# Patient Record
Sex: Male | Born: 1970 | Race: Black or African American | Hispanic: No | Marital: Single | Smoking: Never smoker
Health system: Southern US, Community
[De-identification: ages and names within clinical notes are randomized; demographics above are authoritative.]

## PROBLEM LIST (undated history)

## (undated) DIAGNOSIS — F32A Depression, unspecified: Secondary | ICD-10-CM

## (undated) DIAGNOSIS — I739 Peripheral vascular disease, unspecified: Secondary | ICD-10-CM

## (undated) DIAGNOSIS — G3184 Mild cognitive impairment, so stated: Secondary | ICD-10-CM

## (undated) DIAGNOSIS — N289 Disorder of kidney and ureter, unspecified: Secondary | ICD-10-CM

## (undated) DIAGNOSIS — I10 Essential (primary) hypertension: Secondary | ICD-10-CM

## (undated) DIAGNOSIS — F329 Major depressive disorder, single episode, unspecified: Secondary | ICD-10-CM

## (undated) DIAGNOSIS — I639 Cerebral infarction, unspecified: Secondary | ICD-10-CM

## (undated) HISTORY — PX: ABSCESS DRAINAGE: SHX1119

---
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
## 2001-10-04 ENCOUNTER — Inpatient Hospital Stay (HOSPITAL_COMMUNITY): Admission: EM | Admit: 2001-10-04 | Discharge: 2001-10-06 | Payer: Self-pay | Admitting: Emergency Medicine

## 2001-10-17 ENCOUNTER — Encounter: Admission: RE | Admit: 2001-10-17 | Discharge: 2001-10-17 | Payer: Self-pay | Admitting: Family Medicine

## 2001-11-05 ENCOUNTER — Emergency Department (HOSPITAL_COMMUNITY): Admission: EM | Admit: 2001-11-05 | Discharge: 2001-11-05 | Payer: Self-pay | Admitting: Emergency Medicine

## 2005-02-02 ENCOUNTER — Emergency Department (HOSPITAL_COMMUNITY): Admission: EM | Admit: 2005-02-02 | Discharge: 2005-02-02 | Payer: Self-pay | Admitting: Emergency Medicine

## 2005-05-15 ENCOUNTER — Emergency Department (HOSPITAL_COMMUNITY): Admission: EM | Admit: 2005-05-15 | Discharge: 2005-05-15 | Payer: Self-pay | Admitting: Family Medicine

## 2010-04-21 ENCOUNTER — Inpatient Hospital Stay (HOSPITAL_COMMUNITY)
Admission: EM | Admit: 2010-04-21 | Discharge: 2010-04-25 | DRG: 603 | Disposition: A | Discharge status: Home Health | Payer: Self-pay | Attending: Internal Medicine | Admitting: Internal Medicine

## 2010-04-21 DIAGNOSIS — I10 Essential (primary) hypertension: Secondary | ICD-10-CM | POA: Diagnosis present

## 2010-04-21 DIAGNOSIS — L02419 Cutaneous abscess of limb, unspecified: Principal | ICD-10-CM | POA: Diagnosis present

## 2010-04-21 DIAGNOSIS — E871 Hypo-osmolality and hyponatremia: Secondary | ICD-10-CM | POA: Diagnosis present

## 2010-04-21 DIAGNOSIS — IMO0001 Reserved for inherently not codable concepts without codable children: Secondary | ICD-10-CM | POA: Diagnosis present

## 2010-04-21 DIAGNOSIS — Z6841 Body Mass Index (BMI) 40.0 and over, adult: Secondary | ICD-10-CM

## 2010-04-21 LAB — POCT I-STAT, CHEM 8
BUN: 12 mg/dL (ref 6–23)
Calcium, Ion: 0.99 mmol/L — ABNORMAL LOW (ref 1.12–1.32)
Chloride: 99 mEq/L (ref 96–112)
Glucose, Bld: 257 mg/dL — ABNORMAL HIGH (ref 70–99)
HCT: 46 % (ref 39.0–52.0)
Potassium: 4.6 mEq/L (ref 3.5–5.1)

## 2010-04-21 LAB — GLUCOSE, CAPILLARY

## 2010-04-21 LAB — CBC
HCT: 42.1 % (ref 39.0–52.0)
Hemoglobin: 13.9 g/dL (ref 13.0–17.0)
MCV: 86.4 fL (ref 78.0–100.0)
RBC: 4.87 MIL/uL (ref 4.22–5.81)
RDW: 13.7 % (ref 11.5–15.5)
WBC: 19.1 10*3/uL — ABNORMAL HIGH (ref 4.0–10.5)

## 2010-04-21 LAB — DIFFERENTIAL
Basophils Absolute: 0.1 10*3/uL (ref 0.0–0.1)
Eosinophils Relative: 0 % (ref 0–5)
Lymphocytes Relative: 8 % — ABNORMAL LOW (ref 12–46)
Lymphs Abs: 1.5 10*3/uL (ref 0.7–4.0)
Neutro Abs: 16 10*3/uL — ABNORMAL HIGH (ref 1.7–7.7)
Neutrophils Relative %: 84 % — ABNORMAL HIGH (ref 43–77)

## 2010-04-22 LAB — CBC
HCT: 38.7 % — ABNORMAL LOW (ref 39.0–52.0)
Hemoglobin: 13 g/dL (ref 13.0–17.0)
MCH: 28.7 pg (ref 26.0–34.0)
MCHC: 33.6 g/dL (ref 30.0–36.0)
MCV: 85.4 fL (ref 78.0–100.0)
Platelets: 268 10*3/uL (ref 150–400)
RBC: 4.53 MIL/uL (ref 4.22–5.81)
RDW: 13.9 % (ref 11.5–15.5)
WBC: 24 10*3/uL — ABNORMAL HIGH (ref 4.0–10.5)

## 2010-04-22 LAB — COMPREHENSIVE METABOLIC PANEL
ALT: 18 U/L (ref 0–53)
AST: 29 U/L (ref 0–37)
Albumin: 2.9 g/dL — ABNORMAL LOW (ref 3.5–5.2)
Calcium: 8.3 mg/dL — ABNORMAL LOW (ref 8.4–10.5)
Chloride: 96 mEq/L (ref 96–112)
Creatinine, Ser: 1.23 mg/dL (ref 0.4–1.5)
GFR calc Af Amer: >60 mL/min (ref >60)
Sodium: 131 mEq/L — ABNORMAL LOW (ref 135–145)
Total Bilirubin: 2 mg/dL — ABNORMAL HIGH (ref 0.3–1.2)

## 2010-04-22 LAB — GLUCOSE, CAPILLARY
Glucose-Capillary: 237 mg/dL — ABNORMAL HIGH (ref 70–99)
Glucose-Capillary: 198 mg/dL — ABNORMAL HIGH (ref 70–99)
Glucose-Capillary: 242 mg/dL — ABNORMAL HIGH (ref 70–99)

## 2010-04-23 LAB — BASIC METABOLIC PANEL
CO2: 27 mEq/L (ref 19–32)
Calcium: 8 mg/dL — ABNORMAL LOW (ref 8.4–10.5)
Creatinine, Ser: 1.12 mg/dL (ref 0.4–1.5)
GFR calc Af Amer: >60 mL/min (ref >60)

## 2010-04-23 LAB — CARDIAC PANEL(CRET KIN+CKTOT+MB+TROPI)
CK, MB: 1.5 ng/mL (ref 0.3–4.0)
Relative Index: 1.7 (ref 0.0–2.5)
Total CK: 158 U/L (ref 7–232)
Total CK: 106 U/L (ref 7–232)
Total CK: 133 U/L (ref 7–232)
Troponin I: 0.02 ng/mL (ref 0.00–0.06)
Troponin I: 0.04 ng/mL (ref 0.00–0.06)

## 2010-04-23 LAB — GLUCOSE, CAPILLARY
Glucose-Capillary: 233 mg/dL — ABNORMAL HIGH (ref 70–99)
Glucose-Capillary: 249 mg/dL — ABNORMAL HIGH (ref 70–99)

## 2010-04-23 LAB — DIFFERENTIAL
Basophils Relative: 0 % (ref 0–1)
Eosinophils Absolute: 0.1 10*3/uL (ref 0.0–0.7)
Eosinophils Relative: 0 % (ref 0–5)
Monocytes Absolute: 1.6 10*3/uL — ABNORMAL HIGH (ref 0.1–1.0)
Monocytes Relative: 9 % (ref 3–12)

## 2010-04-23 LAB — CBC
Hemoglobin: 12.1 g/dL — ABNORMAL LOW (ref 13.0–17.0)
MCH: 27.6 pg (ref 26.0–34.0)
MCHC: 32.5 g/dL (ref 30.0–36.0)

## 2010-04-24 LAB — BASIC METABOLIC PANEL
Calcium: 7.9 mg/dL — ABNORMAL LOW (ref 8.4–10.5)
Creatinine, Ser: 1.11 mg/dL (ref 0.4–1.5)
GFR calc Af Amer: >60 mL/min (ref >60)
GFR calc non Af Amer: >60 mL/min (ref >60)
Glucose, Bld: 182 mg/dL — ABNORMAL HIGH (ref 70–99)
Sodium: 134 mEq/L — ABNORMAL LOW (ref 135–145)

## 2010-04-24 LAB — CBC
HCT: 35.5 % — ABNORMAL LOW (ref 39.0–52.0)
MCHC: 32.1 g/dL (ref 30.0–36.0)
RDW: 13.7 % (ref 11.5–15.5)

## 2010-04-24 LAB — GLUCOSE, CAPILLARY: Glucose-Capillary: 218 mg/dL — ABNORMAL HIGH (ref 70–99)

## 2010-04-24 NOTE — Op Note (Signed)
  Adam Vincent, Adam Vincent               ACCOUNT NO.:  [id number]  MEDICAL RECORD NO.:  [id number]           PATIENT TYPE:  I  LOCATION:  5530                         FACILITY:  [facility]  PHYSICIAN:  Isabel Caprice. Hassell Done, MD  DATE OF BIRTH:  09/28/1970  DATE OF PROCEDURE: DATE OF DISCHARGE:                              OPERATIVE REPORT   PREOPERATIVE DIAGNOSIS:  Partially drained abscess of left thigh.  PROCEDURE:  Drainage of deep abscess right thigh.  SURGEON:  Isabel Caprice. Hassell Done, MD  ANESTHESIA:  General by LMA.  DESCRIPTION OF PROCEDURE:  This 40 year old African American man was taken to the OR 17 on the evening of April 22, 2010.  This tender abscess remained with an elevated white count since he was admitted to the ER for diabetes control.  He had an I and D in the ED.  I went ahead and prepped this area and exposed it.  There was only a little area about 1 cm that was effectively opened.  There was an incision but it was not very deep.  It went down into the fascia.  This was extended from medial to lateral, with my finger went in and encountered some foul- smelling Gram-negative type organisms.  The patient is on vancomycin. We will extend it to include some other pathogens broadening the Zosyn. Once I probed this, I did get it well drained, all the areas of induration seemed to be adequately drained.  I then irrigated this with probably 100 mL of peroxide.  I then packed it with half-inch Iodoform gauze.  The patient was awakened, taken to recovery room where he returned to his room.     Isabel Caprice Hassell Done, MD     MBM/MEDQ  D:  04/22/2010  T:  04/23/2010  Job:  [id number]  Electronically Signed by Johnathan Hausen MD on 04/24/2010 [id number] AM

## 2010-04-25 LAB — GLUCOSE, CAPILLARY
Glucose-Capillary: 169 mg/dL — ABNORMAL HIGH (ref 70–99)
Glucose-Capillary: 186 mg/dL — ABNORMAL HIGH (ref 70–99)

## 2010-04-28 LAB — CULTURE, BLOOD (ROUTINE X 2)
Culture  Setup Time: 201204170150
Culture: NO GROWTH

## 2010-05-03 NOTE — Discharge Summary (Signed)
[name], [name]               ACCOUNT NO.:  [id number]  MEDICAL RECORD NO.:  [id number]           PATIENT TYPE:  I  LOCATION:  [id number]                         FACILITY:  [facility]  PHYSICIAN:  Geraldine Solar, MD     DATE OF BIRTH:  Sep 16, 1970  DATE OF ADMISSION:  04/21/2010 DATE OF DISCHARGE:                              DISCHARGE SUMMARY   FINAL DISCHARGE DIAGNOSES: 1. Right thigh abscess status post incision and drainage. 2. Newly diagnosed diabetes mellitus type 2. 3. Newly diagnosed hypertension. 4. Morbid obesity.  CONSULTATIONS:  [facility].  PROCEDURES:  Incision and drainage of deep right thigh abscess done on April 23, 2010.  RADIOLOGY/IMAGING:  None.  BRIEF ADMITTING HISTORY:  Please refer to H and P for more details.  On summary, [name] is a 40 year old African American morbidly obese man with no significant past medical history presented to the ER with chief complaint of left upper thigh abscess.  HOSPITAL COURSE: 1. The patient had incision and drainage done in the ED.  He was found     to have significant leukocytosis of 19,000.  The patient was     admitted to the medical floor with a diagnosis of right thigh     abscess, he was started on vancomycin and Zosyn.  Blood cultures     were sent x2 and remained sterile.  The patient remained afebrile.     [facility] was consulted.  The patient     underwent incision and drainage of the right thigh abscess.  The     patient was also followed by Wound Care team during this     hospitalization.  We will transition his antibiotic to Bactrim DS     as per CCS recommendations for a total of 2 weeks and we will     arrange for outpatient home health and wound care. 2. Newly diagnosed diabetes mellitus type 2.  The patient was noted to     be hyperglycemic on presentation.  His hemoglobin A1c was checked,     that confirmed the presence of diabetes, hemoglobin A1c was 10.5.   The patient was initially started on Lantus insulin with insulin     sliding scale coverage and however his CBG continued to remain     elevated.  I added metformin 500 mg b.i.d. and he is also on Lantus     15 units subcu insulin sliding scale.  He will need further     monitoring of CBG with further adjustment of his regimen.  The     patient was seen by diabetic team educator during this     hospitalization and we will request final recommendation prior to     discharge regarding CBG monitoring and glucometer for the patient     and teaching. 3. Newly-diagnosed hypertension.  The patient was initially started on     hydrochlorothiazide and amlodipine was also added for blood     pressure control.  However, he developed hyponatremia with     hydrochlorothiazide that was subsequently discontinued and switched     to  lisinopril.  He will need also monitoring for his blood pressure     for any further adjustment of his regimen.  His blood pressure is     currently fairly controlled.  DISCHARGE MEDICATIONS: 1. Amlodipine 10 mg p.o. daily. 2. NovoLog insulin 1-15 units subcutaneously 3 times a day with meals     as per scale.  We will provide the patient with hospital scale for     use at home. 3. Lisinopril 5 mg p.o. daily. 4. Aspirin 81 mg p.o. daily. 5. Metformin 500 mg p.o. b.i.d. 6. Lantus insulin 15 units subcutaneously at bedtime. 7. Bactrim DS 160/800 mg tablets 1 tablet p.o. b.i.d. for 14 days as     per CCS recommendation. 8. Insulin syringes t.i.d. p.r.n. and I will also order glucometer as     well.  DISCHARGE INSTRUCTIONS: 1. The patient instructed on taking above medications as prescribed. 2. CBG monitoring and adjustment of his regimen for diabetes as     needed. 3. Blood pressure checks at home and adjustment of his medicine as per     his PCP. 4. The patient have advanced home care arranged and I discussed with     case manager to make sure he will have close  monitoring for his     diabetes medications and blood pressure as well as wound care. 5. The patient has no PCP on presentation, however, case manager     arranged for him to have a PCP with [facility] on discharge and     she already made an appointment for him for May 20, 2010, at 3 p.m. 6. The patient will also follow with [facility] as     scheduled.  CONDITION ON DISCHARGE:  Stable.          ______________________________ Geraldine Solar, MD     SA/MEDQ  D:  04/25/2010  T:  04/25/2010  Job:  [id number]  cc:   [facility]  Electronically Signed by Shellee Milo MD on 05/03/2010 08:00:24 PM

## 2010-05-03 NOTE — H&P (Signed)
Adam Vincent, Adam Vincent               ACCOUNT NO.:  [id number]  MEDICAL RECORD NO.:  [id number]           PATIENT TYPE:  E  LOCATION:  [facility]                         FACILITY:  [facility]  PHYSICIAN:  Geraldine Solar, MD     DATE OF BIRTH:  1970-12-17  DATE OF ADMISSION:  04/21/2010 DATE OF DISCHARGE:                             HISTORY & PHYSICAL   PRIMARY CARE PHYSICIAN:  Unassigned.  CODE STATUS:  Full code.  CHIEF COMPLAINT:  Left upper thigh abscesses.  HISTORY OF PRESENT ILLNESS:  [name] is a 40 year old African American man morbidly obese with no known past medical history presented to the ER with chief complaint of left upper inner thigh abscess this morning.  As per him, he noticed a knot in his upper inner thigh 2 days ago and he squeezed it at that time and then today he started having a lot of pain and swelling in that area and his girlfriend poked the area  resulting in discharge of large amount of pus and blood and he decided to come to the ER for further management.  In the ED, the patient was found to have low-grade temperature of 99.3 and a leukocytosis of 19k WBCs.  The patient had I&D done in the ER and we are asked to admit him for further management for his cellulitis.  He was also noted to be hyperglycemic and hypertensive.  PAST MEDICAL HISTORY:  As per patient none.  No medical history.  He was told in the past that he had diabetes once, however, he was seen by another doctor who told him he does not have diabetes.  Also as per him, his blood pressure was running high on and off in the past but he was not prescribed any medicine for diabetes or hypertension.  ALLERGIES:  No known drug allergies.  HOME MEDICATIONS:  None.  SOCIAL HISTORY:  Works in [employer]  Lives with a girlfriend, has 4 kids.  Denies smoking, drinks socially, and smokes marijuana seldom.  FAMILY HISTORY:  He denies any family history for diabetes or hypertension.  REVIEW  OF SYSTEMS:  As above in HPI.  All other systems reported negative.  He denies any fever or chills.  Denies any cough, shortness of breath, chest pain, abdominal pain, nausea or vomiting or change in his bowel habits.  NEURO:  Denies any motor weakness, numbness, headaches.  GU:  Denies any dysuria, flank pain, or increasing urinary frequency.  PHYSICAL EXAMINATION:  VITAL SIGNS:  Blood pressure 167/119, pulse 108, temperature 99.3, O2 sat 99% on room air. GENERAL:  He is alert, oriented x3, morbidly obese African American man. NECK:  Supple.  No JVD. LUNGS:  Showed decreased breathing sounds.  I did not appreciate any rales or wheezing. CARDIOVASCULAR:  S1, S2.  Regular rhythm and rate. ABDOMEN:  Obese, soft, nontender. EXTREMITIES:  Showed no pedal edema.  He did have left inner upper thigh cellulitis.  He is status post I&D.  Size of the abscess was about 5 cm long.  He had packing in place.  He had surrounding erythema and tenderness and induration  of the skin.  LABORATORY DATA:  White blood count 19.1, hemoglobin 15.9, hematocrit 42.1, platelet count 242,000.  His potassium is 4.6.  Sodium 133, BUN 12, creatinine 1.2, and his glucose is 257.  ASSESSMENT/PLAN:  A 40 year old man with no significant past medical history with reported occasional hyperglycemia and hypertension in the past, not prescribed any medication, he is not followed by a PCP presented with left upper inner thigh abscess and cellulitis.  PLAN: 1. The patient will be admitted to the regular medical floor. 2. Blood cultures x2. 3. We will cover him with vancomycin and Zosyn. 4. I will order hemoglobin A1c and place him on insulin sliding scale     for his hyperglycemia and based on that he will be initiated on     medication if needed based on his hemoglobin A1c and blood glucose     level. 5. I will start him on hydrochlorothiazide for hypertension.  He will     probably need at least 2 medications for his  hypertension.  We will     start with hydrochlorothiazide and add another agent if needed. 6. Wound care for his I&D site. 7. The patient counseled on diet and lifestyle modification and weight     loss. 8. Full code. 9. The patient will need PCP to be assigned to him on discharge for     followup on his medical problems.          ______________________________ Geraldine Solar, MD     SA/MEDQ  D:  04/21/2010  T:  04/21/2010  Job:  [id number]  Electronically Signed by Shellee Milo MD on 05/03/2010 08:00:54 PM

## 2010-05-08 NOTE — Consult Note (Signed)
[name], Vincent               ACCOUNT NO.:  [id number]  MEDICAL RECORD NO.:  [id number]           PATIENT TYPE:  I  LOCATION:  5530                         FACILITY:  [facility]  PHYSICIAN:  Adam Caprice. Hassell Done, MD  DATE OF BIRTH:  09-Oct-1970  DATE OF CONSULTATION:  04/22/2010 DATE OF DISCHARGE:                                CONSULTATION   REQUESTING PHYSICIAN:  Niel Hummer, MD  CONSULTING SURGEON:  Adam Caprice. Hassell Done, MD  PRIMARY CARE PHYSICIAN:  Unassigned.  REASON FOR CONSULTATION:  Left thigh abscess.  HISTORY OF PRESENT ILLNESS:  [name] is a pleasant 40 year old African American gentleman with no previously known or treated medical history who states about 4-5 days ago noticed a little bump early beginnings of a boil on his left medial thigh.  He states he squeezed this at home, but did not achieve any drainage initially, but however then his girlfriend apparently poked the area with a needle resulting in some purulent drainage.  He felt a little woozy and dizzy and therefore was brought to the emergency department for evaluation.  He was seen in the emergency department yesterday and subsequently found to have a white blood cell count of 19,000.  The emergency room staff proceeded with a larger incision and drainage of the abscess cavity and packed this with sterile gauze.  However, he was also found to have new onset hypertension and uncontrolled diabetes.  Therefore, the decision was made that the patient need to be admitted for IV antibiotics and initial management of these other findings.  However, since the patient has been admitted, he is continued to remain febrile and his white blood cell count has gone from 19,000-24,000.  The amount of swelling in the incision and drainage or abscess area has increased in size as well, as well as his pain.  Therefore, the Hospitalist Service is asked for surgical consultation as there is concern for need for a  larger debridement.  The patient denies any previous history of boils or abscesses that needed hospitalization in the past.  Again to his knowledge, he has not had any chronic medical conditions until diagnosed upon this admission.  He denies any chest pain or shortness of breath.  He denies any abdominal pain or changes in his bowel function.  He denies any dysuria or hematuria.  PAST MEDICAL HISTORY: 1. New onset hypertension. 2. Diabetes mellitus. 3. Morbid obesity.  PAST SURGICAL HISTORY:  Negative.  FAMILY HISTORY:  Noncontributory to the present case.  SOCIAL HISTORY:  The patient denies tobacco use.  He has an occasional alcoholic beverage and has an occasional use of marijuana.  He lives at home with his girlfriend.  He does have several children, but again is not married.  MEDICATIONS:  None chronic prior to this admission.  ALLERGIES:  No known drug or latex allergies.  REVIEW OF SYSTEMS:  Please see history present illness for pertinent findings, otherwise complete 10 systems are review and found to be negative.  PHYSICAL EXAMINATION:  GENERAL APPEARANCE:  A morbidly obese African American male who is in no acute distress. VITAL SIGNS:  Current temperature  of 100.7, heart rate of 107, blood pressure of 169/94, respiratory rate of 22, and oxygen saturation 100%. ENT:  Unremarkable. NECK:  Supple without lymphadenopathy.  Trachea is midline.  No thyromegaly or masses. LUNGS:  Clear to auscultation.  No wheezes, rhonchi, or rales.  Normal respiratory effort without use of accessory muscles. HEART:  Regular rate and rhythm.  No murmurs, gallops, or rubs. Carotids are 2+ and brisk without bruits.  Peripheral pulses are intact and symmetrical. ABDOMEN:  Soft, flat, and nontender.  No mass effect, hernias, or organomegaly is appreciated.  No surgical scars are seen. RECTAL:  Deferred. GENITOURINARY:  Deferred. MUSCULOSKELETAL:  The patient has good active range of  motion of all extremities without crepitus or pain.  No muscle strength and tone without atrophy. SKIN:  Otherwise normal with the exception of the left medial thigh, focalized area of significant swelling and induration as well as overlying erythema and tenderness.  There is evidence of central fluctuance with a purulent material expressed from the existing wound from the previously mentioned incision and drainage.  He is otherwise neurovascularly intact. NEUROLOGIC:  The patient is alert and oriented x3.  DIAGNOSTICS:  CBC today shows a white blood cell count has risen to 24.0, hemoglobin of 13.0, hematocrit of 38.7, and platelet count of 268. Metabolic panel shows a sodium of 131, potassium of 4.5, chloride of 96, CO2 of 28, BUN of 10, creatinine of 1.2, and glucose of 262.  Hemoglobin A1c elevated at 10.5.  No imaging has been performed.  IMPRESSION: 1. Left thigh abscess. 2. Diabetes mellitus. 3. Hypertension. 4. Morbid obesity.  PLAN:  I discussed with the patient as well as the attending physician, Dr. Tyrell Antonio that this abscess cavity in the left medial thigh needs further incision and drainage in an operative setting.  There would be some concern for possible early necrotizing fasciitis, though there does not appear to be any evidence at present time.  However, tissues will be examined in the operative setting.  The need for the treatment including the procedure itself and the risks were explained to the patient in detail.  He understands and is agreeable to procedure. We will otherwise recommend continuation of antibiotics and strict glycemic control.     Adam Dike, PA-C   ______________________________ Adam Caprice Hassell Done, MD    KB/MEDQ  D:  04/22/2010  T:  04/23/2010  Job:  [id number]  Electronically Signed by Adam Vincent  on 04/28/2010 02:11:03 PM Electronically Signed by Johnathan Hausen MD on 05/08/2010 08:42:20 AM

## 2010-05-23 NOTE — Consult Note (Signed)
NAMEADEIN, TENOLD                         ACCOUNT NO.:  [id number]   MEDICAL RECORD NO.:  [id number]                   PATIENT TYPE:  INP   LOCATION:  5016                                 FACILITY:  [facility]   PHYSICIAN:  Merri Ray. Grandville Silos, [name]             DATE OF BIRTH:  02-11-1970   DATE OF CONSULTATION:  DATE OF DISCHARGE:  10/06/2001                                   CONSULTATION   REASON FOR CONSULTATION:  Abdominal wall cellulitis, rule out abscess.   HISTORY OF PRESENT ILLNESS:  The patient is a 40 year old African-American  male without a significant past medical history who presented to the  hospital on 9/29 with two day history of lower abdominal swelling and  tenderness in the right lower portion of his pannus. He did notice some  mucus draining from a central portion of the area the day prior to  admission. He felt the area was getting quite a bit smaller than at its  largest point and was getting a little bit better, but it was still quite  tender, and he had a fever at home, and he came in for admission. Currently,  the patient says the swelling in the area has gotten significantly better in  the smaller area and has had a small amount of drainage from a central area  which he describes like a scab.   PAST MEDICAL HISTORY:  Negative.   PAST SURGICAL HISTORY:  He denies.   MEDICATIONS:  None at home. He is taking nafcillin IV for this problem.   FAMILY HISTORY:  His mother is alive and well. His father died at age 38  from prostate cancer and did have a history of hypertension.   SOCIAL HISTORY:  The patient denies tobacco and alcohol and illicit drugs  but does admit to some unprotected sex. He is single but has three children.   REVIEW OF SYMPTOMS:  He had fevers recently with chills at home but has felt  better since. PULMONARY:  He had no shortness of breath. CARDIOVASCULAR:  He  has no chest pain. GASTROINTESTINAL:  He complained of some mild nausea on  admission, but he says that is better. He is eating today. He had no  diarrhea or vomiting. GENITOURINARY:  He has no urinary problems. His review  of systems is otherwise negative.   PHYSICAL EXAMINATION:  VITAL SIGNS:  Today, his temperature is 97.7, pulse  77, respirations 20, blood pressure 123/65.  GENERAL:  He is awake and alert in no acute distress. He is a pleasant  gentleman. He is quite obese.  LUNGS:  Clear to auscultation.  HEART:  Regular.  ABDOMEN:  Superiorly is soft, but inferiorly, he has about an 18-cm area of  induration, with some erythema although the erythema does not extend out to  the place where it appears to have previously been marked with a pen line.  There is  a central 1.5-cm excoriation area with a small amount of yellow  drainage from there. The indurated area is tender to palpation but not  exquisitely so. The rest of his abdomen is soft and nontender.  EXTREMITIES:  Warm.   LABORATORY DATA:  Today, his white is 13.7, hemoglobin 12.7, hematocrit  38.8, platelets 265,000. Sodium 134, potassium 3.8, chloride 100, CO2 28,  BUN 9, creatinine 0.9, sugar 228. His  bilirubin is 0.8, AST 23, ALT 31,  alkaline phosphatase 68.   IMPRESSION:  Abdominal wall cellulitis with possible residual abscess.   PLAN:  The plan will be to do a local bedside incision and drainage of the  area to see if there is any residual pus collection. This plan was discussed  with the patient, and he agrees to proceed. Otherwise, continue the IV  antibiotics, and I will follow the patient along with you.                                               Merri Ray Grandville Silos, [name]    BET/MEDQ  D:  10/04/2001  T:  10/07/2001  Job:  [id number]

## 2010-05-23 NOTE — Discharge Summary (Signed)
Adam Vincent, Adam Vincent                         ACCOUNT NO.:  [id number]   MEDICAL RECORD NO.:  [id number]                   PATIENT TYPE:  INP   LOCATION:  5016                                 FACILITY:  [place]   PHYSICIAN:  Sallyanne Havers, [name]                 DATE OF BIRTH:  1970/03/14   DATE OF ADMISSION:  10/03/2001  DATE OF DISCHARGE:                                 DISCHARGE SUMMARY   ADMISSION DIAGNOSIS:  Abdominal abscess with cellulitis.   DISCHARGE DIAGNOSES:  1. Abdominal cellulitis.  2. Diabetes mellitus type 2.   ATTENDING PHYSICIAN:  Marshall L. Erin Hearing, [name]   RESIDENT:  Sallyanne Havers, [name]   PROCEDURE:  Tried to I&D, but said that there was no appreciable abscess.   CONSULTATIONS:  Surgery.   HISTORY OF PRESENT ILLNESS:  This 40 year old African-American male with no  significant past medical history presented with a two-day history of lower  abdominal swelling and tenderness.  He began to drain mucoid fluid and said  that he had two days of a fever to 102 degrees.  He denied any other  complaints at that time.  His temperature on admission was 101.1 degrees,  blood pressure 135/94, pulse 116, respirations 20.   LABORATORY DATA:  White blood cell count 16.4, hemoglobin 12.8, hematocrit  38.6.  CMP was within normal limits except for a blood sugar of 234.   HOSPITAL COURSE:  #1 - ABDOMINAL CELLULITIS:  Upon admission the patient was  started on IV Nafcillin, and surgery was consulted.  On hospital day number  two surgery came to I&D the abscess, but did not find an appreciable  abscess, and thought it had drained on its own.  The patient remained  afebrile for greater than 48 hours on the day of discharge, and was changed  to p.o. Keflex 500 mg q.i.d.  Blood culture was positive for gram-positive  cocci in clusters and the ID had not come back for which bacteria.  The  patient will continue a total of 10 days of Keflex, to cover for the  infection, and was  given discharge instructions on how to care for the  wound.  #2 - DIABETES MELLITUS TYPE 2, NEW DIAGNOSIS:  The patient was started on a  sliding scale of insulin, and only required 12 units once, when he had a  blood sugar of 254 on hospital day number two.  He was started on Glucophage  500 mg q.d., and his blood sugars remained in the 100s overnight and on the  day of discharge.  He did not require any more insulin.  That was  discontinued.  He reports that he has no history of diabetes mellitus, and  he will require followup.  He is given a Glucometer upon discharge, with  instructions, and is asked to check his blood sugar twice a day.  He will  need followup with a  nutritionist for more education.  His hemoglobin A1c  was 9.1.   DISCHARGE MEDICATIONS:  1. Keflex 500 mg q.i.d. x8 days, for a total of 10 days.  2. Glucophage 500 mg q.d. with meal.   DISPOSITION:  The patient is discharged to home.   FOLLOW UP:  The patient is instructed to call the [facility] and  make an appointment with Dr. Priscella Mann in one to two weeks, to follow up for  his diabetes.                                                Sallyanne Havers, [name]    AS/MEDQ  D:  10/06/2001  T:  10/10/2001  Job:  [id number]

## 2011-02-09 ENCOUNTER — Encounter (HOSPITAL_COMMUNITY): Payer: Self-pay | Admitting: *Deleted

## 2011-02-09 ENCOUNTER — Emergency Department (HOSPITAL_COMMUNITY): Payer: Self-pay

## 2011-02-09 ENCOUNTER — Emergency Department (HOSPITAL_COMMUNITY)
Admission: EM | Admit: 2011-02-09 | Discharge: 2011-02-09 | Disposition: A | Discharge status: Home / Self Care | Payer: Self-pay | Attending: Emergency Medicine | Admitting: Emergency Medicine

## 2011-02-09 DIAGNOSIS — E119 Type 2 diabetes mellitus without complications: Secondary | ICD-10-CM | POA: Insufficient documentation

## 2011-02-09 DIAGNOSIS — N210 Calculus in bladder: Secondary | ICD-10-CM | POA: Insufficient documentation

## 2011-02-09 DIAGNOSIS — R109 Unspecified abdominal pain: Secondary | ICD-10-CM | POA: Insufficient documentation

## 2011-02-09 DIAGNOSIS — N23 Unspecified renal colic: Secondary | ICD-10-CM | POA: Insufficient documentation

## 2011-02-09 DIAGNOSIS — R10819 Abdominal tenderness, unspecified site: Secondary | ICD-10-CM | POA: Insufficient documentation

## 2011-02-09 DIAGNOSIS — N2889 Other specified disorders of kidney and ureter: Secondary | ICD-10-CM | POA: Insufficient documentation

## 2011-02-09 DIAGNOSIS — R110 Nausea: Secondary | ICD-10-CM | POA: Insufficient documentation

## 2011-02-09 HISTORY — DX: Essential (primary) hypertension: I10

## 2011-02-09 LAB — URINALYSIS, ROUTINE W REFLEX MICROSCOPIC
Bilirubin Urine: NEGATIVE
Ketones, ur: 15 mg/dL — AB
Leukocytes, UA: NEGATIVE
Nitrite: NEGATIVE
Protein, ur: 30 mg/dL — AB

## 2011-02-09 LAB — DIFFERENTIAL
Basophils Absolute: 0 10*3/uL (ref 0.0–0.1)
Basophils Relative: 0 % (ref 0–1)
Eosinophils Absolute: 0 10*3/uL (ref 0.0–0.7)
Eosinophils Relative: 0 % (ref 0–5)
Monocytes Absolute: 1.1 10*3/uL — ABNORMAL HIGH (ref 0.1–1.0)

## 2011-02-09 LAB — COMPREHENSIVE METABOLIC PANEL
ALT: 17 U/L (ref 0–53)
Albumin: 3.6 g/dL (ref 3.5–5.2)
Alkaline Phosphatase: 66 U/L (ref 39–117)
BUN: 19 mg/dL (ref 6–23)
Calcium: 9.3 mg/dL (ref 8.4–10.5)
GFR calc Af Amer: 81 mL/min — ABNORMAL LOW (ref >90)
Potassium: 4.4 mEq/L (ref 3.5–5.1)
Sodium: 134 mEq/L — ABNORMAL LOW (ref 135–145)
Total Protein: 8.3 g/dL (ref 6.0–8.3)

## 2011-02-09 LAB — CBC
MCH: 28.1 pg (ref 26.0–34.0)
MCHC: 33.1 g/dL (ref 30.0–36.0)
MCV: 84.9 fL (ref 78.0–100.0)
Platelets: 256 10*3/uL (ref 150–400)
RDW: 14.1 % (ref 11.5–15.5)
WBC: 16.6 10*3/uL — ABNORMAL HIGH (ref 4.0–10.5)

## 2011-02-09 LAB — LIPASE, BLOOD: Lipase: 65 U/L — ABNORMAL HIGH (ref 11–59)

## 2011-02-09 LAB — URINE MICROSCOPIC-ADD ON

## 2011-02-09 MED ORDER — IOHEXOL 300 MG/ML  SOLN
100.0000 mL | Freq: Once | INTRAMUSCULAR | Status: AC | PRN
  Administered 2011-02-09: 100 mL via INTRAVENOUS

## 2011-02-09 MED ORDER — FENTANYL CITRATE 0.05 MG/ML IJ SOLN
50.0000 ug | INTRAMUSCULAR | Status: DC | PRN
  Administered 2011-02-09: 50 ug via INTRAVENOUS
  Filled 2011-02-09: qty 2

## 2011-02-09 MED ORDER — FAMOTIDINE IN NACL 20-0.9 MG/50ML-% IV SOLN
20.0000 mg | Freq: Once | INTRAVENOUS | Status: AC
  Administered 2011-02-09: 20 mg via INTRAVENOUS
  Filled 2011-02-09: qty 50

## 2011-02-09 MED ORDER — SODIUM CHLORIDE 0.9 % IV SOLN
INTRAVENOUS | Status: DC
  Administered 2011-02-09: 12:00:00 via INTRAVENOUS

## 2011-02-09 MED ORDER — DICYCLOMINE HCL 10 MG PO CAPS
20.0000 mg | ORAL_CAPSULE | ORAL | Status: AC
  Administered 2011-02-09: 20 mg via ORAL
  Filled 2011-02-09: qty 2

## 2011-02-09 NOTE — ED Notes (Signed)
Pt returned from CT °

## 2011-02-09 NOTE — ED Notes (Signed)
Pt sts he took 2, 81mg  ASA for pain PTA.

## 2011-02-09 NOTE — ED Notes (Signed)
Attempted IV x2. Unsuccessful. 2nd RN in to attempt.

## 2011-02-09 NOTE — ED Provider Notes (Signed)
History     CSN: [id number]  Arrival date & time 02/09/11  1007   First MD Initiated Contact with Patient 02/09/11 1040      Chief Complaint  Patient presents with  . Abdominal Pain    HPI Pt was seen at 1045.  Per pt, c/o gradual onset and persistence of constant left sided abd "pain" that began this morning PTA.  Has been assoc with nausea.  Denies testicular pain/swelling, no dysuria/hematuria, no flank pain, no vomiting/diarrhea, no fevers, no rash.    Past Medical History  Diagnosis Date  . Diabetes mellitus     History reviewed. No pertinent past surgical history.   History  Substance Use Topics  . Smoking status: Never Smoker   . Smokeless tobacco: Not on file  . Alcohol Use: No    Review of Systems ROS: Statement: All systems negative except as marked or noted in the HPI; Constitutional: Negative for fever and chills. ; ; Eyes: Negative for eye pain, redness and discharge. ; ; ENMT: Negative for ear pain, hoarseness, nasal congestion, sinus pressure and sore throat. ; ; Cardiovascular: Negative for chest pain, palpitations, diaphoresis, dyspnea and peripheral edema. ; ; Respiratory: Negative for cough, wheezing and stridor. ; ; Gastrointestinal: +nausea, abd pain. Negative for vomiting, diarrhea, blood in stool, hematemesis, jaundice and rectal bleeding. . ; ; Genitourinary: Negative for dysuria, flank pain and hematuria.;  Genital:  No penile drainage or rash, no testicular pain or swelling, no scrotal rash or swelling.; ; Musculoskeletal: Negative for back pain and neck pain. Negative for swelling and trauma.; ; Skin: Negative for pruritus, rash, abrasions, blisters, bruising and skin lesion.; ; Neuro: Negative for headache, lightheadedness and neck stiffness. Negative for weakness, altered level of consciousness , altered mental status, extremity weakness, paresthesias, involuntary movement, seizure and syncope.      Allergies  Review of patient's allergies indicates  no known allergies.  Home Medications  No current outpatient prescriptions on file.  BP 195/113  Pulse 74  Temp(Src) 97.4 F (36.3 C) (Oral)  Resp 22  SpO2 100%  Physical Exam 1050: Physical examination:  Nursing notes reviewed; Vital signs and O2 SAT reviewed;  Constitutional: Well developed, Well nourished, Well hydrated, In no acute distress; Head:  Normocephalic, atraumatic; Eyes: EOMI, PERRL, No scleral icterus; ENMT: Mouth and pharynx normal, Mucous membranes moist; Neck: Supple, Full range of motion, No lymphadenopathy; Cardiovascular: Regular rate and rhythm, No murmur, rub, or gallop; Respiratory: Breath sounds clear & equal bilaterally, No rales, rhonchi, wheezes, or rub, Normal respiratory effort/excursion; Chest: Nontender, Movement normal; Abdomen: Soft, +mild tenderness LUQ and LLQ to palp, no rebound or guarding, Nondistended, Normal bowel sounds; Genitourinary: No CVA tenderness; Extremities: Pulses normal, No tenderness, No edema, No calf edema or asymmetry.; Neuro: AA&Ox3, Major CN grossly intact.  No gross focal motor or sensory deficits in extremities.; Skin: Color normal, Warm, Dry, no rash.    ED Course  Procedures   MDM  MDM Reviewed: nursing note and vitals Interpretation: labs and CT scan   Results for orders placed during the hospital encounter of 02/09/11  CBC      Component Value Range   WBC 16.6 (*) 4.0 - 10.5 (K/uL)   RBC 5.23  4.22 - 5.81 (MIL/uL)   Hemoglobin 14.7  13.0 - 17.0 (g/dL)   HCT 44.4  39.0 - 52.0 (%)   MCV 84.9  78.0 - 100.0 (fL)   MCH 28.1  26.0 - 34.0 (pg)   MCHC 33.1  30.0 - 36.0 (g/dL)   RDW 14.1  11.5 - 15.5 (%)   Platelets 256  150 - 400 (K/uL)  DIFFERENTIAL      Component Value Range   Neutrophils Relative 83 (*) 43 - 77 (%)   Neutro Abs 13.7 (*) 1.7 - 7.7 (K/uL)   Lymphocytes Relative 10 (*) 12 - 46 (%)   Lymphs Abs 1.7  0.7 - 4.0 (K/uL)   Monocytes Relative 7  3 - 12 (%)   Monocytes Absolute 1.1 (*) 0.1 - 1.0 (K/uL)    Eosinophils Relative 0  0 - 5 (%)   Eosinophils Absolute 0.0  0.0 - 0.7 (K/uL)   Basophils Relative 0  0 - 1 (%)   Basophils Absolute 0.0  0.0 - 0.1 (K/uL)  URINALYSIS, ROUTINE W REFLEX MICROSCOPIC      Component Value Range   Color, Urine YELLOW  YELLOW    APPearance CLEAR  CLEAR    Specific Gravity, Urine 1.022  1.005 - 1.030    pH 7.0  5.0 - 8.0    Glucose, UA >1000 (*) NEGATIVE (mg/dL)   Hgb urine dipstick SMALL (*) NEGATIVE    Bilirubin Urine NEGATIVE  NEGATIVE    Ketones, ur 15 (*) NEGATIVE (mg/dL)   Protein, ur 30 (*) NEGATIVE (mg/dL)   Urobilinogen, UA 0.2  0.0 - 1.0 (mg/dL)   Nitrite NEGATIVE  NEGATIVE    Leukocytes, UA NEGATIVE  NEGATIVE   URINE MICROSCOPIC-ADD ON      Component Value Range   WBC, UA 0-2  <3 (WBC/hpf)   RBC / HPF 7-10  <3 (RBC/hpf)   Bacteria, UA RARE  RARE   COMPREHENSIVE METABOLIC PANEL      Component Value Range   Sodium 134 (*) 135 - 145 (mEq/L)   Potassium 4.4  3.5 - 5.1 (mEq/L)   Chloride 98  96 - 112 (mEq/L)   CO2 27  19 - 32 (mEq/L)   Glucose, Bld 214 (*) 70 - 99 (mg/dL)   BUN 19  6 - 23 (mg/dL)   Creatinine, Ser 1.27  0.50 - 1.35 (mg/dL)   Calcium 9.3  8.4 - 10.5 (mg/dL)   Total Protein 8.3  6.0 - 8.3 (g/dL)   Albumin 3.6  3.5 - 5.2 (g/dL)   AST 17  0 - 37 (U/L)   ALT 17  0 - 53 (U/L)   Alkaline Phosphatase 66  39 - 117 (U/L)   Total Bilirubin 0.3  0.3 - 1.2 (mg/dL)   GFR calc non Af Amer 70 (*) >90 (mL/min)   GFR calc Af Amer 81 (*) >90 (mL/min)  LIPASE, BLOOD      Component Value Range   Lipase 65 (*) 11 - 59 (U/L)    Ct Abdomen Pelvis W Contrast 02/09/2011  *RADIOLOGY REPORT*  Clinical Data: Left-sided abdominal pain.  Nausea.  Elevated white count.  CT ABDOMEN AND PELVIS WITH CONTRAST  Technique:  Multidetector CT imaging of the abdomen and pelvis was performed following the standard protocol during bolus administration of intravenous contrast.  Contrast: 124mL OMNIPAQUE IOHEXOL 300 MG/ML IV SOLN  Comparison: None.  Findings: Lung  bases are clear.  No pleural or pericardial fluid.  The liver shows mild diffuse fatty change.  No focal lesion.  I question if there could be a small gallstone.  This is not definite.  The spleen is normal.  The pancreas is normal.  The adrenal glands are normal.  The right kidney is normal.  The left  kidney shows mild swelling with peri nephric edema.  No evidence of renal mass, cyst, stone or pronounced hydronephrosis.  The left ureter is slightly prominent.  No stone in the ureter at this time. There is a 2 mm stone in the bladder at the entrance to the urethra.  The aorta and IVC are normal.  No retroperitoneal mass or adenopathy.  No free intraperitoneal fluid or air.  Normal appearing appendix.  Prostate gland seminal vesicles are unremarkable.  No significant/acute bony finding.  There are changes of degenerative disc disease most notable at L3-4 and L4-5.  IMPRESSION: Mild swelling of the left kidney with mild perinephric edema.  Mild fullness of the left ureter.  2 mm stone in the bladder at the entrance to the prostatic urethra.  Presumably, this pattern of findings indicates a recently passed left ureteral stone.  One could not exclude the possibility of pyelonephritis on the left given the history.  Original Report Authenticated By: Jules Schick, [name]    2:32 PM:  Pt states he feels completely improved and wants to go home now.  Has been roughhousing with several small children in the exam room, NAD.  VSS, afebrile.  WBC and lipase elevated but non-specific with normal LFT's and NO RUQ discomfort to palpation.  No CVAT to suggest pyelonephritis, no UTI on Udip.  UC is pending.  Known hx of DM and HTN.  Dx testing d/w pt and family.  Questions answered.  Verb understanding, agreeable to d/c home with outpt f/u.         [place], DO 02/11/11 1717

## 2011-02-09 NOTE — ED Notes (Signed)
Pt reports sudden onset of throbbing pain in LUQ beginning at 7am today. A little bit of nausea. Denies v/d. Denies urinary symptoms. Denies acid reflux.

## 2011-02-10 LAB — URINE CULTURE

## 2011-08-04 ENCOUNTER — Encounter (HOSPITAL_COMMUNITY): Payer: Self-pay | Admitting: *Deleted

## 2011-08-04 ENCOUNTER — Emergency Department (HOSPITAL_COMMUNITY): Payer: Self-pay

## 2011-08-04 ENCOUNTER — Emergency Department (HOSPITAL_COMMUNITY)
Admission: EM | Admit: 2011-08-04 | Discharge: 2011-08-04 | Disposition: A | Discharge status: Home / Self Care | Payer: Self-pay | Attending: Emergency Medicine | Admitting: Emergency Medicine

## 2011-08-04 DIAGNOSIS — I10 Essential (primary) hypertension: Secondary | ICD-10-CM | POA: Insufficient documentation

## 2011-08-04 DIAGNOSIS — G510 Bell's palsy: Secondary | ICD-10-CM | POA: Insufficient documentation

## 2011-08-04 DIAGNOSIS — Z79899 Other long term (current) drug therapy: Secondary | ICD-10-CM | POA: Insufficient documentation

## 2011-08-04 DIAGNOSIS — E119 Type 2 diabetes mellitus without complications: Secondary | ICD-10-CM | POA: Insufficient documentation

## 2011-08-04 MED ORDER — LISINOPRIL 40 MG PO TABS: 20.0000 mg | ORAL_TABLET | Freq: Every day | ORAL | Status: DC

## 2011-08-04 MED ORDER — METFORMIN HCL 1000 MG PO TABS: 1000.0000 mg | ORAL_TABLET | Freq: Two times a day (BID) | ORAL | Status: DC

## 2011-08-04 MED ORDER — CLONIDINE HCL 0.1 MG PO TABS
0.2000 mg | ORAL_TABLET | Freq: Once | ORAL | Status: AC
  Administered 2011-08-04: 0.2 mg via ORAL
  Filled 2011-08-04: qty 2

## 2011-08-04 MED ORDER — PREDNISONE 10 MG PO TABS: 20.0000 mg | ORAL_TABLET | Freq: Every day | ORAL | Status: DC

## 2011-08-04 NOTE — ED Notes (Signed)
Pt transported to CT ?

## 2011-08-04 NOTE — ED Notes (Signed)
MD aware of pt's blood pressure.  

## 2011-08-04 NOTE — ED Notes (Signed)
Pt from home with reports of left sided facial/tongue numbness as well as left sided headache that started on Sunday with no improvement. Pt noted to have left sided facial droop but denies difficulty with speech or weakness in arms or legs.

## 2011-08-04 NOTE — ED Provider Notes (Signed)
History     CSN: [id number]  Arrival date & time 08/04/11  0929   First MD Initiated Contact with Patient 08/04/11 [phone]      Chief Complaint  Patient presents with  . Numbness    Left side of face     HPI Pt from home with reports of left sided facial/tongue numbness as well as left sided headache that started on Sunday with no improvement. Pt noted to have left sided facial droop but denies difficulty with speech or weakness in arms or legs.  Patient's states that he supposed to be on blood pressure medicine but has not taken it for several days because he's out of it.  Patient does not have a local doctor.  Past Medical History  Diagnosis Date  . Diabetes mellitus   . Hypertension     Past Surgical History  Procedure Date  . Abscess drainage     Left leg    History reviewed. No pertinent family history.  History  Substance Use Topics  . Smoking status: Never Smoker   . Smokeless tobacco: Never Used  . Alcohol Use: No      Review of Systems  All other systems reviewed and are negative.    Allergies  Review of patient's allergies indicates no known allergies.  Home Medications   Current Outpatient Rx  Name Route Sig Dispense Refill  . B COMPLEX VITAMINS PO CAPS Oral Take 2 capsules by mouth daily.    Marland Kitchen BAYER ASPIRIN PO Oral Take 2 tablets by mouth every 6 (six) hours as needed. For pain    . L-CARNITINE PO Oral Take 1 tablet by mouth daily.    Marland Kitchen LISINOPRIL 40 MG PO TABS Oral Take 0.5 tablets (20 mg total) by mouth daily. 30 tablet 0  . METFORMIN HCL 1000 MG PO TABS Oral Take 1 tablet (1,000 mg total) by mouth 2 (two) times daily. 30 tablet 0  . PREDNISONE 10 MG PO TABS Oral Take 2 tablets (20 mg total) by mouth daily. 15 tablet 0    BP 137/73  Pulse 59  Temp 98 F (36.7 C) (Oral)  Resp 16  Wt 390 lb (176.903 kg)  SpO2 100%  Physical Exam  Nursing note and vitals reviewed. Constitutional: He is oriented to person, place, and time. He appears  well-developed. No distress.  HENT:  Head: Normocephalic and atraumatic.  Eyes: Pupils are equal, round, and reactive to light.  Neck: Normal range of motion.  Cardiovascular: Normal rate and intact distal pulses.   Pulmonary/Chest: No respiratory distress.  Abdominal: Normal appearance. He exhibits no distension.  Musculoskeletal: Normal range of motion.  Neurological: He is alert and oriented to person, place, and time. He has normal strength. A cranial nerve deficit is present. He displays a negative Romberg sign. GCS eye subscore is 4. GCS verbal subscore is 5. GCS motor subscore is 6.       Facial nerve palsy including forehead and lower face.  Patient has inability to close left eyelid.  Decreased lacrimation.  Skin: Skin is warm and dry. No rash noted.  Psychiatric: He has a normal mood and affect. His behavior is normal.    ED Course  Procedures (including critical care time)  Labs Reviewed - No data to display No results found.   1. Bell palsy   2. Hypertension       MDM          Dot Lanes, MD 08/08/11 2225

## 2012-10-07 ENCOUNTER — Encounter (HOSPITAL_COMMUNITY): Payer: Self-pay | Admitting: Emergency Medicine

## 2012-10-07 ENCOUNTER — Emergency Department (HOSPITAL_COMMUNITY)
Admission: EM | Admit: 2012-10-07 | Discharge: 2012-10-07 | Disposition: A | Discharge status: Home / Self Care | Payer: Self-pay | Attending: Emergency Medicine | Admitting: Emergency Medicine

## 2012-10-07 DIAGNOSIS — I10 Essential (primary) hypertension: Secondary | ICD-10-CM | POA: Insufficient documentation

## 2012-10-07 DIAGNOSIS — Z79899 Other long term (current) drug therapy: Secondary | ICD-10-CM | POA: Insufficient documentation

## 2012-10-07 DIAGNOSIS — L03011 Cellulitis of right finger: Secondary | ICD-10-CM

## 2012-10-07 DIAGNOSIS — Z7982 Long term (current) use of aspirin: Secondary | ICD-10-CM | POA: Insufficient documentation

## 2012-10-07 DIAGNOSIS — E119 Type 2 diabetes mellitus without complications: Secondary | ICD-10-CM | POA: Insufficient documentation

## 2012-10-07 DIAGNOSIS — Z792 Long term (current) use of antibiotics: Secondary | ICD-10-CM | POA: Insufficient documentation

## 2012-10-07 DIAGNOSIS — L03019 Cellulitis of unspecified finger: Secondary | ICD-10-CM | POA: Insufficient documentation

## 2012-10-07 MED ORDER — CEPHALEXIN 500 MG PO CAPS: 500.0000 mg | ORAL_CAPSULE | Freq: Four times a day (QID) | ORAL | Status: DC

## 2012-10-07 NOTE — ED Notes (Signed)
Pt states he has had swelling on R middle finger for past several days that has gotten worse. Swelling noted above finger nail.

## 2012-10-07 NOTE — ED Provider Notes (Signed)
CSN: [id number]     Arrival date & time 10/07/12  0831 History   First MD Initiated Contact with Patient 10/07/12 [phone]     Chief Complaint  Patient presents with  . Finger Injury   (Consider location/radiation/quality/duration/timing/severity/associated sxs/prior Treatment) HPI Comments: Pt has had 4 days of pain & swelling of distal middle R finger around nail and development pus at edge of nail. He also caught it on something at work yesterday and has small abrasion of finger.   Patient is a 42 y.o. male presenting with hand pain. The history is provided by the patient. No language interpreter was used.  Hand Pain This is a new problem. The current episode started more than 2 days ago. The problem occurs constantly. The problem has been gradually worsening. Pertinent negatives include no chest pain, no abdominal pain, no headaches and no shortness of breath. Exacerbated by: moving finger. Nothing relieves the symptoms. He has tried nothing for the symptoms. The treatment provided no relief.    Past Medical History  Diagnosis Date  . Diabetes mellitus   . Hypertension    Past Surgical History  Procedure Laterality Date  . Abscess drainage      Left leg   History reviewed. No pertinent family history. History  Substance Use Topics  . Smoking status: Never Smoker   . Smokeless tobacco: Never Used  . Alcohol Use: No    Review of Systems  Constitutional: Negative for fever, activity change, appetite change and fatigue.  HENT: Negative for congestion, facial swelling, rhinorrhea and trouble swallowing.   Eyes: Negative for photophobia and pain.  Respiratory: Negative for cough, chest tightness and shortness of breath.   Cardiovascular: Negative for chest pain and leg swelling.  Gastrointestinal: Negative for nausea, vomiting, abdominal pain, diarrhea and constipation.  Endocrine: Negative for polydipsia and polyuria.  Genitourinary: Negative for dysuria, urgency, decreased urine  volume and difficulty urinating.  Musculoskeletal: Negative for back pain and gait problem.  Skin: Negative for color change, rash and wound.  Allergic/Immunologic: Negative for immunocompromised state.  Neurological: Negative for dizziness, facial asymmetry, speech difficulty, weakness, numbness and headaches.  Psychiatric/Behavioral: Negative for confusion, decreased concentration and agitation.    Allergies  Review of patient's allergies indicates no known allergies.  Home Medications   Current Outpatient Rx  Name  Route  Sig  Dispense  Refill  . aspirin 81 MG chewable tablet   Oral   Chew 81 mg by mouth daily.         . metFORMIN (GLUCOPHAGE) 500 MG tablet   Oral   Take 1,000 mg by mouth daily with breakfast.         . cephALEXin (KEFLEX) 500 MG capsule   Oral   Take 1 capsule (500 mg total) by mouth 4 (four) times daily.   28 capsule   0    BP 192/99  Pulse 80  Temp(Src) 98.3 F (36.8 C) (Oral)  Resp 22  SpO2 99% Physical Exam  Constitutional: He is oriented to person, place, and time. He appears well-developed and well-nourished. No distress.  HENT:  Head: Normocephalic and atraumatic.  Mouth/Throat: No oropharyngeal exudate.  Eyes: Pupils are equal, round, and reactive to light.  Neck: Normal range of motion. Neck supple.  Cardiovascular: Normal rate, regular rhythm and normal heart sounds.  Exam reveals no gallop and no friction rub.   No murmur heard. Pulmonary/Chest: Effort normal and breath sounds normal. No respiratory distress. He has no wheezes. He has no rales.  Abdominal: Soft. Bowel sounds are normal. He exhibits no distension and no mass. There is no tenderness. There is no rebound and no guarding.  Musculoskeletal: Normal range of motion. He exhibits no edema and no tenderness.       Hands: Neurological: He is alert and oriented to person, place, and time.  Skin: Skin is warm and dry.  Psychiatric: He has a normal mood and affect.    ED  Course  Procedures (including critical care time) Labs Review Labs Reviewed - No data to display Imaging Review No results found.  INCISION AND DRAINAGE Performed by: Ernestina Patches, E Consent: Verbal consent obtained. Risks and benefits: risks, benefits and alternatives were discussed Time out performed prior to procedure Type: abscess Body area: R middle finger paronychia Anesthesia: local infiltration Incision was made with a scalpel. Local anesthetic: lidocaine 1% Anesthetic total: 2 ml Complexity: Simple  Drainage: purulent Drainage amount: 2cc Packing material: none Patient tolerance: Patient tolerated the procedure well with no immediate complications.  & MDM   1. Paronychia, finger, right    Pt is a 42 y.o. male with Pmhx as above who presents with 4 days of distal R ring finger pain/swelling and PE c/w paronychia and early cellulitis.  He states he also caught it on something at work yesterday, has small abrasion of finger.  I doubt underlying fracture given pain/swelling present before yesterday.  I&D done with moderate amount of purulent fluid released.  Will place on PO keflex and recommend warm soaks with massage at home.  Return precautions given for new or worsening symptoms including worsening redness/swelling, fever  1. Paronychia, finger, right         Neta Ehlers, MD 10/07/12 [phone]

## 2012-10-07 NOTE — Progress Notes (Signed)
[facility] provided pt with a list of primary care resources. Patient stated that he was pending insurance through his job.

## 2015-10-14 ENCOUNTER — Encounter (HOSPITAL_COMMUNITY): Payer: Self-pay

## 2015-10-14 ENCOUNTER — Emergency Department (HOSPITAL_COMMUNITY)
Admission: EM | Admit: 2015-10-14 | Discharge: 2015-10-14 | Disposition: A | Discharge status: Home / Self Care | Payer: Self-pay | Attending: Emergency Medicine | Admitting: Emergency Medicine

## 2015-10-14 DIAGNOSIS — Z7984 Long term (current) use of oral hypoglycemic drugs: Secondary | ICD-10-CM | POA: Insufficient documentation

## 2015-10-14 DIAGNOSIS — I10 Essential (primary) hypertension: Secondary | ICD-10-CM | POA: Insufficient documentation

## 2015-10-14 DIAGNOSIS — Z7982 Long term (current) use of aspirin: Secondary | ICD-10-CM | POA: Insufficient documentation

## 2015-10-14 DIAGNOSIS — L0211 Cutaneous abscess of neck: Secondary | ICD-10-CM | POA: Insufficient documentation

## 2015-10-14 DIAGNOSIS — E119 Type 2 diabetes mellitus without complications: Secondary | ICD-10-CM | POA: Insufficient documentation

## 2015-10-14 DIAGNOSIS — L0291 Cutaneous abscess, unspecified: Secondary | ICD-10-CM

## 2015-10-14 LAB — CBG MONITORING, ED: Glucose-Capillary: 151 mg/dL — ABNORMAL HIGH (ref 65–99)

## 2015-10-14 MED ORDER — CEPHALEXIN 500 MG PO CAPS: 500.0000 mg | ORAL_CAPSULE | Freq: Four times a day (QID) | ORAL | 0 refills | Status: DC

## 2015-10-14 MED ORDER — HYDROCHLOROTHIAZIDE 25 MG PO TABS: 25.0000 mg | ORAL_TABLET | Freq: Every day | ORAL | 0 refills | Status: DC

## 2015-10-14 NOTE — ED Triage Notes (Signed)
Pt here with abscess to back of neck. Started Thursday.

## 2015-10-14 NOTE — ED Notes (Signed)
Bed: WA20 Expected date:  Expected time:  Means of arrival:  Comments: 

## 2015-10-14 NOTE — ED Notes (Signed)
Bed: [facility]1 Expected date:  Expected time:  Means of arrival:  Comments: 

## 2015-10-14 NOTE — ED Provider Notes (Signed)
[facility] DEPT Provider Note   CSN: [id number] Arrival date & time: 10/14/15  1023     History   Chief Complaint Chief Complaint  Patient presents with  . Abscess    HPI Adam Vincent is a 45 y.o. male.  Patient is 45 yo M with PMH of diabetes and hypertension, presenting with "abscess to back of neck" that developed on Thursday 10/5. He states it started as "hair bump," but increased in size and became more painful over the weekend. States he put hot compress and alcohol on abscess, with some relief of pain and swelling, and "drainage" noted on Saturday. He states the site of the "abscess" is now hard and mildly painful, and he wanted to get it checked out for any infection. Denies any other skin changes. Denies fever, chills, sore throat, pain with movement of neck, or nuchal rigidity. Also denies any chest pain, palpitations, shortness of breath, numbness, weakness, dizziness, headache, or visual changes. States he does not see PCP or take any daily medicines to control his hypertension or diabetes.       Past Medical History:  Diagnosis Date  . Diabetes mellitus   . Hypertension     There are no active problems to display for this patient.   Past Surgical History:  Procedure Laterality Date  . ABSCESS DRAINAGE     Left leg       Home Medications    Prior to Admission medications   Medication Sig Start Date End Date Taking? Authorizing Provider  aspirin 81 MG chewable tablet Chew 81 mg by mouth daily.    Historical Provider, MD  cephALEXin (KEFLEX) 500 MG capsule Take 1 capsule (500 mg total) by mouth 4 (four) times daily. 10/07/12   Ernestina Patches, MD  metFORMIN (GLUCOPHAGE) 500 MG tablet Take 1,000 mg by mouth daily with breakfast.    Historical Provider, MD    Family History History reviewed. No pertinent family history.  Social History Social History  Substance Use Topics  . Smoking status: Never Smoker  . Smokeless tobacco: Never Used  .  Alcohol use No     Allergies   Review of patient's allergies indicates no known allergies.   Review of Systems Review of Systems  Constitutional: Negative for chills and fever.  HENT: Negative for ear pain and sore throat.   Eyes: Negative for pain and visual disturbance.  Respiratory: Negative for cough and shortness of breath.   Cardiovascular: Negative for chest pain, palpitations and leg swelling.  Gastrointestinal: Negative for abdominal pain, blood in stool, nausea and vomiting.  Genitourinary: Negative for dysuria, flank pain and hematuria.  Musculoskeletal: Negative for back pain and neck pain.  Skin: Negative for color change and rash.  Neurological: Negative for dizziness, seizures, syncope, weakness, numbness and headaches.     Physical Exam Updated Vital Signs BP (!) 206/119 (BP Location: Left Arm)   Pulse 76   Temp 98.3 F (36.8 C) (Oral)   Resp 18   SpO2 100%   Physical Exam  Constitutional:  Obese male, appears comfortable lying in bed, no acute distress  HENT:  Head: Normocephalic and atraumatic.  Mouth/Throat: Oropharynx is clear and moist.  Eyes: Conjunctivae are normal.  Neck: Normal range of motion.  1 cm scabbed over, raised lesion noted to occipital region of scalp. No fluctuance, erythema, or drainage noted. 5 x 5 cm area of indurated tissue surrounding lesion. Thickened, velvety texture of skin noted to back of neck.  Cardiovascular: Normal rate,  regular rhythm, normal heart sounds and intact distal pulses.   Pulmonary/Chest: Effort normal and breath sounds normal. No respiratory distress.  Abdominal: Soft. There is no tenderness.  Musculoskeletal: Normal range of motion. He exhibits no edema.  Neurological: He is alert.  Skin: Skin is warm and dry. No rash noted.  Psychiatric: He has a normal mood and affect.  Nursing note and vitals reviewed.    ED Treatments / Results  Labs (all labs ordered are listed, but only abnormal results are  displayed) Labs Reviewed - No data to display  EKG  EKG Interpretation None       Radiology No results found.  Procedures Procedures (including critical care time)  Medications Ordered in ED Medications - No data to display   Initial Impression / Assessment and Plan / ED Course  I have reviewed the triage vital signs and the nursing notes.  Pertinent labs & imaging results that were available during my care of the patient were reviewed by me and considered in my medical decision making (see chart for details).  Clinical Course   Patient is 45 yo M with PMH of diabetes and hypertension, presenting with "abscess to back of neck." 1 cm lesion with surrounding indurated tissue noted to occipital region of scalp, but no fluctuance, erythema, or signs of infection. Appears to be self-resolving with no I&D necessary, but will prescribe Keflex for antibiotic coverage. CBG 151 and BP 206/119, but no s/s of end organ dysfunction, and otherwise stable for d/c home. Given prescription for HCTZ, and information to establish care with PCP at [facility]. Discussed case with Dr. Adrian Prows who agreed with plan. Advised to return to ED for new or worsening symptoms including fever, swelling, pain, signs of infection, or numbness, weakness, slurred speech, or visual changes.   Final Clinical Impressions(s) / ED Diagnoses   Final diagnoses:  Abscess  Hypertension, unspecified type    New Prescriptions New Prescriptions   HYDROCHLOROTHIAZIDE (HYDRODIURIL) 25 MG TABLET    Take 1 tablet (25 mg total) by mouth daily.     Stacie Glaze Lillian II, [place] 10/14/15 [place], MD 10/14/15 [phone]

## 2016-02-03 ENCOUNTER — Encounter (HOSPITAL_COMMUNITY): Payer: Self-pay | Admitting: Adult Health

## 2016-02-03 ENCOUNTER — Inpatient Hospital Stay (HOSPITAL_COMMUNITY)
Admission: EM | Admit: 2016-02-03 | Discharge: 2016-02-26 | DRG: 064 | Disposition: A | Discharge status: Skilled Nursing Facility | Payer: Medicaid Other | Attending: Neurology | Admitting: Neurology

## 2016-02-03 ENCOUNTER — Emergency Department (HOSPITAL_COMMUNITY): Payer: Medicaid Other

## 2016-02-03 DIAGNOSIS — G911 Obstructive hydrocephalus: Secondary | ICD-10-CM | POA: Diagnosis present

## 2016-02-03 DIAGNOSIS — R471 Dysarthria and anarthria: Secondary | ICD-10-CM | POA: Diagnosis present

## 2016-02-03 DIAGNOSIS — R509 Fever, unspecified: Secondary | ICD-10-CM | POA: Diagnosis not present

## 2016-02-03 DIAGNOSIS — Z79899 Other long term (current) drug therapy: Secondary | ICD-10-CM | POA: Diagnosis not present

## 2016-02-03 DIAGNOSIS — R51 Headache: Secondary | ICD-10-CM | POA: Diagnosis present

## 2016-02-03 DIAGNOSIS — I10 Essential (primary) hypertension: Secondary | ICD-10-CM | POA: Diagnosis present

## 2016-02-03 DIAGNOSIS — N183 Chronic kidney disease, stage 3 unspecified: Secondary | ICD-10-CM | POA: Diagnosis present

## 2016-02-03 DIAGNOSIS — I618 Other nontraumatic intracerebral hemorrhage: Secondary | ICD-10-CM | POA: Diagnosis present

## 2016-02-03 DIAGNOSIS — Z6841 Body Mass Index (BMI) 40.0 and over, adult: Secondary | ICD-10-CM

## 2016-02-03 DIAGNOSIS — E1165 Type 2 diabetes mellitus with hyperglycemia: Secondary | ICD-10-CM | POA: Diagnosis present

## 2016-02-03 DIAGNOSIS — R0689 Other abnormalities of breathing: Secondary | ICD-10-CM | POA: Diagnosis not present

## 2016-02-03 DIAGNOSIS — N179 Acute kidney failure, unspecified: Secondary | ICD-10-CM | POA: Diagnosis not present

## 2016-02-03 DIAGNOSIS — Z7984 Long term (current) use of oral hypoglycemic drugs: Secondary | ICD-10-CM | POA: Diagnosis not present

## 2016-02-03 DIAGNOSIS — E669 Obesity, unspecified: Secondary | ICD-10-CM

## 2016-02-03 DIAGNOSIS — I619 Nontraumatic intracerebral hemorrhage, unspecified: Secondary | ICD-10-CM

## 2016-02-03 DIAGNOSIS — F1210 Cannabis abuse, uncomplicated: Secondary | ICD-10-CM | POA: Diagnosis present

## 2016-02-03 DIAGNOSIS — R0902 Hypoxemia: Secondary | ICD-10-CM | POA: Diagnosis not present

## 2016-02-03 DIAGNOSIS — R5383 Other fatigue: Secondary | ICD-10-CM | POA: Diagnosis not present

## 2016-02-03 DIAGNOSIS — D62 Acute posthemorrhagic anemia: Secondary | ICD-10-CM | POA: Diagnosis not present

## 2016-02-03 DIAGNOSIS — R1310 Dysphagia, unspecified: Secondary | ICD-10-CM | POA: Diagnosis present

## 2016-02-03 DIAGNOSIS — G936 Cerebral edema: Secondary | ICD-10-CM | POA: Diagnosis present

## 2016-02-03 DIAGNOSIS — D72829 Elevated white blood cell count, unspecified: Secondary | ICD-10-CM | POA: Diagnosis not present

## 2016-02-03 DIAGNOSIS — E785 Hyperlipidemia, unspecified: Secondary | ICD-10-CM | POA: Diagnosis present

## 2016-02-03 DIAGNOSIS — G8194 Hemiplegia, unspecified affecting left nondominant side: Secondary | ICD-10-CM | POA: Diagnosis present

## 2016-02-03 DIAGNOSIS — I639 Cerebral infarction, unspecified: Secondary | ICD-10-CM | POA: Diagnosis not present

## 2016-02-03 DIAGNOSIS — Z7982 Long term (current) use of aspirin: Secondary | ICD-10-CM | POA: Diagnosis not present

## 2016-02-03 DIAGNOSIS — I129 Hypertensive chronic kidney disease with stage 1 through stage 4 chronic kidney disease, or unspecified chronic kidney disease: Secondary | ICD-10-CM | POA: Diagnosis present

## 2016-02-03 DIAGNOSIS — Z4659 Encounter for fitting and adjustment of other gastrointestinal appliance and device: Secondary | ICD-10-CM

## 2016-02-03 DIAGNOSIS — I161 Hypertensive emergency: Secondary | ICD-10-CM | POA: Diagnosis present

## 2016-02-03 DIAGNOSIS — R29810 Facial weakness: Secondary | ICD-10-CM | POA: Diagnosis present

## 2016-02-03 DIAGNOSIS — R4182 Altered mental status, unspecified: Secondary | ICD-10-CM | POA: Diagnosis present

## 2016-02-03 DIAGNOSIS — I509 Heart failure, unspecified: Secondary | ICD-10-CM

## 2016-02-03 DIAGNOSIS — R229 Localized swelling, mass and lump, unspecified: Secondary | ICD-10-CM | POA: Diagnosis present

## 2016-02-03 DIAGNOSIS — G4733 Obstructive sleep apnea (adult) (pediatric): Secondary | ICD-10-CM | POA: Diagnosis present

## 2016-02-03 DIAGNOSIS — F329 Major depressive disorder, single episode, unspecified: Secondary | ICD-10-CM | POA: Diagnosis present

## 2016-02-03 DIAGNOSIS — E1122 Type 2 diabetes mellitus with diabetic chronic kidney disease: Secondary | ICD-10-CM | POA: Diagnosis present

## 2016-02-03 DIAGNOSIS — E1169 Type 2 diabetes mellitus with other specified complication: Secondary | ICD-10-CM | POA: Diagnosis present

## 2016-02-03 DIAGNOSIS — G919 Hydrocephalus, unspecified: Secondary | ICD-10-CM | POA: Diagnosis not present

## 2016-02-03 DIAGNOSIS — F32A Depression, unspecified: Secondary | ICD-10-CM | POA: Diagnosis present

## 2016-02-03 LAB — COMPREHENSIVE METABOLIC PANEL
ALT: 23 U/L (ref 17–63)
ANION GAP: 9 (ref 5–15)
AST: 20 U/L (ref 15–41)
Albumin: 3.6 g/dL (ref 3.5–5.0)
Alkaline Phosphatase: 55 U/L (ref 38–126)
BUN: 22 mg/dL — AB (ref 6–20)
CHLORIDE: 102 mmol/L (ref 101–111)
CO2: 27 mmol/L (ref 22–32)
Calcium: 9.2 mg/dL (ref 8.9–10.3)
Creatinine, Ser: 1.87 mg/dL — ABNORMAL HIGH (ref 0.61–1.24)
GFR calc Af Amer: 49 mL/min — ABNORMAL LOW (ref >60)
GFR calc non Af Amer: 42 mL/min — ABNORMAL LOW (ref >60)
Glucose, Bld: 170 mg/dL — ABNORMAL HIGH (ref 65–99)
POTASSIUM: 3.8 mmol/L (ref 3.5–5.1)
Sodium: 138 mmol/L (ref 135–145)
TOTAL PROTEIN: 8.1 g/dL (ref 6.5–8.1)
Total Bilirubin: 0.7 mg/dL (ref 0.3–1.2)

## 2016-02-03 LAB — DIFFERENTIAL
BASOS ABS: 0 10*3/uL (ref 0.0–0.1)
BASOS PCT: 0 %
EOS ABS: 0.2 10*3/uL (ref 0.0–0.7)
EOS PCT: 2 %
Lymphocytes Relative: 18 %
Lymphs Abs: 1.7 10*3/uL (ref 0.7–4.0)
MONOS PCT: 7 %
Monocytes Absolute: 0.7 10*3/uL (ref 0.1–1.0)
NEUTROS PCT: 73 %
Neutro Abs: 7.1 10*3/uL (ref 1.7–7.7)

## 2016-02-03 LAB — APTT: aPTT: 30 seconds (ref 24–36)

## 2016-02-03 LAB — CBC
HEMATOCRIT: 42.8 % (ref 39.0–52.0)
Hemoglobin: 13.7 g/dL (ref 13.0–17.0)
MCH: 27.3 pg (ref 26.0–34.0)
MCHC: 32 g/dL (ref 30.0–36.0)
MCV: 85.4 fL (ref 78.0–100.0)
Platelets: 272 10*3/uL (ref 150–400)
RBC: 5.01 MIL/uL (ref 4.22–5.81)
RDW: 14.7 % (ref 11.5–15.5)
WBC: 9.7 10*3/uL (ref 4.0–10.5)

## 2016-02-03 LAB — CBG MONITORING, ED: GLUCOSE-CAPILLARY: 154 mg/dL — AB (ref 65–99)

## 2016-02-03 LAB — I-STAT CHEM 8, ED
BUN: 26 mg/dL — AB (ref 6–20)
CALCIUM ION: 1.14 mmol/L — AB (ref 1.15–1.40)
Chloride: 101 mmol/L (ref 101–111)
Creatinine, Ser: 1.9 mg/dL — ABNORMAL HIGH (ref 0.61–1.24)
Glucose, Bld: 164 mg/dL — ABNORMAL HIGH (ref 65–99)
HCT: 46 % (ref 39.0–52.0)
Hemoglobin: 15.6 g/dL (ref 13.0–17.0)
Potassium: 3.8 mmol/L (ref 3.5–5.1)
SODIUM: 140 mmol/L (ref 135–145)
TCO2: 30 mmol/L (ref 0–100)

## 2016-02-03 LAB — I-STAT TROPONIN, ED: Troponin i, poc: 0.02 ng/mL (ref 0.00–0.08)

## 2016-02-03 LAB — PROTIME-INR
INR: 1.04
Prothrombin Time: 13.6 seconds (ref 11.4–15.2)

## 2016-02-03 LAB — GLUCOSE, CAPILLARY
GLUCOSE-CAPILLARY: 226 mg/dL — AB (ref 65–99)
Glucose-Capillary: 186 mg/dL — ABNORMAL HIGH (ref 65–99)

## 2016-02-03 MED ORDER — METFORMIN HCL 500 MG PO TABS
1000.0000 mg | ORAL_TABLET | Freq: Every day | ORAL | Status: DC
  Administered 2016-02-05: 1000 mg via ORAL
  Filled 2016-02-03: qty 2

## 2016-02-03 MED ORDER — LABETALOL HCL 5 MG/ML IV SOLN
20.0000 mg | Freq: Once | INTRAVENOUS | Status: AC
  Administered 2016-02-03: 20 mg via INTRAVENOUS
  Filled 2016-02-03: qty 4

## 2016-02-03 MED ORDER — HYDROMORPHONE HCL 1 MG/ML IJ SOLN
0.5000 mg | INTRAMUSCULAR | Status: AC | PRN
Start: 2016-02-03 — End: 2016-02-04
  Administered 2016-02-03 – 2016-02-04 (×2): 0.5 mg via INTRAVENOUS
  Filled 2016-02-03 (×2): qty 1

## 2016-02-03 MED ORDER — SENNOSIDES-DOCUSATE SODIUM 8.6-50 MG PO TABS
1.0000 | ORAL_TABLET | Freq: Two times a day (BID) | ORAL | Status: DC
Start: 2016-02-03 — End: 2016-02-06
  Administered 2016-02-04 – 2016-02-05 (×4): 1 via ORAL
  Filled 2016-02-03 (×5): qty 1

## 2016-02-03 MED ORDER — NICARDIPINE HCL IN NACL 20-0.86 MG/200ML-% IV SOLN
3.0000 mg/h | Freq: Once | INTRAVENOUS | Status: AC
  Administered 2016-02-03: 5 mg/h via INTRAVENOUS
  Filled 2016-02-03: qty 200

## 2016-02-03 MED ORDER — PANTOPRAZOLE SODIUM 40 MG IV SOLR
40.0000 mg | Freq: Every day | INTRAVENOUS | Status: DC
  Administered 2016-02-03 – 2016-02-05 (×3): 40 mg via INTRAVENOUS
  Filled 2016-02-03 (×3): qty 40

## 2016-02-03 MED ORDER — PNEUMOCOCCAL VAC POLYVALENT 25 MCG/0.5ML IJ INJ
0.5000 mL | INJECTION | INTRAMUSCULAR | Status: AC
  Administered 2016-02-04: 0.5 mL via INTRAMUSCULAR
  Filled 2016-02-03: qty 0.5

## 2016-02-03 MED ORDER — ACETAMINOPHEN 650 MG RE SUPP
650.0000 mg | RECTAL | Status: DC | PRN
  Administered 2016-02-04: 650 mg via RECTAL
  Filled 2016-02-03: qty 1

## 2016-02-03 MED ORDER — STROKE: EARLY STAGES OF RECOVERY BOOK
Freq: Once | Status: AC
  Administered 2016-02-04: 03:00:00
  Filled 2016-02-03: qty 1

## 2016-02-03 MED ORDER — ACETAMINOPHEN 160 MG/5ML PO SOLN
650.0000 mg | ORAL | Status: DC | PRN
  Administered 2016-02-08 – 2016-02-14 (×6): 650 mg
  Filled 2016-02-03 (×6): qty 20.3

## 2016-02-03 MED ORDER — ACETAMINOPHEN 325 MG PO TABS
650.0000 mg | ORAL_TABLET | ORAL | Status: DC | PRN
  Administered 2016-02-04 – 2016-02-12 (×5): 650 mg via ORAL
  Filled 2016-02-03 (×6): qty 2

## 2016-02-03 MED ORDER — INFLUENZA VAC SPLIT QUAD 0.5 ML IM SUSY
0.5000 mL | PREFILLED_SYRINGE | INTRAMUSCULAR | Status: DC
  Filled 2016-02-03: qty 0.5

## 2016-02-03 MED ORDER — SODIUM CHLORIDE 0.9 % IV SOLN
INTRAVENOUS | Status: DC
  Administered 2016-02-03 – 2016-02-24 (×18): via INTRAVENOUS

## 2016-02-03 MED ORDER — NICARDIPINE HCL IN NACL 20-0.86 MG/200ML-% IV SOLN
0.0000 mg/h | INTRAVENOUS | Status: DC
  Administered 2016-02-03 (×3): 15 mg/h via INTRAVENOUS
  Administered 2016-02-03: 12.5 mg/h via INTRAVENOUS
  Administered 2016-02-03 – 2016-02-04 (×3): 15 mg/h via INTRAVENOUS
  Filled 2016-02-03 (×3): qty 200
  Filled 2016-02-03 (×2): qty 400
  Filled 2016-02-03: qty 200

## 2016-02-03 NOTE — ED Notes (Addendum)
7 attempts at second IV and charge in with [place] and unsuccessful; phelb called x3 to come get blood. EDP made aware.

## 2016-02-03 NOTE — ED Provider Notes (Addendum)
[facility] DEPT Provider Note   CSN: [id number] Arrival date & time: 02/03/16  1331     History   Chief Complaint Chief Complaint  Patient presents with  . Stroke Symptoms    HPI Adam Vincent is a 46 y.o. male.  Patient c/o left sided weakness, trouble walking, slurred speech, and right sided headache since 2 AM today. Was last his normal self before going to be last PM.  Symptoms have been constant, persistent, all day, without specific exacerbating or alleviating factors. No hx same symptoms in past. Headache was acute in onset, persistent, moderate. States did slip and fall when went to bathroom at 2 AM, felt unsteady on feet then.  No loc or head injury w fall. No anticoag use. Hx uncontrolled hypertension.    The history is provided by the patient.    Past Medical History:  Diagnosis Date  . Diabetes mellitus   . Hypertension     There are no active problems to display for this patient.   Past Surgical History:  Procedure Laterality Date  . ABSCESS DRAINAGE     Left leg       Home Medications    Prior to Admission medications   Medication Sig Start Date End Date Taking? Authorizing Provider  aspirin 81 MG chewable tablet Chew 81 mg by mouth daily.    Historical Provider, MD  cephALEXin (KEFLEX) 500 MG capsule Take 1 capsule (500 mg total) by mouth 4 (four) times daily. 10/14/15   Daryl F de Villier II, PA  hydrochlorothiazide (HYDRODIURIL) 25 MG tablet Take 1 tablet (25 mg total) by mouth daily. 10/14/15 11/13/15  Daryl F de Villier II, PA  metFORMIN (GLUCOPHAGE) 500 MG tablet Take 1,000 mg by mouth daily with breakfast.    Historical Provider, MD    Family History History reviewed. No pertinent family history.  Social History Social History  Substance Use Topics  . Smoking status: Never Smoker  . Smokeless tobacco: Never Used  . Alcohol use No     Allergies   Patient has no known allergies.   Review of Systems Review of Systems    Constitutional: Negative for fever.  HENT: Negative for sore throat.   Eyes: Negative for pain and visual disturbance.  Respiratory: Negative for shortness of breath.   Cardiovascular: Negative for chest pain.  Gastrointestinal: Negative for abdominal pain.  Genitourinary: Negative for flank pain.  Musculoskeletal: Negative for back pain and neck pain.  Skin: Negative for rash.  Neurological: Positive for speech difficulty, weakness and headaches.  Hematological: Does not bruise/bleed easily.  Psychiatric/Behavioral: Negative for confusion.     Physical Exam Updated Vital Signs BP (!) 190/112   Pulse 74   Temp 99 F (37.2 C) (Oral)   Resp 18   Ht 6\' 1"  (1.854 m)   Wt (!) 176.9 kg   SpO2 100%   BMI 51.45 kg/m   Physical Exam  Constitutional: He appears well-developed and well-nourished. No distress.  HENT:  Head: Atraumatic.  Mouth/Throat: Oropharynx is clear and moist.  Eyes: Conjunctivae and EOM are normal. Pupils are equal, round, and reactive to light.  Neck: Neck supple. No tracheal deviation present.  No carotid bruits.   Cardiovascular: Normal rate, regular rhythm, normal heart sounds and intact distal pulses.  Exam reveals no gallop and no friction rub.   No murmur heard. Pulmonary/Chest: Effort normal and breath sounds normal. No accessory muscle usage. No respiratory distress.  Abdominal: Soft. Bowel sounds are normal. He exhibits  no distension. There is no tenderness.  No bruits.   Musculoskeletal: He exhibits no edema.  CTLS spine, non tender, aligned, no step off.   Neurological: He is alert.  Speech slurred/dysarthric. Left sided weakness, 4+/5.   Skin: Skin is warm and dry. He is not diaphoretic.  Psychiatric: He has a normal mood and affect.  Nursing note and vitals reviewed.    ED Treatments / Results  Labs (all labs ordered are listed, but only abnormal results are displayed) Labs Reviewed  CBG MONITORING, ED - Abnormal; Notable for the  following:       Result Value   Glucose-Capillary 154 (*)    All other components within normal limits  PROTIME-INR  APTT  CBC  DIFFERENTIAL  COMPREHENSIVE METABOLIC PANEL  CBG MONITORING, ED  I-STAT TROPOININ, ED  I-STAT CHEM 8, ED    EKG  EKG Interpretation None       Radiology No results found.  Procedures Procedures (including critical care time)  Medications Ordered in ED Medications - No data to display   Initial Impression / Assessment and Plan / ED Course  I have reviewed the triage vital signs and the nursing notes.  Pertinent labs & imaging results that were available during my care of the patient were reviewed by me and considered in my medical decision making (see chart for details).  Stat head CT.  Labs.  Continuous pulse ox and monitor.   BP severely elevated.   Nicardipine drip.   Neurosurgery consulted.  Reviewed nursing notes and prior charts for additional history.   Recheck pt post ct. No change in neuro exam from prior to ct.   Nicardipine gtt titrated by rn staff.  bp remains high.  Will also give dose labetalol iv.   Dilaudid .5 mg iv for pain. zofran for nausea.   CRITICAL CARE  RE acute thalamic hemorrhage, uncontrolled hypertension, nicardipine gtt, neuro icu admission, emergent neurology and neurosurgery consult Performed by: Mirna Mires Total critical care time: 35 minutes Critical care time was exclusive of separately billable procedures and treating other patients. Critical care was necessary to treat or prevent imminent or life-threatening deterioration. Critical care was time spent personally by me on the following activities: development of treatment plan with patient and/or surrogate as well as nursing, discussions with consultants, evaluation of patient's response to treatment, examination of patient, obtaining history from patient or surrogate, ordering and performing treatments and interventions, ordering and review  of laboratory studies, ordering and review of radiographic studies, pulse oximetry and re-evaluation of patient's condition.  Discussed with Dr Leonel Ramsay - he will admit.    Final Clinical Impressions(s) / ED Diagnoses   Final diagnoses:  None    New Prescriptions New Prescriptions   No medications on file         Lajean Saver, MD 02/03/16 1504

## 2016-02-03 NOTE — ED Notes (Addendum)
PT CBG was 154. Nurse notified

## 2016-02-03 NOTE — ED Notes (Signed)
Report given to John

## 2016-02-03 NOTE — H&P (Signed)
Requesting Physician: Dr. Ashok Cordia    Chief Complaint: ICH  History obtained from:  Patient     HPI:                                                                                                                                         Adam Vincent is an 46 y.o. male with known hyperlipidemia and hypertension who does not see PCP. Patient states at that he was going to the bathroom multiple times last night when about 12:00 he noted a sudden onset of headache. That morning he woke up and he noted that he is weaker in his left arm and his left leg. He also continued to have a headache. He had no other complaints. Patient was brought to the emergency department where CT of the brain showed intraparenchymal hemorrhage seen in the right  thalamus with surrounding white matter edema. Currently blood pressure is being maintained with Cardene. Neurology was asked to consult.  Date last known well: Date: 02/02/2016 Time last known well: Time: 24:00 tPA Given: No: ICH   Past Medical History:  Diagnosis Date  . Diabetes mellitus   . Hypertension     Past Surgical History:  Procedure Laterality Date  . ABSCESS DRAINAGE     Left leg    History reviewed. No pertinent family history. Social History:  reports that he has never smoked. He has never used smokeless tobacco. He reports that he uses drugs, including Marijuana. He reports that he does not drink alcohol.  Allergies: No Known Allergies  Medications:                                                                                                                           Current Facility-Administered Medications  Medication Dose Route Frequency Provider Last Rate Last Dose  . [START ON 02/04/2016] metFORMIN (GLUCOPHAGE) tablet 1,000 mg  1,000 mg Oral Q breakfast Marliss Coots, PA-C       Current Outpatient Prescriptions  Medication Sig Dispense Refill  . aspirin 81 MG chewable tablet Chew 81 mg by mouth daily.    Marland Kitchen ibuprofen  (ADVIL,MOTRIN) 100 MG tablet Take 200 mg by mouth every 6 (six) hours as needed for pain.    . hydrochlorothiazide (HYDRODIURIL) 25 MG tablet Take 1 tablet (25 mg total) by mouth daily. 30 tablet 0  . metFORMIN (  GLUCOPHAGE) 500 MG tablet Take 1,000 mg by mouth daily with breakfast.       ROS:                                                                                                                                       History obtained from chart review  General ROS: negative for - chills, fatigue, fever, night sweats, weight gain or weight loss Psychological ROS: negative for - behavioral disorder, hallucinations, memory difficulties, mood swings or suicidal ideation Ophthalmic ROS: negative for - blurry vision, double vision, eye pain or loss of vision ENT ROS: negative for - epistaxis, nasal discharge, oral lesions, sore throat, tinnitus or vertigo Allergy and Immunology ROS: negative for - hives or itchy/watery eyes Hematological and Lymphatic ROS: negative for - bleeding problems, bruising or swollen lymph nodes Endocrine ROS: negative for - galactorrhea, hair pattern changes, polydipsia/polyuria or temperature intolerance Respiratory ROS: negative for - cough, hemoptysis, shortness of breath or wheezing Cardiovascular ROS: negative for - chest pain, dyspnea on exertion, edema or irregular heartbeat Gastrointestinal ROS: negative for - abdominal pain, diarrhea, hematemesis, nausea/vomiting or stool incontinence Genito-Urinary ROS: negative for - dysuria, hematuria, incontinence or urinary frequency/urgency Musculoskeletal ROS: negative for - joint swelling or muscular weakness Neurological ROS: as noted in HPI Dermatological ROS: negative for rash and skin lesion changes  Neurologic Examination:                                                                                                      Blood pressure (!) 201/112, pulse 79, temperature 99 F (37.2 C), temperature source  Oral, resp. rate 23, height 6\' 1"  (1.854 m), weight (!) 176.9 kg (390 lb), SpO2 100 %.  HEENT-  Normocephalic, no lesions, without obvious abnormality.  Normal external eye and conjunctiva.  Normal TM's bilaterally.  Normal auditory canals and external ears. Normal external nose, mucus membranes and septum.  Normal pharynx. Cardiovascular- S1, S2 normal, pulses palpable throughout   Lungs- chest clear, no wheezing, rales, normal symmetric air entry Abdomen- normal findings: bowel sounds normal Extremities- no edema Lymph-no adenopathy palpable Musculoskeletal-no joint tenderness, deformity or swelling Skin-warm and dry, no hyperpigmentation, vitiligo, or suspicious lesions  Neurological Examination Mental Status: oriented, thought content appropriate.  Speech fluent without evidence of aphasia.  Able to follow 3 step commands without difficulty. Cranial Nerves: II: Discs flat bilaterally; Visual fields grossly normal,  III,IV, VI: ptosis not present, extra-ocular motions intact bilaterally, pupils equal, round, reactive to  light and accommodation V,VII: smile symmetric, facial light touch sensation normal bilaterally VIII: hearing normal bilaterally IX,X: uvula rises symmetrically XI: bilateral shoulder shrug XII: midline tongue extension Motor: Right : Upper extremity   5/5    Left:     Upper extremity   4/5  Lower extremity   5/5     Lower extremity   4/5 Tone and bulk:normal tone throughout; no atrophy noted Sensory: Pinprick and light touch intact throughout, bilaterally Deep Tendon Reflexes: 2+ and symmetric throughout Plantars: Right: downgoing   Left: downgoing Cerebellar: normal finger-to-nose,  and normal heel-to-shin test Gait: Not tested       Lab Results: Basic Metabolic Panel: No results for input(s): NA, K, CL, CO2, GLUCOSE, BUN, CREATININE, CALCIUM, MG, PHOS in the last 168 hours.  Liver Function Tests: No results for input(s): AST, ALT, ALKPHOS, BILITOT,  PROT, ALBUMIN in the last 168 hours. No results for input(s): LIPASE, AMYLASE in the last 168 hours. No results for input(s): AMMONIA in the last 168 hours.  CBC: No results for input(s): WBC, NEUTROABS, HGB, HCT, MCV, PLT in the last 168 hours.  Cardiac Enzymes: No results for input(s): CKTOTAL, CKMB, CKMBINDEX, TROPONINI in the last 168 hours.  Lipid Panel: No results for input(s): CHOL, TRIG, HDL, CHOLHDL, VLDL, LDLCALC in the last 168 hours.  CBG:  Recent Labs Lab 02/03/16 1341  GLUCAP 154*    Microbiology: Results for orders placed or performed during the hospital encounter of 02/09/11  Urine culture     Status: None   Collection Time: 02/09/11 11:03 AM  Result Value Ref Range Status   Specimen Description URINE, CLEAN CATCH  Final   Special Requests NONE  Final   Culture  Setup Time [phone]  Final   Colony Count NO GROWTH  Final   Culture NO GROWTH  Final   Report Status 02/10/2011 FINAL  Final    Coagulation Studies: No results for input(s): LABPROT, INR in the last 72 hours.  Imaging: Ct Head Wo Contrast  Result Date: 02/03/2016 CLINICAL DATA:  Left-sided weakness and headache. EXAM: CT HEAD WITHOUT CONTRAST TECHNIQUE: Contiguous axial images were obtained from the base of the skull through the vertex without intravenous contrast. COMPARISON:  CT scan of August 04, 2011. FINDINGS: Brain: Mild chronic ischemic white matter disease is noted 29 x 13 mm hemorrhage is noted in right thalamus with surrounding white matter edema. Mild dilatation of lateral ventricles is noted. No acute infarction is noted. No midline shift is noted. Vascular: No hyperdense vessel or unexpected calcification. Skull: Normal. Negative for fracture or focal lesion. Sinuses/Orbits: Mild right maxillary mucosal thickening is noted. Other: None. IMPRESSION: Interval development of intraparenchymal hemorrhage seen in the right thalamus with surrounding white matter edema. Mild dilatation of  lateral ventricles is noted. Critical Value/emergent results were called by telephone at the time of interpretation on 02/03/2016 at 2:17 pm to Dr. Lajean Saver , who verbally acknowledged these results. Electronically Signed   By: Marijo Conception, [name]   On: 02/03/2016 14:18       Assessment and plan discussed with with attending physician and they are in agreement.    Etta Quill PA-C [facility] [phone]  02/03/2016, 3:51 PM   He is lethargic, but easily rousable. He is oriented and apropriate with questions. He has a mild left hemiparesis.   Assessment: 46 y.o. male presenting to the emergency department with elevated blood pressure systolic blood pressure in the 696E and diastolic 952. CT brain  showed a right thalamic intracerebral hemorrhage with mild  Enlargement of the ventricles. Neurosurgery was called and reviewed his scan and if his exam declines are available for consultation.   Stroke Risk Factors - hyperlipidemia and hypertension  1) Admit to ICU 2) no antiplatelets or anticoagulants 3) blood pressure control with goal systolic < 616 4) Frequent neuro checks 5) If symptoms worsen or there is decreased mental status, repeat stat head CT 6) PT,OT,ST 7) Repeat head CT in the morning.    This patient is critically ill and at significant risk of neurological worsening, death and care requires constant monitoring of vital signs, hemodynamics,respiratory and cardiac monitoring, neurological assessment, discussion with family, other specialists and medical decision making of high complexity. I spent 50 minutes of neurocritical care time  in the care of  this patient.  Roland Rack, MD [facility] [phone]  If 7pm- 7am, please page neurology on call as listed in [place]. 02/03/2016  4:10 PM

## 2016-02-03 NOTE — Progress Notes (Signed)
[facility] Progress Note Patient Name: Adam Vincent DOB: May 31, 1970 MRN: [id number]   Date of Service  02/03/2016  HPI/Events of Note  Admitted with hypertensive emergency, stroke BP at goal on Cardene drip. Stable on camera check  eICU Interventions  No eICU interventions     Intervention Category Evaluation Type: New Patient Evaluation  [name] 02/03/2016, 6:12 PM

## 2016-02-03 NOTE — ED Triage Notes (Signed)
Presents with left sided weakness and headache, left arm with drift, no facial droop. Slight slurred speech, no aphasia, denies numbness and tingling. C/o left arm heaviness. C/o right sided headache. Follows commands. pt is hypertensive 220/130. CBG 118.

## 2016-02-03 NOTE — ED Notes (Signed)
EDP notified pt becoming more obtunded; Answers to verbal stimuli but appears lethargic.

## 2016-02-03 NOTE — Progress Notes (Signed)
Upon assessment, found pt BP to be elevated above parameters (SBP <140). Repositioned cuff to L leg from arm and then up to L wrist - BP remains elevated above parameter. Informed on-call neurology MD and was given orders for 0.5mg  dilaudid IVP (2 doses) PRN in attempt to control pain. MD concerned pain might be increasing BP.   Will continue to monitor and notify MD again if necessary.  Guadalupe Maple, RN

## 2016-02-03 NOTE — ED Notes (Signed)
Patient transported to CT 

## 2016-02-04 ENCOUNTER — Encounter (HOSPITAL_COMMUNITY): Payer: Self-pay | Admitting: *Deleted

## 2016-02-04 ENCOUNTER — Other Ambulatory Visit (HOSPITAL_COMMUNITY): Payer: Self-pay

## 2016-02-04 ENCOUNTER — Inpatient Hospital Stay (HOSPITAL_COMMUNITY): Payer: Medicaid Other

## 2016-02-04 DIAGNOSIS — R739 Hyperglycemia, unspecified: Secondary | ICD-10-CM

## 2016-02-04 DIAGNOSIS — E1159 Type 2 diabetes mellitus with other circulatory complications: Secondary | ICD-10-CM

## 2016-02-04 DIAGNOSIS — I610 Nontraumatic intracerebral hemorrhage in hemisphere, subcortical: Secondary | ICD-10-CM

## 2016-02-04 DIAGNOSIS — E785 Hyperlipidemia, unspecified: Secondary | ICD-10-CM

## 2016-02-04 LAB — HIV ANTIBODY (ROUTINE TESTING W REFLEX): HIV Screen 4th Generation wRfx: NONREACTIVE

## 2016-02-04 LAB — CBC
HCT: 42.6 % (ref 39.0–52.0)
HEMOGLOBIN: 13.5 g/dL (ref 13.0–17.0)
MCH: 27.2 pg (ref 26.0–34.0)
MCHC: 31.7 g/dL (ref 30.0–36.0)
MCV: 85.9 fL (ref 78.0–100.0)
Platelets: 255 10*3/uL (ref 150–400)
RBC: 4.96 MIL/uL (ref 4.22–5.81)
RDW: 15.3 % (ref 11.5–15.5)
WBC: 12.1 10*3/uL — ABNORMAL HIGH (ref 4.0–10.5)

## 2016-02-04 LAB — LIPID PANEL
CHOL/HDL RATIO: 3.8 ratio
Cholesterol: 153 mg/dL (ref 0–200)
HDL: 40 mg/dL — AB (ref >40)
LDL CALC: 78 mg/dL (ref 0–99)
Triglycerides: 174 mg/dL — ABNORMAL HIGH (ref <150)
VLDL: 35 mg/dL (ref 0–40)

## 2016-02-04 LAB — GLUCOSE, CAPILLARY
GLUCOSE-CAPILLARY: 180 mg/dL — AB (ref 65–99)
GLUCOSE-CAPILLARY: 234 mg/dL — AB (ref 65–99)
Glucose-Capillary: 163 mg/dL — ABNORMAL HIGH (ref 65–99)
Glucose-Capillary: 221 mg/dL — ABNORMAL HIGH (ref 65–99)
Glucose-Capillary: 199 mg/dL — ABNORMAL HIGH (ref 65–99)

## 2016-02-04 LAB — BASIC METABOLIC PANEL
Anion gap: 11 (ref 5–15)
BUN: 25 mg/dL — AB (ref 6–20)
CHLORIDE: 104 mmol/L (ref 101–111)
CO2: 24 mmol/L (ref 22–32)
Calcium: 8.9 mg/dL (ref 8.9–10.3)
Creatinine, Ser: 2.14 mg/dL — ABNORMAL HIGH (ref 0.61–1.24)
GFR calc Af Amer: 42 mL/min — ABNORMAL LOW (ref >60)
GFR calc non Af Amer: 36 mL/min — ABNORMAL LOW (ref >60)
GLUCOSE: 173 mg/dL — AB (ref 65–99)
POTASSIUM: 3.8 mmol/L (ref 3.5–5.1)
Sodium: 139 mmol/L (ref 135–145)

## 2016-02-04 LAB — TSH: TSH: 0.886 u[IU]/mL (ref 0.350–4.500)

## 2016-02-04 LAB — VITAMIN B12: VITAMIN B 12: 449 pg/mL (ref 180–914)

## 2016-02-04 LAB — RPR: RPR Ser Ql: NONREACTIVE

## 2016-02-04 LAB — MRSA PCR SCREENING: MRSA by PCR: NEGATIVE

## 2016-02-04 MED ORDER — CLEVIDIPINE BUTYRATE 0.5 MG/ML IV EMUL
0.0000 mg/h | INTRAVENOUS | Status: DC
  Administered 2016-02-04 (×2): 20 mg/h via INTRAVENOUS
  Administered 2016-02-04: 10 mg/h via INTRAVENOUS
  Administered 2016-02-04 (×2): 20 mg/h via INTRAVENOUS
  Administered 2016-02-04 (×2): 18 mg/h via INTRAVENOUS
  Administered 2016-02-04: 1 mg/h via INTRAVENOUS
  Administered 2016-02-04: 18 mg/h via INTRAVENOUS
  Administered 2016-02-04: 20 mg/h via INTRAVENOUS
  Administered 2016-02-05: 21 mg/h via INTRAVENOUS
  Administered 2016-02-05 (×2): 16 mg/h via INTRAVENOUS
  Administered 2016-02-05: 19 mg/h via INTRAVENOUS
  Administered 2016-02-05: 21 mg/h via INTRAVENOUS
  Administered 2016-02-05 – 2016-02-06 (×4): 18 mg/h via INTRAVENOUS
  Administered 2016-02-06: 19 mg/h via INTRAVENOUS
  Administered 2016-02-06: 18 mg/h via INTRAVENOUS
  Administered 2016-02-06: 19 mg/h via INTRAVENOUS
  Administered 2016-02-06: 17 mg/h via INTRAVENOUS
  Administered 2016-02-06: 19 mg/h via INTRAVENOUS
  Administered 2016-02-07: 4 mg/h via INTRAVENOUS
  Administered 2016-02-07: 7 mg/h via INTRAVENOUS
  Filled 2016-02-04 (×3): qty 50
  Filled 2016-02-04: qty 100
  Filled 2016-02-04 (×7): qty 50
  Filled 2016-02-04: qty 100
  Filled 2016-02-04: qty 50
  Filled 2016-02-04 (×2): qty 100
  Filled 2016-02-04 (×13): qty 50

## 2016-02-04 MED ORDER — LABETALOL HCL 5 MG/ML IV SOLN
10.0000 mg | INTRAVENOUS | Status: DC | PRN
  Administered 2016-02-05 – 2016-02-13 (×7): 20 mg via INTRAVENOUS
  Administered 2016-02-14 (×2): 10 mg via INTRAVENOUS
  Administered 2016-02-16: 20 mg via INTRAVENOUS
  Filled 2016-02-04 (×10): qty 4

## 2016-02-04 MED ORDER — INSULIN ASPART 100 UNIT/ML ~~LOC~~ SOLN
0.0000 [IU] | SUBCUTANEOUS | Status: DC
Start: 2016-02-04 — End: 2016-02-05
  Administered 2016-02-04: 3 [IU] via SUBCUTANEOUS
  Administered 2016-02-04: 5 [IU] via SUBCUTANEOUS
  Administered 2016-02-04: 3 [IU] via SUBCUTANEOUS
  Administered 2016-02-04: 5 [IU] via SUBCUTANEOUS
  Administered 2016-02-04 – 2016-02-05 (×4): 3 [IU] via SUBCUTANEOUS

## 2016-02-04 MED ORDER — DEXTROSE 5 % IV SOLN
0.5000 mg/min | INTRAVENOUS | Status: DC
  Administered 2016-02-04: 0.5 mg/min via INTRAVENOUS
  Filled 2016-02-04: qty 100

## 2016-02-04 MED ORDER — INSULIN GLARGINE 100 UNIT/ML ~~LOC~~ SOLN
10.0000 [IU] | Freq: Every day | SUBCUTANEOUS | Status: DC
  Administered 2016-02-04: 10 [IU] via SUBCUTANEOUS
  Filled 2016-02-04 (×2): qty 0.1

## 2016-02-04 MED ORDER — ATORVASTATIN CALCIUM 10 MG PO TABS
10.0000 mg | ORAL_TABLET | Freq: Every day | ORAL | Status: DC
  Administered 2016-02-05: 10 mg via ORAL
  Filled 2016-02-04: qty 1

## 2016-02-04 MED ORDER — INSULIN ASPART 100 UNIT/ML ~~LOC~~ SOLN: 0.0000 [IU] | Freq: Three times a day (TID) | SUBCUTANEOUS | Status: DC

## 2016-02-04 MED ORDER — HYDRALAZINE HCL 25 MG PO TABS
25.0000 mg | ORAL_TABLET | Freq: Three times a day (TID) | ORAL | Status: DC
  Administered 2016-02-04 – 2016-02-05 (×2): 25 mg via ORAL
  Filled 2016-02-04 (×2): qty 1

## 2016-02-04 MED ORDER — AMLODIPINE BESYLATE 10 MG PO TABS
10.0000 mg | ORAL_TABLET | Freq: Every day | ORAL | Status: DC
  Administered 2016-02-04 – 2016-02-26 (×22): 10 mg via ORAL
  Filled 2016-02-04 (×22): qty 1

## 2016-02-04 MED ORDER — LABETALOL HCL 100 MG PO TABS
300.0000 mg | ORAL_TABLET | Freq: Three times a day (TID) | ORAL | Status: DC
  Administered 2016-02-04 – 2016-02-05 (×6): 300 mg via ORAL
  Filled 2016-02-04 (×2): qty 3
  Filled 2016-02-04: qty 1
  Filled 2016-02-04 (×4): qty 3

## 2016-02-04 NOTE — Care Management Note (Signed)
Case Management Note  Patient Details  Name: Adam Vincent MRN: [id number] Date of Birth: 1970-07-30  Subjective/Objective: Pt admitted on 02/03/16 s/p Rt intraparenchymal hemorrhage.  PTA, pt independent, lives with wife and 2 children.                   Action/Plan: Met with pt and wife to discuss dc needs.  Pt not talkative due to headache.  Wife states pt has no insurance and no PCP.  He is supposed to be taking BP and diabetic med, but does not take them.  She states he is "very hard-headed" and does not take care of himself.  Pt had an appointment at [facility] and [facility] in December, but did not go to appointment.  She has heard of the [facility] to assist with clinic appts.  Assured wife that we will have pt follow up at [facility] upon dc; they will be able to assess for eligibility for [facility] and Medication Assistance at this clinic.  She contracts that she will make sure he goes to appointment.  Will arrange follow up appointment closer to dc, when disposition is known.  PT/OT consults pending at this time.    Expected Discharge Date:                  Expected Discharge Plan:  [facility] [place]  In-House Referral:     Discharge planning Services  CM Consult;   Post Acute Care Choice:    Choice offered to:     DME Arranged:    DME Agency:     HH Arranged:    HH Agency:     Status of Service:  In process, will continue to follow  If discussed at Long Length of Stay Meetings, dates discussed:    Additional Comments:  Reinaldo Raddle, RN, BSN  Trauma/Neuro ICU Case Manager [phone]

## 2016-02-04 NOTE — Progress Notes (Signed)
During 2300 neuro check, found pt to have decreased LOC and increased work to arouse from sleep. Continued to check every 15 minutes and by 0000 - informed on-call neurology MD about pt condition. MD ordered stat CT that was read following - no new orders placed after CT.   During same time, pt's CBG - 226 at 0000. On-call neurology MD ordered moderate - sliding scale insulin for CBG correction as pt is NPO. Q4 CBG's will continue to be assessed.  Pt BP, continue to maintain greater than SBP <140 parameter. Informed neurology MD and received orders for titratable cleviprex gtt and to hold/discontinue cardene gtt - attempting to decrease pt BP below parameters.  Will continue to monitor closely.  Guadalupe Maple, RN

## 2016-02-04 NOTE — Evaluation (Signed)
Clinical/Bedside Swallow Evaluation Patient Details  Name: Adam Vincent MRN: [id number] Date of Birth: 07-Sep-1970  Today's Date: 02/04/2016 Time: SLP Start Time (ACUTE ONLY): 2409 SLP Stop Time (ACUTE ONLY): 0918 SLP Time Calculation (min) (ACUTE ONLY): 13 min  Past Medical History:  Past Medical History:  Diagnosis Date  . Diabetes mellitus   . Hypertension    Past Surgical History:  Past Surgical History:  Procedure Laterality Date  . ABSCESS DRAINAGE     Left leg   HPI:  46 y.o.male wtih PMH of HTN and DM who presents with elevated BP with systolic blood pressure in the 735H and diastolic 299. CT brain showed a right thalamic intracerebral hemorrhage with mild    Assessment / Plan / Recommendation Clinical Impression  Pt does not have overt s/s of aspiration with any textures administered, but he does need Mod cues to maintain alertness throughout trials. He has mildly prolonged mastication with solids, but this is also likely related to large bolus and missing dentition. Recommend Dys 3 diet and thin liquids for today for energy conservation. Will f/u for tolerance and likely advancement as alertness improves.    Aspiration Risk  Mild aspiration risk    Diet Recommendation Dysphagia 3 (Mech soft);Thin liquid   Liquid Administration via: Cup;Straw Medication Administration: Whole meds with puree Supervision: Full supervision/cueing for compensatory strategies;Staff to assist with self feeding Compensations: Minimize environmental distractions;Slow rate;Small sips/bites Postural Changes: Seated upright at 90 degrees;Remain upright for at least 30 minutes after po intake    Other  Recommendations Oral Care Recommendations: Oral care BID   Follow up Recommendations  (tba)      Frequency and Duration min 2x/week  2 weeks       Prognosis Prognosis for Safe Diet Advancement: Good Barriers to Reach Goals: Cognitive deficits      Swallow Study   General HPI: 46  y.o.male wtih PMH of HTN and DM who presents with elevated BP with systolic blood pressure in the 242A and diastolic 834. CT brain showed a right thalamic intracerebral hemorrhage with mild  Type of Study: Bedside Swallow Evaluation Previous Swallow Assessment: none in chart Diet Prior to this Study: NPO Temperature Spikes Noted: Yes (100.2) Respiratory Status: Room air History of Recent Intubation: No Behavior/Cognition: Cooperative;Lethargic/Drowsy;Requires cueing Oral Cavity Assessment: Within Functional Limits Oral Care Completed by SLP: Yes Oral Cavity - Dentition: Missing dentition Vision:  (needs cues to keep eyes opened) Self-Feeding Abilities: Needs assist Patient Positioning: Upright in bed Baseline Vocal Quality: Normal Volitional Cough: Strong Volitional Swallow: Able to elicit    Oral/Motor/Sensory Function Overall Oral Motor/Sensory Function: Within functional limits   Ice Chips Ice chips: Within functional limits Presentation: Spoon   Thin Liquid Thin Liquid: Within functional limits Presentation: Cup;Self Fed;Straw;Spoon    Nectar Thick Nectar Thick Liquid: Not tested   Honey Thick Honey Thick Liquid: Not tested   Puree Puree: Within functional limits Presentation: Spoon   Solid   GO   Solid: Impaired Presentation: Self Fed Oral Phase Impairments: Impaired mastication (likely related to large bolus and missing dentition)        Germain Osgood 02/04/2016,10:09 AM  Germain Osgood, M.A. CCC-SLP [phone]

## 2016-02-04 NOTE — Progress Notes (Signed)
*  PRELIMINARY RESULTS* Vascular Ultrasound Carotid Duplex (Doppler) has been completed.  Preliminary findings: Bilateral 1-39% ICA stenosis, antegrade vertebral flow. Difficult exam due to patient cooperation, snoring and body habitus.   Everrett Coombe 02/04/2016, 2:11 PM

## 2016-02-04 NOTE — Progress Notes (Signed)
STROKE TEAM PROGRESS NOTE   HISTORY OF PRESENT ILLNESS (per record) Adam Vincent is an 46 y.o. male with known hyperlipidemia and hypertension who does not see a PCP. Patient states at that he was going to the bathroom multiple times last night when about 12:00a (LKW 02/02/2016 at 2400) he noted a sudden onset of headache. That morning he woke up and he noted that he is weaker in his left arm and his left leg. He also continued to have a headache. He had no other complaints. Patient was brought to the emergency department where CT of the brain showed intraparenchymal hemorrhage seen in the right thalamus with surrounding white matter edema. Currently blood pressure is being maintained with Cardene. Neurology was asked to consult. Patient was not administered IV t-PA secondary to [place]. He was admitted to the neuro ICU for further evaluation and treatment.   SUBJECTIVE (INTERVAL HISTORY) Ex-wife at bedside. Pt still has high BP and maxed out on cleviprex. Will add labetolol infusion. Passed swallow will also add po meds. Repeat CT did not showed hematoma enlargement.    OBJECTIVE Temp:  [98 F (36.7 C)-100.2 F (37.9 C)] 98.5 F (36.9 C) (01/30 0800) Pulse Rate:  [66-105] 91 (01/30 0937) Cardiac Rhythm: Normal sinus rhythm (01/30 0400) Resp:  [0-46] 16 (01/30 0937) BP: (109-234)/(65-178) 175/85 (01/30 0937) SpO2:  [92 %-100 %] 100 % (01/30 0937) Weight:  [167.9 kg (370 lb 2.4 oz)-176.9 kg (390 lb)] 167.9 kg (370 lb 2.4 oz) (01/29 1800)  CBC:  Recent Labs Lab 02/03/16 1542 02/03/16 1551 02/04/16 0742  WBC 9.7  --  12.1*  NEUTROABS 7.1  --   --   HGB 13.7 15.6 13.5  HCT 42.8 46.0 42.6  MCV 85.4  --  85.9  PLT 272  --  203    Basic Metabolic Panel:  Recent Labs Lab 02/03/16 1542 02/03/16 1551 02/04/16 0742  NA 138 140 139  K 3.8 3.8 3.8  CL 102 101 104  CO2 27  --  24  GLUCOSE 170* 164* 173*  BUN 22* 26* 25*  CREATININE 1.87* 1.90* 2.14*  CALCIUM 9.2  --  8.9    Lipid  Panel:    Component Value Date/Time   CHOL 153 02/04/2016 0742   TRIG 174 (H) 02/04/2016 0742   HDL 40 (L) 02/04/2016 0742   CHOLHDL 3.8 02/04/2016 0742   VLDL 35 02/04/2016 0742   LDLCALC 78 02/04/2016 0742   HgbA1c:  Lab Results  Component Value Date   HGBA1C (H) 04/22/2010    10.5 (NOTE)                                                                       According to the ADA Clinical Practice Recommendations for 2011, when HbA1c is used as a screening test:   >=6.5%   Diagnostic of Diabetes Mellitus           (if abnormal result  is confirmed)  5.7-6.4%   Increased risk of developing Diabetes Mellitus  References:Diagnosis and Classification of Diabetes Mellitus,Diabetes TDHR,4163,84(TXMIW 1):S62-S69 and Standards of Medical Care in         Diabetes - 2011,Diabetes Care,2011,34  (Suppl 1):S11-S61.   Urine Drug Screen: No results found  for: LABOPIA, COCAINSCRNUR, LABBENZ, AMPHETMU, THCU, LABBARB    IMAGING I have personally reviewed the radiological images below and agree with the radiology interpretations.  Ct Head Wo Contrast 02/04/2016 Evolving 11 x 30 mm RIGHT caudal thalamic hematoma resulting in mild obstructive hydrocephalus. Severe white matter changes most compatible with chronic hypertensive encephalopathy, recommend MRI of the head on nonemergent basis for further characterization.   02/03/2016 Interval development of intraparenchymal hemorrhage seen in the right thalamus with surrounding white matter edema. Mild dilatation of lateral ventricles is noted.   MRI and MRA pending  2D echo pending  CUS - Bilateral 1-39% ICA stenosis, antegrade vertebral flow. Difficult exam due to patient cooperation, snoring and body habitus.   PHYSICAL EXAM  Temp:  [98 F (36.7 C)-100.2 F (37.9 C)] 98.7 F (37.1 C) (01/30 1600) Pulse Rate:  [73-105] 77 (01/30 1630) Resp:  [0-46] 17 (01/30 1630) BP: (109-206)/(59-165) 138/69 (01/30 1630) SpO2:  [92 %-100 %] 100 % (01/30  1630)  General - Well nourished, well developed, in no apparent distress, Drowsy sleepy.  Ophthalmologic - Fundi not visualized due to noncooperation  Cardiovascular - Regular rate and rhythm.  Mental Status -  Level of arousal and orientation to time, place, and person were intact. Language including expression, naming, repetition, comprehension was assessed and found intact. Mild dysarthria Fund of Knowledge was assessed and was intact.  Cranial Nerves II - XII - II - Visual field intact OU. III, IV, VI - Extraocular movements intact. V - Facial sensation intact bilaterally. VII - left facial droop. VIII - Hearing & vestibular intact bilaterally. X - Palate elevates symmetrically, mild dysarthria. XI - Chin turning & shoulder shrug intact bilaterally. XII - Tongue protrusion intact.  Motor Strength - The patient's strength was normal in RUE and RLE, but 4/5 LUE and LLE and pronator drift was present.  Bulk was normal and fasciculations were absent.   Motor Tone - Muscle tone was assessed at the neck and appendages and was normal.  Reflexes - The patient's reflexes were 1+ in all extremities and he had no pathological reflexes.  Sensory - Light touch, temperature/pinprick were assessed and were symmetrical.    Coordination - The patient had normal movements in the left hand FTN dysmetria, but not out of proportional to weakness.  Tremor was absent.  Gait and Station - deferred   ASSESSMENT/PLAN [name] is a 46 y.o. male with history of hypertension and hyperlipidemia presenting with headache and left-sided weakness. CT showed a right thalamic intracerebral hemorrhage in setting of elevated blood pressure. He was started on IV Cardene admitted to the neuro ICU.  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   MRI   pending   MRA  pending   Carotid Doppler  unremarkable   2D Echo  pending   LDL 78  HgbA1c pending  SCDs for VTE prophylaxis  DIET DYS 3 Room service appropriate? Yes; Fluid consistency: Thin  aspirin 81 mg daily prior to admission, now on No antithrombotic secondary to hemorrhage  Ongoing aggressive stroke risk factor management  Therapy recommendations:  Pending  Disposition:  Pending  Hypertensive emergency  Blood pressure 211/115 on arrival in setting of neurologic symptoms   Started on Cardene in the ED for control, changed to Cleviprex  Blood pressure remains elevated with systolics in the 301S this a.m.  Put on labetalol drip  SBP goal < 140  Also put on by mouth meds with labetalol, amlodipine and hydralazine  Taper off Cleviprex and labetalol as able  Hyperlipidemia  Home meds:  No statin  LDL 78  Add Lipitor 10  Continue statin on discharge  Diabetes  Hyperglycemia   Check CBGs every 4 hours   SSI  HgbA1c pending , goal < 7.0  Uncontrolled  Add Lantus 10 units daily at bedtime  CKD  Creatinine 2.14  Likely due to uncontrolled HTN and DM  Other Stroke Risk Factors  Marijuana user, 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   Morbid Obesity, Body mass index is 48.84 kg/m., recommend weight loss, diet and exercise as appropriate   Other Active Problems  Leukocytosis 12.1     Hospital day # 1  This patient is critically ill due to [place] with mild hydrocephalus, hypertensive emergency, uncontrolled diabetes and at significant risk of neurological worsening, death form hematoma extension, obstructive hydrocephalus, heart failure, DKA, and brain death. This patient's care requires constant monitoring of vital signs, hemodynamics, respiratory and cardiac monitoring, review of multiple databases, neurological assessment, discussion with family, other specialists and medical decision making of high complexity. I spent 45 minutes of neurocritical care time in  the care of this patient.  Rosalin Hawking, MD PhD Stroke Neurology 02/04/2016 6:16 PM   To contact Stroke Continuity provider, please refer to [email or web address] After hours, contact General Neurology

## 2016-02-04 NOTE — Progress Notes (Signed)
PT Cancellation Note  Patient Details Name: PREVIN JIAN MRN: [id number] DOB: 05-14-1970   Cancelled Treatment:    Reason Eval/Treat Not Completed: Patient not medically ready Pt on bedrest. Will await increase In activity orders prior to PT evaluation. Will follow.   Marguarite Arbour A Keviana Guida 02/04/2016, 8:49 AM Wray Kearns, PT, DPT [phone]

## 2016-02-04 NOTE — Progress Notes (Signed)
OT Cancellation Note  Patient Details Name: Adam Vincent MRN: [id number] DOB: 1970-07-09   Cancelled Treatment:    Reason Eval/Treat Not Completed: Medical issues which prohibited therapy (bedrest order set)  Peri Maris  [phone] 02/04/2016, 7:09 AM

## 2016-02-05 DIAGNOSIS — I615 Nontraumatic intracerebral hemorrhage, intraventricular: Secondary | ICD-10-CM

## 2016-02-05 DIAGNOSIS — G936 Cerebral edema: Secondary | ICD-10-CM

## 2016-02-05 DIAGNOSIS — I161 Hypertensive emergency: Secondary | ICD-10-CM

## 2016-02-05 LAB — BASIC METABOLIC PANEL
Anion gap: 9 (ref 5–15)
BUN: 25 mg/dL — ABNORMAL HIGH (ref 6–20)
CALCIUM: 8.3 mg/dL — AB (ref 8.9–10.3)
CO2: 21 mmol/L — ABNORMAL LOW (ref 22–32)
Chloride: 105 mmol/L (ref 101–111)
Creatinine, Ser: 2.22 mg/dL — ABNORMAL HIGH (ref 0.61–1.24)
GFR, EST AFRICAN AMERICAN: 40 mL/min — AB (ref >60)
GFR, EST NON AFRICAN AMERICAN: 35 mL/min — AB (ref >60)
Glucose, Bld: 166 mg/dL — ABNORMAL HIGH (ref 65–99)
POTASSIUM: 4.6 mmol/L (ref 3.5–5.1)
Sodium: 135 mmol/L (ref 135–145)

## 2016-02-05 LAB — GLUCOSE, CAPILLARY
GLUCOSE-CAPILLARY: 152 mg/dL — AB (ref 65–99)
GLUCOSE-CAPILLARY: 155 mg/dL — AB (ref 65–99)
GLUCOSE-CAPILLARY: 166 mg/dL — AB (ref 65–99)
GLUCOSE-CAPILLARY: 181 mg/dL — AB (ref 65–99)
GLUCOSE-CAPILLARY: 198 mg/dL — AB (ref 65–99)
Glucose-Capillary: 188 mg/dL — ABNORMAL HIGH (ref 65–99)

## 2016-02-05 LAB — VAS US CAROTID
LCCAPDIAS: 13 cm/s
LCCAPSYS: 99 cm/s
LEFT VERTEBRAL DIAS: 12 cm/s
Left CCA dist dias: -15 cm/s
Left CCA dist sys: -92 cm/s
Left ICA dist dias: -17 cm/s
Left ICA dist sys: -70 cm/s
Left ICA prox dias: 18 cm/s
Left ICA prox sys: 100 cm/s
RCCADSYS: -70 cm/s
RIGHT VERTEBRAL DIAS: 10 cm/s
Right CCA prox dias: 38 cm/s
Right CCA prox sys: 156 cm/s

## 2016-02-05 LAB — RAPID URINE DRUG SCREEN, HOSP PERFORMED
AMPHETAMINES: NOT DETECTED
BARBITURATES: NOT DETECTED
BENZODIAZEPINES: NOT DETECTED
Cocaine: NOT DETECTED
Opiates: POSITIVE — AB
Tetrahydrocannabinol: POSITIVE — AB

## 2016-02-05 LAB — CBC
HCT: 38.1 % — ABNORMAL LOW (ref 39.0–52.0)
Hemoglobin: 12.2 g/dL — ABNORMAL LOW (ref 13.0–17.0)
MCH: 27.1 pg (ref 26.0–34.0)
MCHC: 32 g/dL (ref 30.0–36.0)
MCV: 84.7 fL (ref 78.0–100.0)
PLATELETS: 205 10*3/uL (ref 150–400)
RBC: 4.5 MIL/uL (ref 4.22–5.81)
RDW: 15.3 % (ref 11.5–15.5)
WBC: 11.6 10*3/uL — AB (ref 4.0–10.5)

## 2016-02-05 LAB — HEMOGLOBIN A1C
HEMOGLOBIN A1C: 7.9 % — AB (ref 4.8–5.6)
Mean Plasma Glucose: 180 mg/dL

## 2016-02-05 MED ORDER — INSULIN GLARGINE 100 UNIT/ML ~~LOC~~ SOLN
15.0000 [IU] | Freq: Every day | SUBCUTANEOUS | Status: DC
  Administered 2016-02-05 – 2016-02-07 (×3): 15 [IU] via SUBCUTANEOUS
  Filled 2016-02-05 (×3): qty 0.15

## 2016-02-05 MED ORDER — METFORMIN HCL 500 MG PO TABS
500.0000 mg | ORAL_TABLET | Freq: Every day | ORAL | Status: DC
  Administered 2016-02-07 – 2016-02-26 (×19): 500 mg via ORAL
  Filled 2016-02-05 (×20): qty 1

## 2016-02-05 MED ORDER — HYDRALAZINE HCL 50 MG PO TABS
50.0000 mg | ORAL_TABLET | Freq: Three times a day (TID) | ORAL | Status: DC
  Administered 2016-02-05 – 2016-02-06 (×3): 50 mg via ORAL
  Filled 2016-02-05 (×3): qty 1

## 2016-02-05 MED ORDER — INSULIN ASPART 100 UNIT/ML ~~LOC~~ SOLN
0.0000 [IU] | Freq: Three times a day (TID) | SUBCUTANEOUS | Status: DC
  Administered 2016-02-05 – 2016-02-07 (×11): 3 [IU] via SUBCUTANEOUS
  Administered 2016-02-08: 8 [IU] via SUBCUTANEOUS
  Administered 2016-02-08: 3 [IU] via SUBCUTANEOUS
  Administered 2016-02-08 (×2): 5 [IU] via SUBCUTANEOUS
  Administered 2016-02-09: 2 [IU] via SUBCUTANEOUS
  Administered 2016-02-09 (×2): 5 [IU] via SUBCUTANEOUS
  Administered 2016-02-09 – 2016-02-10 (×2): 3 [IU] via SUBCUTANEOUS
  Administered 2016-02-10 – 2016-02-11 (×5): 5 [IU] via SUBCUTANEOUS
  Administered 2016-02-11: 2 [IU] via SUBCUTANEOUS
  Administered 2016-02-11: 5 [IU] via SUBCUTANEOUS
  Administered 2016-02-12: 2 [IU] via SUBCUTANEOUS
  Administered 2016-02-12 (×3): 5 [IU] via SUBCUTANEOUS
  Administered 2016-02-13 (×2): 3 [IU] via SUBCUTANEOUS

## 2016-02-05 NOTE — Evaluation (Signed)
Occupational Therapy Evaluation Patient Details Name: Adam Vincent MRN: [id number] DOB: 1970-08-24 Today's Date: 02/05/2016    History of Present Illness This 46 y.o. male admitted with generalized weakness and cough.  PMH includes:  CAD with multiple stents, COPD on 2L 02, HTN, CKDIII, parosysmal A-fib, h/o CVA, syncope, PNA, h/o substance abuse, conversion, anxiety disorder, cognitive impairment.    Clinical Impression   Pt admitted with above. He demonstrates the below listed deficits and will benefit from continued OT to maximize safety and independence with BADLs.  Pt presents to OT with decreased arousal/attention, impaired cognition, Lt sided ataxia, impaired balance.   Unable to assess vision as pt keeps eyes closed opening them just briefly.  He requires total A for ADLs and mod A +2 for functional transfers.  Recommend CIR.       Follow Up Recommendations  CIR;Supervision/Assistance - 24 hour    Equipment Recommendations  Tub/shower bench;3 in 1 bedside commode    Recommendations for Other Services Rehab consult     Precautions / Restrictions Precautions Precautions: Fall Precaution Comments: Pt ataxic Lt side       Mobility Bed Mobility Overal bed mobility: Needs Assistance Bed Mobility: Supine to Sit     Supine to sit: Mod assist;+2 for physical assistance     General bed mobility comments: step by step cues.  Assist to move LEs off bed and to lift trunk from bed   Transfers Overall transfer level: Needs assistance   Transfers: Sit to/from Stand;Stand Pivot Transfers Sit to Stand: Mod assist;+2 physical assistance Stand pivot transfers: Mod assist;+2 physical assistance       General transfer comment: Step by step verbal cues. Assist to move into standing and assist for balance and to prevent Lt knee buckling    Balance Overall balance assessment: Needs assistance Sitting-balance support: Feet supported Sitting balance-Leahy Scale:  Poor Sitting balance - Comments: requires min A, progressing to mod A.  Leans to Lt  Postural control: Left lateral lean Standing balance support: Bilateral upper extremity supported Standing balance-Leahy Scale: Poor Standing balance comment: requires mod A +2                             ADL Overall ADL's : Needs assistance/impaired Eating/Feeding: Maximal assistance;Sitting   Grooming: Wash/dry hands;Wash/dry face;Oral care;Brushing hair;Maximal assistance;Sitting Grooming Details (indicate cue type and reason): max cues to initiate activity and assist to complete task  Upper Body Bathing: Total assistance;Bed level   Lower Body Bathing: Total assistance;Bed level   Upper Body Dressing : Total assistance;Bed level   Lower Body Dressing: Total assistance;Bed level   Toilet Transfer: Moderate assistance;+2 for physical assistance;Stand-pivot;BSC Toilet Transfer Details (indicate cue type and reason): Step by step verbal cues. Assist to move into standing and assist for balance and to prevent Lt knee buckling  Toileting- Clothing Manipulation and Hygiene: Total assistance;Sit to/from stand       Functional mobility during ADLs: Moderate assistance;+2 for physical assistance       Vision Additional Comments: Pt keeps eyes closed, unable to assess    Perception Perception Comments: Unable to accurately assess due to lethargy    Praxis Praxis Praxis-Other Comments: unable to accurately assess due to lethargy     Pertinent Vitals/Pain Pain Assessment: No/denies pain     Hand Dominance Right   Extremity/Trunk Assessment Upper Extremity Assessment Upper Extremity Assessment: LUE deficits/detail LUE Deficits / Details: ataxia noted.  Strength grossly  4-/5 LUE Sensation: decreased proprioception LUE Coordination: decreased fine motor;decreased gross motor   Lower Extremity Assessment Lower Extremity Assessment: Defer to PT evaluation   Cervical / Trunk  Assessment Cervical / Trunk Assessment: Other exceptions Cervical / Trunk Exceptions: decreased trunk control    Communication Communication Communication: Expressive difficulties   Cognition Arousal/Alertness: Lethargic Behavior During Therapy: Impulsive;Flat affect Overall Cognitive Status: Impaired/Different from baseline Area of Impairment: Orientation;Attention;Memory;Following commands;Safety/judgement;Awareness;Problem solving Orientation Level: Disoriented to;Time Current Attention Level: Focused Memory: Decreased short-term memory Following Commands: Follows one step commands inconsistently Safety/Judgement: Decreased awareness of safety;Decreased awareness of deficits Awareness: Intellectual Problem Solving: Slow processing;Decreased initiation;Difficulty sequencing;Requires verbal cues;Requires tactile cues General Comments: Pt requires mod cues to follow one step commands.  Verbal perseveration noted. He states it's 1994 (when given choices).  He think Tawni Pummel is president.  Keeps eyes closed 99% of the time.     General Comments       Exercises       Shoulder Instructions      Home Living Family/patient expects to be discharged to:: Private residence Living Arrangements: Spouse/significant other;Other relatives Available Help at Discharge: Family;Available 24 hours/day Type of Home: House Home Access: Stairs to enter [employer] of Steps: 3 Entrance Stairs-Rails: Right Home Layout: One level     Bathroom Shower/Tub: Tub/shower unit;Curtain Shower/tub characteristics: Curtain       Home Equipment: None   Additional Comments: Pt's significant other works nights, but may switch to days.  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          OT Problem List: Decreased strength;Impaired balance  (sitting and/or standing);Decreased activity tolerance;Decreased coordination;Decreased cognition;Impaired vision/perception;Decreased safety awareness;Decreased knowledge of use of DME or AE;Decreased knowledge of precautions;Cardiopulmonary status limiting activity;Impaired sensation;Obesity;Impaired UE functional use   OT Treatment/Interventions: Self-care/ADL training;Neuromuscular education;DME and/or AE instruction;Therapeutic activities;Cognitive remediation/compensation;Visual/perceptual remediation/compensation;Patient/family education;Balance training    OT Goals(Current goals can be found in the care plan section) Acute Rehab OT Goals Patient Stated Goal: for pt to improve and go home  OT Goal Formulation: With family Time For Goal Achievement: 02/19/16 Potential to Achieve Goals: Good ADL Goals Pt Will Perform Eating: with supervision;with set-up;sitting Pt Will Perform Grooming: with set-up;sitting;with supervision Pt Will Perform Upper Body Bathing: with min assist;sitting Pt Will Perform Lower Body Bathing: with mod assist;sit to/from stand Pt Will Transfer to Toilet: with mod assist;stand pivot transfer;bedside commode Additional ADL Goal #1: Pt will demonstrate sustained attention x 5 mins with min cues during ADLs  Additional ADL Goal #2: Pt will be oriented x 4 with use of external cues  Additional ADL Goal #3: Pt will maintain adequate eye opening to complete visual assessment   OT Frequency: Min 3X/week   Barriers to D/C:            Co-evaluation              End of Session Equipment Utilized During Treatment: Gait belt Nurse Communication: Mobility status  Activity Tolerance: Patient limited by lethargy Patient left: in chair;with call bell/phone within reach;with chair alarm set;with family/visitor present;with nursing/sitter in room   Time: 1126-1214 OT Time Calculation (min): 48 min Charges:  OT General Charges $OT Visit: 1 Procedure OT  Evaluation $OT Eval Moderate Complexity: 1 Procedure OT Treatments $Neuromuscular Re-education: 23-37 mins G-Codes:    Bert Givans, Ellard Artis M Feb 29, 2016,  3:53 PM

## 2016-02-05 NOTE — Progress Notes (Signed)
PT Cancellation Note  Patient Details Name: Adam Vincent MRN: [id number] DOB: [birth year]   Cancelled Treatment:    Reason Eval/Treat Not Completed: Patient not medically ready. Pt remains on strict bedrest.   Eran Mistry M Miken Stecher 02/05/2016, 7:15 AM   Kittie Plater, PT, DPT Pager #: [phone] Office #: [phone]

## 2016-02-05 NOTE — Progress Notes (Signed)
OT Cancellation Note  Patient Details Name: Adam Vincent MRN: [id number] DOB: Feb 11, 1970   Cancelled Treatment:    Reason Eval/Treat Not Completed: Medical issues which prohibited therapy (bedrest order set)  Peri Maris  [phone]  02/05/2016, 7:09 AM

## 2016-02-05 NOTE — Progress Notes (Signed)
STROKE TEAM PROGRESS NOTE   HISTORY OF PRESENT ILLNESS (per record) Adam Vincent is an 46 y.o. male with known hyperlipidemia and hypertension who does not see a PCP. Patient states at that he was going to the bathroom multiple times last night when about 12:00a (LKW 02/02/2016 at 2400) he noted a sudden onset of headache. That morning he woke up and he noted that he is weaker in his left arm and his left leg. He also continued to have a headache. He had no other complaints. Patient was brought to the emergency department where CT of the brain showed intraparenchymal hemorrhage seen in the right thalamus with surrounding white matter edema. Currently blood pressure is being maintained with Cardene. Neurology was asked to consult. Patient was not administered IV t-PA secondary to [place]. He was admitted to the neuro ICU for further evaluation and treatment.   SUBJECTIVE (INTERVAL HISTORY) Ex-wife at bedside. Pt was lying in bed and Ex-wife is feeding him. He still mildly drowsy and sleepy. MRI done this am showed similar hematoma and hydrocephalus. BP still high on cleviprex. Will increase BP meds in hoping to taper off cleviprex.     OBJECTIVE Temp:  [97.8 F (36.6 C)-100.1 F (37.8 C)] 99.9 F (37.7 C) (01/31 1200) Pulse Rate:  [66-86] 79 (01/31 1230) Cardiac Rhythm: Normal sinus rhythm (01/31 0400) Resp:  [13-26] 20 (01/31 1230) BP: (111-168)/(58-88) 119/64 (01/31 1336) SpO2:  [94 %-100 %] 98 % (01/31 1230) Weight:  [380 lb 11.8 oz (172.7 kg)] 380 lb 11.8 oz (172.7 kg) (01/31 0500)  CBC:  Recent Labs Lab 02/03/16 1542  02/04/16 0742 02/05/16 0250  WBC 9.7  --  12.1* 11.6*  NEUTROABS 7.1  --   --   --   HGB 13.7  < > 13.5 12.2*  HCT 42.8  < > 42.6 38.1*  MCV 85.4  --  85.9 84.7  PLT 272  --  255 205  < > = values in this interval not displayed.  Basic Metabolic Panel:   Recent Labs Lab 02/04/16 0742 02/05/16 0250  NA 139 135  K 3.8 4.6  CL 104 105  CO2 24 21*  GLUCOSE  173* 166*  BUN 25* 25*  CREATININE 2.14* 2.22*  CALCIUM 8.9 8.3*    Lipid Panel:     Component Value Date/Time   CHOL 153 02/04/2016 0742   TRIG 174 (H) 02/04/2016 0742   HDL 40 (L) 02/04/2016 0742   CHOLHDL 3.8 02/04/2016 0742   VLDL 35 02/04/2016 0742   LDLCALC 78 02/04/2016 0742   HgbA1c:  Lab Results  Component Value Date   HGBA1C 7.9 (H) 02/04/2016   Urine Drug Screen:     Component Value Date/Time   LABOPIA POSITIVE (A) 02/04/2016 0530   COCAINSCRNUR NONE DETECTED 02/04/2016 0530   LABBENZ NONE DETECTED 02/04/2016 0530   AMPHETMU NONE DETECTED 02/04/2016 0530   THCU POSITIVE (A) 02/04/2016 0530   LABBARB NONE DETECTED 02/04/2016 0530      IMAGING I have personally reviewed the radiological images below and agree with the radiology interpretations.  Ct Head Wo Contrast 02/04/2016 Evolving 11 x 30 mm RIGHT caudal thalamic hematoma resulting in mild obstructive hydrocephalus. Severe white matter changes most compatible with chronic hypertensive encephalopathy, recommend MRI of the head on nonemergent basis for further characterization.   02/03/2016 Interval development of intraparenchymal hemorrhage seen in the right thalamus with surrounding white matter edema. Mild dilatation of lateral ventricles is noted.   Mri and Mra  Brain Wo Contrast 02/05/2016 IMPRESSION: MRI HEAD: Evolving RIGHT thalamic hematoma. Effaced 3rd ventricle with mild acute hydrocephalus. Bilateral sub centimeter acute watershed territory infarcts. Moderate to severe white matter changes consistent with interstitial edema and chronic small vessel ischemic disease. MRA HEAD: No acute vascular process. Mildly ectatic basilar artery.   2D echo pending  CUS - Bilateral 1-39% ICA stenosis, antegrade vertebral flow. Difficult exam due to patient cooperation, snoring and body habitus.   PHYSICAL EXAM  Temp:  [97.8 F (36.6 C)-100.1 F (37.8 C)] 99.9 F (37.7 C) (01/31 1200) Pulse Rate:  [66-86]  79 (01/31 1230) Resp:  [13-26] 20 (01/31 1230) BP: (111-168)/(58-88) 119/64 (01/31 1336) SpO2:  [94 %-100 %] 98 % (01/31 1230) Weight:  [380 lb 11.8 oz (172.7 kg)] 380 lb 11.8 oz (172.7 kg) (01/31 0500)  General - Well nourished, well developed, in no apparent distress, Drowsy sleepy.  Ophthalmologic - Fundi not visualized due to noncooperation  Cardiovascular - Regular rate and rhythm.  Mental Status -  Level of arousal and orientation to time, place, and person were intact. Language including expression, naming, repetition, comprehension was assessed and found intact. Mild dysarthria Fund of Knowledge was assessed and was intact.  Cranial Nerves II - XII - II - Visual field intact OU. III, IV, VI - Extraocular movements intact. V - Facial sensation intact bilaterally. VII - left facial droop. VIII - Hearing & vestibular intact bilaterally. X - Palate elevates symmetrically, mild dysarthria. XI - Chin turning & shoulder shrug intact bilaterally. XII - Tongue protrusion intact.  Motor Strength - The patient's strength was normal in RUE and RLE, but 4/5 LUE and LLE and pronator drift was present.  Bulk was normal and fasciculations were absent.   Motor Tone - Muscle tone was assessed at the neck and appendages and was normal.  Reflexes - The patient's reflexes were 1+ in all extremities and he had no pathological reflexes.  Sensory - Light touch, temperature/pinprick were assessed and were symmetrical.    Coordination - The patient had normal movements in the left hand FTN dysmetria, but not out of proportional to weakness.  Tremor was absent.  Gait and Station - deferred   ASSESSMENT/PLAN [name] is a 46 y.o. male with history of hypertension and hyperlipidemia presenting with headache and left-sided weakness. CT showed a right thalamic intracerebral hemorrhage in setting of elevated blood pressure. He was started on IV Cardene admitted to the neuro ICU.  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   MRI - evolving RIGHT thalamic hematoma. Mild acute hydrocephalus. Bilateral sub centimeter acute watershed territory infarcts.   MRA  unremarkable   Carotid Doppler  unremarkable   2D Echo  pending   LDL 78  HgbA1c 7.9  SCDs for VTE prophylaxis DIET DYS 3 Room service appropriate? Yes; Fluid consistency: Thin  aspirin 81 mg daily prior to admission, now on No antithrombotic secondary to hemorrhage  Ongoing aggressive stroke risk factor management  Therapy recommendations:  Pending  Disposition:  Pending  Hypertensive emergency  Blood pressure 211/115 on arrival in setting of neurologic symptoms   Started on Cardene in the ED for control, changed to Cleviprex  off labetalol drip  SBP goal < 140  on po meds with labetalol, amlodipine and hydralazine  Taper off Cleviprex as able  Hyperlipidemia  Home meds:  No statin  LDL 78  Add Lipitor 10  Continue statin on discharge  Diabetes  Hyperglycemia   Check CBGs every 4 hours   SSI  HgbA1c 7.9 , goal < 7.0  Uncontrolled  increase Lantus to 15 units daily at bedtime  CKD, stage III  Creatinine 2.14->2.22  Likely due to uncontrolled HTN and DM  Encourage po intake  Other Stroke Risk Factors  Marijuana user, 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   Morbid Obesity, Body mass index is 50.23 kg/m., recommend weight loss, diet and exercise as appropriate   Likely undiagnosed OSA  Other Active Problems  Leukocytosis 12.1 -> 11.6    Hospital day # 2  This patient is critically ill due to [place] with mild hydrocephalus, hypertensive emergency, uncontrolled diabetes and at significant risk of neurological worsening, death form hematoma extension, obstructive hydrocephalus, heart failure, DKA, and brain death. This patient's care  requires constant monitoring of vital signs, hemodynamics, respiratory and cardiac monitoring, review of multiple databases, neurological assessment, discussion with family, other specialists and medical decision making of high complexity. I spent 35 minutes of neurocritical care time in the care of this patient.  Rosalin Hawking, MD PhD Stroke Neurology 02/05/2016 2:33 PM   To contact Stroke Continuity provider, please refer to [email or web address] After hours, contact General Neurology

## 2016-02-06 ENCOUNTER — Inpatient Hospital Stay (HOSPITAL_COMMUNITY): Payer: Medicaid Other

## 2016-02-06 DIAGNOSIS — I6789 Other cerebrovascular disease: Secondary | ICD-10-CM

## 2016-02-06 DIAGNOSIS — R0600 Dyspnea, unspecified: Secondary | ICD-10-CM

## 2016-02-06 DIAGNOSIS — I638 Other cerebral infarction: Secondary | ICD-10-CM

## 2016-02-06 LAB — BASIC METABOLIC PANEL
ANION GAP: 7 (ref 5–15)
BUN: 22 mg/dL — ABNORMAL HIGH (ref 6–20)
CHLORIDE: 104 mmol/L (ref 101–111)
CO2: 23 mmol/L (ref 22–32)
Calcium: 8.5 mg/dL — ABNORMAL LOW (ref 8.9–10.3)
Creatinine, Ser: 1.93 mg/dL — ABNORMAL HIGH (ref 0.61–1.24)
GFR calc non Af Amer: 41 mL/min — ABNORMAL LOW (ref >60)
GFR, EST AFRICAN AMERICAN: 47 mL/min — AB (ref >60)
Glucose, Bld: 184 mg/dL — ABNORMAL HIGH (ref 65–99)
Potassium: 4.3 mmol/L (ref 3.5–5.1)
Sodium: 134 mmol/L — ABNORMAL LOW (ref 135–145)

## 2016-02-06 LAB — CBC
HEMATOCRIT: 36 % — AB (ref 39.0–52.0)
HEMOGLOBIN: 11.3 g/dL — AB (ref 13.0–17.0)
MCH: 26.8 pg (ref 26.0–34.0)
MCHC: 31.4 g/dL (ref 30.0–36.0)
MCV: 85.3 fL (ref 78.0–100.0)
Platelets: 218 10*3/uL (ref 150–400)
RBC: 4.22 MIL/uL (ref 4.22–5.81)
RDW: 15.7 % — ABNORMAL HIGH (ref 11.5–15.5)
WBC: 9.6 10*3/uL (ref 4.0–10.5)

## 2016-02-06 LAB — GLUCOSE, CAPILLARY
GLUCOSE-CAPILLARY: 161 mg/dL — AB (ref 65–99)
Glucose-Capillary: 162 mg/dL — ABNORMAL HIGH (ref 65–99)
Glucose-Capillary: 168 mg/dL — ABNORMAL HIGH (ref 65–99)
Glucose-Capillary: 178 mg/dL — ABNORMAL HIGH (ref 65–99)
Glucose-Capillary: 199 mg/dL — ABNORMAL HIGH (ref 65–99)

## 2016-02-06 LAB — HEMOGLOBIN A1C
Hgb A1c MFr Bld: 7.6 % — ABNORMAL HIGH (ref 4.8–5.6)
MEAN PLASMA GLUCOSE: 171 mg/dL

## 2016-02-06 LAB — ECHOCARDIOGRAM COMPLETE
HEIGHTINCHES: 73 in
Weight: 6091.75 oz

## 2016-02-06 MED ORDER — PANTOPRAZOLE SODIUM 40 MG PO TBEC
40.0000 mg | DELAYED_RELEASE_TABLET | Freq: Every day | ORAL | Status: DC
  Administered 2016-02-10: 40 mg via ORAL
  Filled 2016-02-06: qty 1

## 2016-02-06 MED ORDER — ATORVASTATIN CALCIUM 10 MG PO TABS
10.0000 mg | ORAL_TABLET | Freq: Every day | ORAL | Status: DC
  Administered 2016-02-06 – 2016-02-25 (×18): 10 mg
  Filled 2016-02-06 (×20): qty 1

## 2016-02-06 MED ORDER — PERFLUTREN LIPID MICROSPHERE
INTRAVENOUS | Status: AC
Start: 2016-02-06 — End: 2016-02-06
  Filled 2016-02-06: qty 10

## 2016-02-06 MED ORDER — PANTOPRAZOLE SODIUM 40 MG IV SOLR
40.0000 mg | Freq: Every day | INTRAVENOUS | Status: DC
  Administered 2016-02-06 – 2016-02-13 (×7): 40 mg via INTRAVENOUS
  Filled 2016-02-06 (×8): qty 40

## 2016-02-06 MED ORDER — PANTOPRAZOLE SODIUM 40 MG PO TBEC: 40.0000 mg | DELAYED_RELEASE_TABLET | Freq: Every day | ORAL | Status: DC

## 2016-02-06 MED ORDER — SENNOSIDES-DOCUSATE SODIUM 8.6-50 MG PO TABS
1.0000 | ORAL_TABLET | Freq: Two times a day (BID) | ORAL | Status: DC
  Administered 2016-02-06 – 2016-02-18 (×17): 1
  Filled 2016-02-06 (×19): qty 1

## 2016-02-06 MED ORDER — LABETALOL HCL 200 MG PO TABS
300.0000 mg | ORAL_TABLET | Freq: Three times a day (TID) | ORAL | Status: DC
  Administered 2016-02-06 – 2016-02-18 (×36): 300 mg
  Filled 2016-02-06: qty 3
  Filled 2016-02-06: qty 1
  Filled 2016-02-06: qty 3
  Filled 2016-02-06: qty 1
  Filled 2016-02-06 (×2): qty 3
  Filled 2016-02-06 (×3): qty 1
  Filled 2016-02-06 (×2): qty 3
  Filled 2016-02-06: qty 1
  Filled 2016-02-06: qty 3
  Filled 2016-02-06 (×7): qty 1
  Filled 2016-02-06 (×5): qty 3
  Filled 2016-02-06: qty 1
  Filled 2016-02-06 (×5): qty 3
  Filled 2016-02-06 (×2): qty 1
  Filled 2016-02-06 (×5): qty 3
  Filled 2016-02-06: qty 1
  Filled 2016-02-06: qty 3

## 2016-02-06 MED ORDER — PERFLUTREN LIPID MICROSPHERE
1.0000 mL | INTRAVENOUS | Status: AC | PRN
  Administered 2016-02-06: 3 mL via INTRAVENOUS
  Filled 2016-02-06: qty 10

## 2016-02-06 MED ORDER — HEPARIN SODIUM (PORCINE) 5000 UNIT/ML IJ SOLN
5000.0000 [IU] | Freq: Three times a day (TID) | INTRAMUSCULAR | Status: DC
  Administered 2016-02-06 – 2016-02-26 (×58): 5000 [IU] via SUBCUTANEOUS
  Filled 2016-02-06 (×58): qty 1

## 2016-02-06 MED ORDER — HYDRALAZINE HCL 50 MG PO TABS
50.0000 mg | ORAL_TABLET | Freq: Three times a day (TID) | ORAL | Status: DC
  Administered 2016-02-06 – 2016-02-16 (×29): 50 mg
  Filled 2016-02-06 (×29): qty 1

## 2016-02-06 NOTE — Progress Notes (Signed)
Called Dr Erlinda Hong about patient's continued change in mental status.  Patient is barely arousable, will no longer follow commands, and has more of a right side gaze preference. STAT CT and MRI are ordered. Will follow up with Dr Erlinda Hong with results. [place], [place] C 02/06/16

## 2016-02-06 NOTE — Progress Notes (Signed)
Patient placed on CPAP for the night without complication. RT will continue to monitor.

## 2016-02-06 NOTE — Progress Notes (Signed)
Inpatient Rehabilitation  Per therapy request patient was screened by Gunnar Fusi for appropriateness for an Inpatient Acute Rehab consult.  At this time we are recommending an Inpatient Rehab consult.  MD paged; please order if you are agreeable.    Carmelia Roller., CCC/SLP Admission Coordinator  [facility]  Cell [phone]

## 2016-02-06 NOTE — Progress Notes (Signed)
SLP Cancellation Note  Patient Details Name: MACKINLEY KIEHN MRN: [id number] DOB: 1970-10-25   Cancelled treatment:       Reason Eval/Treat Not Completed: Fatigue/lethargy limiting ability to participate. Pt not sufficiently alert today for PO intake. Cortrak now placed. SLP to f/u on next date to assess readiness to resume POs.   Germain Osgood 02/06/2016, 2:08 PM  Germain Osgood, M.A. CCC-SLP [phone]

## 2016-02-06 NOTE — Progress Notes (Signed)
  Echocardiogram 2D Echocardiogram with Definity has been performed.  Adam Vincent 02/06/2016, 9:19 AM

## 2016-02-06 NOTE — Progress Notes (Signed)
Upon 0600 neuro check, patient neuro status changed, global aphagia, left side nonpurposeful and only w/d to pain. Wont follow commands. Neuro called, stat CT ordered.

## 2016-02-06 NOTE — Evaluation (Signed)
Physical Therapy Evaluation Patient Details Name: Adam Vincent MRN: [id number] DOB: Jul 23, 1970 Today's Date: 02/06/2016   History of Present Illness  This 46 y.o. male admitted with generalized weakness and cough. Head CT-Evolving RIGHT thalamic hemorrhage. MRI-Evolving RIGHT thalamic hematoma. Effaced 3rd ventricle with mild acute hydrocephalus. Bilateral sub centimeter acute watershed territory infarcts. PMH includes:  CAD with multiple stents, COPD on 2L 02, HTN, CKDIII, parosysmal A-fib, h/o CVA, syncope, PNA, h/o substance abuse, conversion, anxiety disorder, cognitive impairment.   Clinical Impression  Patient presents with impaired cognition/attention, right gaze preference, impaired sitting balance, speech difficulties and impaired mobility s/p above. Pt not following simple 1 step commands today. Able to get to midline with vision x1. Pt nonverbal throughout session and demonstrating some rooting like behaviors with tongue. Requires assist of 2 for static sitting balance. Grimaces with noxious stimuli in all extremities. Would benefit from CIR to maximize independence and mobility prior to return home. Will follow acutely.    Follow Up Recommendations CIR;Supervision/Assistance - 24 hour    Equipment Recommendations  Other (comment) (TBA)    Recommendations for Other Services Rehab consult     Precautions / Restrictions Precautions Precautions: Fall Precaution Comments: left hemiplegia Restrictions Weight Bearing Restrictions: No      Mobility  Bed Mobility Overal bed mobility: Needs Assistance Bed Mobility: Rolling;Supine to Sit;Sit to Supine Rolling: Total assist;+2 for physical assistance   Supine to sit: Total assist;+2 for physical assistance;+2 for safety/equipment;HOB elevated Sit to supine: Total assist;+2 for physical assistance;HOB elevated;+2 for safety/equipment   General bed mobility comments: Total A to bring LEs to EOB and elevate trunk; assist to bring  LEs into bed and lower trunk to return to supine. Rolling to right with assist of 2.  Transfers Overall transfer level:  (NA secondary to poor sitting balance/trunk control.)                  Ambulation/Gait                Stairs            Wheelchair Mobility    Modified Rankin (Stroke Patients Only) Modified Rankin (Stroke Patients Only) Pre-Morbid Rankin Score: No symptoms Modified Rankin: Severe disability     Balance Overall balance assessment: Needs assistance Sitting-balance support: Feet supported;Single extremity supported Sitting balance-Leahy Scale: Zero Sitting balance - Comments: Requires Max A for static sitting balance with 1 UE support. Sat EOB ~15 mins.                                     Pertinent Vitals/Pain Pain Assessment: Faces Faces Pain Scale: Hurts little more Pain Location: grimaces during pinches Pain Intervention(s): Limited activity within patient's tolerance;Repositioned;Monitored during session    [place] expects to be discharged to:: Private residence Living Arrangements: Spouse/significant other;Other relatives Available Help at Discharge: Family;Available 24 hours/day Type of Home: House Home Access: Stairs to enter Entrance Stairs-Rails: Right Entrance Stairs-Number of Steps: 3 Home Layout: One level Home Equipment: None Additional Comments: Pt's significant other works nights, but may switch to days.  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       Hand Dominance   Dominant Hand: Right    Extremity/Trunk Assessment   Upper  Extremity Assessment LUE Deficits / Details: No movement in LUE today.     Lower Extremity Assessment Lower Extremity Assessment: LLE deficits/detail;RLE deficits/detail RLE Deficits / Details: No purposeful movement  RLE except activation of hamstrings noted to bring leg back towards rail. Grimaces to noxious stimuli LLE Deficits / Details: No purposeful movement LLE; some activation of hamstrings noted with bringing leg back towards rail. Responds to noxious stimuli with grimaces.     Cervical / Trunk Assessment Cervical / Trunk Assessment: Other exceptions Cervical / Trunk Exceptions: decreased trunk control   Communication   Communication: Receptive difficulties;Expressive difficulties (nonverbal during session)  Cognition Arousal/Alertness: Lethargic Behavior During Therapy: Flat affect Overall Cognitive Status: Impaired/Different from baseline     Current Attention Level: Focused   Following Commands:  (not following commands)   Awareness: Intellectual   General Comments: Eyes open with right gaze during session. Able to get to midline x1 with max cues and once to left with painful stimuli. No verbalizations. Sticking tongue out almost in rooting like pattern.  Difficulty managing secretions.     General Comments General comments (skin integrity, edema, etc.): VSS. Pt with loud grunting noises synced up with breathing. Used music to arouse and engage patient in therapy session.    Exercises     Assessment/Plan    PT Assessment Patient needs continued PT services  PT Problem List Decreased strength;Decreased mobility;Impaired tone;Obesity;Decreased range of motion;Decreased activity tolerance;Decreased coordination;Impaired sensation;Decreased balance;Decreased cognition          PT Treatment Interventions Therapeutic activities;Gait training;Therapeutic exercise;Patient/family education;Balance training;Functional mobility training;Neuromuscular re-education;Stair training;Wheelchair mobility training;Cognitive remediation;DME instruction    PT Goals (Current goals can be found in the Care Plan section)  Acute Rehab PT Goals Patient Stated Goal: none stated as pt unable to at this  time. PT Goal Formulation: Patient unable to participate in goal setting Time For Goal Achievement: 02/20/16 Potential to Achieve Goals: Fair    Frequency Min 3X/week   Barriers to discharge        Co-evaluation PT/OT/SLP Co-Evaluation/Treatment: Yes Reason for Co-Treatment: Necessary to address cognition/behavior during functional activity;For patient/therapist safety;To address functional/ADL transfers           End of Session   Activity Tolerance: Patient limited by lethargy Patient left: in bed;with call bell/phone within reach;with bed alarm set;with nursing/sitter in room;with SCD's reapplied Nurse Communication: Need for lift equipment;Mobility status         Time: 9758-8325 PT Time Calculation (min) (ACUTE ONLY): 27 min   Charges:   PT Evaluation $PT Eval Moderate Complexity: 1 Procedure     PT G Codes:        Azlan Hanway A Niall Illes 02/06/2016, 3:58 PM Wray [place], [place], DPT [phone]

## 2016-02-06 NOTE — Progress Notes (Signed)
STROKE TEAM PROGRESS NOTE   SUBJECTIVE (INTERVAL HISTORY) Ex-wife at bedside. Drowsy sleepy and briefly open eyes on command. Barely able to make sounds, not able to have speech output. BP under control but cleviprex at 19mg /h. Not same for po at this time. Breathing more with snoring but able to protect airway. Oxygen saturation excellent. Repeat CT in a.m. no significant change.   OBJECTIVE Temp:  [97.8 F (36.6 C)-101 F (38.3 C)] 99.8 F (37.7 C) (02/01 1600) Pulse Rate:  [64-85] 78 (02/01 1600) Cardiac Rhythm: Normal sinus rhythm (02/01 0800) Resp:  [12-24] 13 (02/01 1600) BP: (113-199)/(60-185) 123/72 (02/01 1600) SpO2:  [96 %-100 %] 99 % (02/01 1600)  CBC:  Recent Labs Lab 02/03/16 1542  02/05/16 0250 02/06/16 0436  WBC 9.7  < > 11.6* 9.6  NEUTROABS 7.1  --   --   --   HGB 13.7  < > 12.2* 11.3*  HCT 42.8  < > 38.1* 36.0*  MCV 85.4  < > 84.7 85.3  PLT 272  < > 205 218  < > = values in this interval not displayed.  Basic Metabolic Panel:   Recent Labs Lab 02/05/16 0250 02/06/16 0436  NA 135 134*  K 4.6 4.3  CL 105 104  CO2 21* 23  GLUCOSE 166* 184*  BUN 25* 22*  CREATININE 2.22* 1.93*  CALCIUM 8.3* 8.5*    Lipid Panel:     Component Value Date/Time   CHOL 153 02/04/2016 0742   TRIG 174 (H) 02/04/2016 0742   HDL 40 (L) 02/04/2016 0742   CHOLHDL 3.8 02/04/2016 0742   VLDL 35 02/04/2016 0742   LDLCALC 78 02/04/2016 0742   HgbA1c:  Lab Results  Component Value Date   HGBA1C 7.6 (H) 02/05/2016   Urine Drug Screen:     Component Value Date/Time   LABOPIA POSITIVE (A) 02/04/2016 0530   COCAINSCRNUR NONE DETECTED 02/04/2016 0530   LABBENZ NONE DETECTED 02/04/2016 0530   AMPHETMU NONE DETECTED 02/04/2016 0530   THCU POSITIVE (A) 02/04/2016 0530   LABBARB NONE DETECTED 02/04/2016 0530      IMAGING I have personally reviewed the radiological images below and agree with the radiology interpretations.  Ct Head Wo Contrast 02/04/2016 Evolving 11 x  30 mm RIGHT caudal thalamic hematoma resulting in mild obstructive hydrocephalus. Severe white matter changes most compatible with chronic hypertensive encephalopathy, recommend MRI of the head on nonemergent basis for further characterization.   02/03/2016 Interval development of intraparenchymal hemorrhage seen in the right thalamus with surrounding white matter edema. Mild dilatation of lateral ventricles is noted.   Mri and Mra Brain Wo Contrast 02/05/2016 IMPRESSION: MRI HEAD: Evolving RIGHT thalamic hematoma. Effaced 3rd ventricle with mild acute hydrocephalus. Bilateral sub centimeter acute watershed territory infarcts. Moderate to severe white matter changes consistent with interstitial edema and chronic small vessel ischemic disease. MRA HEAD: No acute vascular process. Mildly ectatic basilar artery.   2D echo  Left ventricle: The cavity size was normal. Wall thickness was   increased in a pattern of moderate LVH. Systolic function was   normal. The estimated ejection fraction was in the range of 55%   to 60%. Wall motion was normal; there were no regional wall   motion abnormalities. Doppler parameters are consistent with   restrictive physiology, indicative of decreased left ventricular   diastolic compliance and/or increased left atrial pressure. Impressions: - Extremely limited; definity used; normal LV systolic function;   moderate LVH; restrictive filling.  CUS - Bilateral 1-39%  ICA stenosis, antegrade vertebral flow. Difficult exam due to patient cooperation, snoring and body habitus.  Ct Head Wo Contrast 02/06/2016 IMPRESSION: Evolving RIGHT thalamic hemorrhage. Unchanged mild obstructive hydrocephalus. Chronic changes include old LEFT thalamus lacunar infarct and moderate to severe chronic small vessel ischemic disease better characterized on recent MRI.   [name] 02/06/2016 IMPRESSION: 1. Bilateral acute cerebral watershed infarcts, progressed from the recent  MRI. 2. Punctate acute infarct in the right superior cerebellar peduncle. 3. Unchanged right thalamic hemorrhage with surrounding edema and mild obstructive hydrocephalus. 4. Extensive chronic small vessel ischemic disease.     PHYSICAL EXAM  Temp:  [97.8 F (36.6 C)-101 F (38.3 C)] 99.8 F (37.7 C) (02/01 1600) Pulse Rate:  [64-85] 78 (02/01 1600) Resp:  [12-24] 13 (02/01 1600) BP: (113-199)/(60-185) 123/72 (02/01 1600) SpO2:  [96 %-100 %] 99 % (02/01 1600)  General - Well nourished, well developed, in no apparent distress, Drowsy sleepy.  Ophthalmologic - Fundi not visualized due to noncooperation  Cardiovascular - Regular rate and rhythm.  Respiratory - snorers breathing but able to protect airway  neuro - drowsy sleepy, and briefly open eyes on command. Barely able to make sounds, not able to have speech output. PERRL, eyes middle position, positive corneal, left facial droop. On pain stimulation RUE and RLE purposeful movement, but LUE and LLE mild withdrawal. DTR 1+ and no Babinski. Sensation, coordination and gait not tested    ASSESSMENT/PLAN Mr. KAIEL WEIDE is a 46 y.o. male with history of hypertension and hyperlipidemia presenting with headache and left-sided weakness. CT showed a right thalamic intracerebral hemorrhage in setting of elevated blood pressure. He was started on IV Cardene admitted to the neuro ICU.  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 Bilateral watershed infarcts may be due to strict BP control and hypoxia/hypercapnia from OSA.  Resultant  left hemiparesis and left facial droop, AMS  CT head x 2 evolving right thalamic ICH with mild obstructive hydrocephalus.   MRI - evolving RIGHT thalamic hematoma. Mild acute hydrocephalus. Bilateral sub centimeter acute watershed territory infarcts.   MRA  unremarkable   MR repeat - increase bilateral watershed territory infarcts.  Carotid Doppler  unremarkable   2D Echo   EF 55-60%   LDL 78  HgbA1c 7.9  SCDs for VTE prophylaxis Diet NPO time specified  aspirin 81 mg daily prior to admission, now on No antithrombotic secondary to hemorrhage  Ongoing aggressive stroke risk factor management  Therapy recommendations:  Pending  Disposition:  Pending  Bilateral watershed territory infarcts  May explain mental status changes  May be related to strict BP control and hypoxia/hypercarbia from OSA  Currently BP goal < 140, will relax to < 160  Will try CPAP  TCD in a.m. to evaluate vasospasm  Hypertensive emergency  Blood pressure 211/115 on arrival in setting of neurologic symptoms   Started on Cardene in the ED for control, changed to Cleviprex  off labetalol drip  SBP goal < 140, will relax to < 160 due to bilateral watershed infarcts  on po meds with labetalol, amlodipine and hydralazine  Taper off Cleviprex as able  Hyperlipidemia  Home meds:  No statin  LDL 78  Add Lipitor 10  Continue statin on discharge  Diabetes  Hyperglycemia   Check CBGs every 4 hours - currently NPO  SSI  HgbA1c 7.9 , goal < 7.0  Uncontrolled  On Lantus Lantus 15 units daily at bedtime  CKD, stage III  Creatinine  2.14->2.22-> 1.93  Likely due to uncontrolled HTN and DM  On IV fluid  Other Stroke Risk Factors  Marijuana user, 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 This patient's care requires constant monitoring of vital signs, hemodynamics, respiratory and  cardiac monitoring, review of multiple databases, neurological assessment, discussion with family, other specialists and medical decision making of high complexity. I spent 45 minutes of neurocritical care time in the care of this patient.  Rosalin Hawking, MD PhD Stroke Neurology 02/06/2016 5:55 PM   To contact Stroke Continuity provider, please refer to [email or web address] After hours, contact General Neurology

## 2016-02-06 NOTE — Progress Notes (Signed)
Cortrak tube inserted in pts right nare to 98 cm. Pt toleated fairly well. Xray has been ordered and tube has been added to assessment. Please keep cortrak tube if it becomes dislodged, it may be reinserted. Please contact Cortrak team with any questions or concerns.

## 2016-02-06 NOTE — Progress Notes (Signed)
Occupational Therapy Treatment Patient Details Name: Adam Vincent MRN: [id number] DOB: 11-28-1970 Today's Date: 02/06/2016    History of present illness This 46 y.o. male admitted with generalized weakness and cough. Head CT-Evolving RIGHT thalamic hemorrhage. MRI-Evolving RIGHT thalamic hematoma. Effaced 3rd ventricle with mild acute hydrocephalus. Bilateral sub centimeter acute watershed territory infarcts. PMH includes:  CAD with multiple stents, COPD on 2L 02, HTN, CKDIII, parosysmal A-fib, h/o CVA, syncope, PNA, h/o substance abuse, conversion, anxiety disorder, cognitive impairment.    OT comments  Pt seen with PT.  Pt minimally responsive today.  He was moved to EOB sitting with total A +2 and sat EOB with max A +2.  He will open eyes occasionally, and now demonstrates Rt gaze preference.  He was able to track to midline x 1 with max stimuli.  He followed no commands, and made no attempts and verbalization - made grunting noises throughout session.  He does withdrawal to pain all 4 extrems, but no active movement noted on Lt.   RN aware.   Follow Up Recommendations  CIR;Supervision/Assistance - 24 hour    Equipment Recommendations  Tub/shower bench;3 in 1 bedside commode    Recommendations for Other Services Rehab consult    Precautions / Restrictions Precautions Precautions: Fall Precaution Comments: left hemiplegia Restrictions Weight Bearing Restrictions: No       Mobility Bed Mobility Overal bed mobility: Needs Assistance Bed Mobility: Rolling;Supine to Sit;Sit to Supine Rolling: Total assist;+2 for physical assistance   Supine to sit: Total assist;+2 for physical assistance;+2 for safety/equipment;HOB elevated Sit to supine: Total assist;+2 for physical assistance;HOB elevated;+2 for safety/equipment   General bed mobility comments: Total A to bring LEs to EOB and elevate trunk; assist to bring LEs into bed and lower trunk to return to supine. Rolling to right with  assist of 2.  Transfers Overall transfer level:  (NA secondary to poor sitting balance/trunk control.)               General transfer comment: unable to safely attempt     Balance Overall balance assessment: Needs assistance Sitting-balance support: Feet supported;Single extremity supported Sitting balance-Leahy Scale: Zero Sitting balance - Comments: Requires Max A for static sitting balance with 1 UE support. Sat EOB ~15 mins.                           ADL Overall ADL's : Needs assistance/impaired                                       General ADL Comments: Pt unable to engage in ADL activity.  He requires total A for ADLs       Vision                     Perception     Praxis      Cognition   Behavior During Therapy: Flat affect Overall Cognitive Status: Impaired/Different from baseline     Current Attention Level: Focused    Following Commands:  (not following commands)   Awareness: Intellectual   General Comments: Eyes open with right gaze during session. Able to get to midline x1 with max cues and once to left with painful stimuli. No verbalizations. Sticking tongue out almost in rooting like pattern.  Difficulty managing secretions.     Extremity/Trunk Assessment  Upper Extremity Assessment LUE  Deficits / Details: No movement in LUE today.    Lower Extremity Assessment Lower Extremity Assessment: LLE deficits/detail;RLE deficits/detail RLE Deficits / Details: No purposeful movement RLE except activation of hamstrings noted to bring leg back towards rail. Grimaces to noxious stimuli LLE Deficits / Details: No purposeful movement LLE; some activation of hamstrings noted with bringing leg back towards rail. Responds to noxious stimuli with grimaces.    Cervical / Trunk Assessment Cervical / Trunk Assessment: Other exceptions Cervical / Trunk Exceptions: decreased trunk control     Exercises     Shoulder  Instructions       General Comments      Pertinent Vitals/ Pain       Pain Assessment: Faces Faces Pain Scale: No hurt Pain Location: grimaces during pinches Pain Intervention(s): Limited activity within patient's tolerance;Repositioned;Monitored during session  Home Living Family/patient expects to be discharged to:: Private residence Living Arrangements: Spouse/significant other;Other relatives Available Help at Discharge: Family;Available 24 hours/day Type of Home: House Home Access: Stairs to enter [employer] of Steps: 3 Entrance Stairs-Rails: Right Home Layout: One level     Bathroom Shower/Tub: Tub/shower unit;Curtain         Home Equipment: None   Additional Comments: Pt's significant other works nights, but may switch to days.  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     Frequency  Min 3X/week        Progress Toward Goals  OT Goals(current goals can now be found in the care plan section)  Progress towards OT goals: Not progressing toward goals - comment (decreased arousal )  Acute Rehab OT Goals Patient Stated Goal: none stated as pt unable to at this time.  Plan Discharge plan remains appropriate (depending on progress )    Co-evaluation    PT/OT/SLP Co-Evaluation/Treatment: Yes Reason for Co-Treatment: Complexity of the patient's impairments (multi-system involvement);For patient/therapist safety;To address functional/ADL transfers   OT goals addressed during session: Strengthening/ROM      End of Session     Activity Tolerance Patient limited by lethargy   Patient Left in bed;with call bell/phone within reach;with nursing/sitter in room   Nurse Communication Mobility status        Time: 0223-3612 OT Time Calculation (min): 31 min  Charges: OT General Charges $OT Visit:  1 Procedure OT Treatments $Therapeutic Activity: 8-22 mins  Catarina Huntley M 02/06/2016, 4:36 PM

## 2016-02-06 NOTE — Progress Notes (Signed)
Initial Nutrition Assessment  DOCUMENTATION CODES:   Morbid obesity  INTERVENTION:   If pt remains NPO recommend: Jevity 1.2 @ 75 ml/hr (1800 ml/day) 30 ml Prostat BID Provides: 2360 kcal, 130 grams protein, and 1459 H2O.   NUTRITION DIAGNOSIS:   Inadequate oral intake related to inability to eat as evidenced by NPO status.  GOAL:   Patient will meet greater than or equal to 90% of their needs  MONITOR:   Diet advancement, I & O's  REASON FOR ASSESSMENT:   NPO/Clear Liquid Diet    ASSESSMENT:   Pt with PMH of hypertension and hyperlipidemia presenting with headache and left-sided weakness. CT showed a right thalamic intracerebral hemorrhage in setting of elevated blood pressure  Spoke with RN. Pt with neuro status change, global aphagia. Cortrak tube inserted for medication administration.  No family present during assessment.  Nutrition-Focused physical exam completed. Findings are no fat depletion, no muscle depletion, and no edema.    Diet Order:  DIET DYS 3 Room service appropriate? Yes; Fluid consistency: Thin  Skin:  Reviewed, no issues  Last BM:  unknown  Height:   Ht Readings from Last 1 Encounters:  02/03/16 6\' 1"  (1.854 m)    Weight:   Wt Readings from Last 1 Encounters:  02/05/16 (!) 380 lb 11.8 oz (172.7 kg)    Ideal Body Weight:  83.6 kg  BMI:  Body mass index is 50.23 kg/m.  Estimated Nutritional Needs:   Kcal:  2200-2400  Protein:  115-130 grams  Fluid:  >2.2 L/day  EDUCATION NEEDS:   No education needs identified at this time  [place], [place], [place] Pager [phone] After Hours Pager

## 2016-02-07 ENCOUNTER — Inpatient Hospital Stay (HOSPITAL_COMMUNITY): Payer: Medicaid Other

## 2016-02-07 DIAGNOSIS — I618 Other nontraumatic intracerebral hemorrhage: Principal | ICD-10-CM

## 2016-02-07 DIAGNOSIS — G4733 Obstructive sleep apnea (adult) (pediatric): Secondary | ICD-10-CM

## 2016-02-07 DIAGNOSIS — D72829 Elevated white blood cell count, unspecified: Secondary | ICD-10-CM

## 2016-02-07 DIAGNOSIS — I639 Cerebral infarction, unspecified: Secondary | ICD-10-CM

## 2016-02-07 DIAGNOSIS — Z9989 Dependence on other enabling machines and devices: Secondary | ICD-10-CM

## 2016-02-07 LAB — CBC
HCT: 36.5 % — ABNORMAL LOW (ref 39.0–52.0)
HEMOGLOBIN: 11.7 g/dL — AB (ref 13.0–17.0)
MCH: 27 pg (ref 26.0–34.0)
MCHC: 32.1 g/dL (ref 30.0–36.0)
MCV: 84.1 fL (ref 78.0–100.0)
Platelets: 252 10*3/uL (ref 150–400)
RBC: 4.34 MIL/uL (ref 4.22–5.81)
RDW: 15.5 % (ref 11.5–15.5)
WBC: 11.3 10*3/uL — ABNORMAL HIGH (ref 4.0–10.5)

## 2016-02-07 LAB — BASIC METABOLIC PANEL
ANION GAP: 11 (ref 5–15)
BUN: 23 mg/dL — AB (ref 6–20)
CHLORIDE: 101 mmol/L (ref 101–111)
CO2: 21 mmol/L — ABNORMAL LOW (ref 22–32)
Calcium: 8.7 mg/dL — ABNORMAL LOW (ref 8.9–10.3)
Creatinine, Ser: 1.85 mg/dL — ABNORMAL HIGH (ref 0.61–1.24)
GFR calc Af Amer: 50 mL/min — ABNORMAL LOW (ref >60)
GFR calc non Af Amer: 43 mL/min — ABNORMAL LOW (ref >60)
GLUCOSE: 173 mg/dL — AB (ref 65–99)
POTASSIUM: 4.3 mmol/L (ref 3.5–5.1)
SODIUM: 133 mmol/L — AB (ref 135–145)

## 2016-02-07 LAB — HEMOGLOBIN A1C
HEMOGLOBIN A1C: 7.8 % — AB (ref 4.8–5.6)
Mean Plasma Glucose: 177 mg/dL

## 2016-02-07 LAB — GLUCOSE, CAPILLARY
GLUCOSE-CAPILLARY: 165 mg/dL — AB (ref 65–99)
Glucose-Capillary: 164 mg/dL — ABNORMAL HIGH (ref 65–99)
Glucose-Capillary: 151 mg/dL — ABNORMAL HIGH (ref 65–99)
Glucose-Capillary: 174 mg/dL — ABNORMAL HIGH (ref 65–99)
Glucose-Capillary: 196 mg/dL — ABNORMAL HIGH (ref 65–99)

## 2016-02-07 MED ORDER — INSULIN GLARGINE 100 UNIT/ML ~~LOC~~ SOLN
20.0000 [IU] | Freq: Every day | SUBCUTANEOUS | Status: DC
  Administered 2016-02-08 – 2016-02-14 (×7): 20 [IU] via SUBCUTANEOUS
  Filled 2016-02-07 (×8): qty 0.2

## 2016-02-07 MED ORDER — ORAL CARE MOUTH RINSE
15.0000 mL | Freq: Two times a day (BID) | OROMUCOSAL | Status: DC
  Administered 2016-02-07 – 2016-02-24 (×28): 15 mL via OROMUCOSAL

## 2016-02-07 MED ORDER — JEVITY 1.5 CAL/FIBER PO LIQD
1000.0000 mL | ORAL | Status: DC
  Administered 2016-02-07 – 2016-02-17 (×11): 1000 mL
  Filled 2016-02-07 (×34): qty 1000

## 2016-02-07 MED ORDER — MAGIC MOUTHWASH
10.0000 mL | Freq: Three times a day (TID) | ORAL | Status: DC
  Administered 2016-02-07 – 2016-02-15 (×22): 10 mL via ORAL
  Filled 2016-02-07 (×26): qty 10

## 2016-02-07 MED ORDER — JEVITY 1.2 CAL PO LIQD: 1000.0000 mL | ORAL | Status: DC

## 2016-02-07 MED ORDER — CHLORHEXIDINE GLUCONATE 0.12 % MT SOLN
15.0000 mL | Freq: Two times a day (BID) | OROMUCOSAL | Status: DC
  Administered 2016-02-07 – 2016-02-25 (×35): 15 mL via OROMUCOSAL
  Filled 2016-02-07 (×33): qty 15

## 2016-02-07 NOTE — Progress Notes (Signed)
Initial Nutrition Assessment  DOCUMENTATION CODES:   Morbid obesity  INTERVENTION:    Jevity 1.5 at 105 ml/h x 12 hours per day (8am-8pm) -- 1260 ml per day  30 ml Prostat TID  Provides: 2190 kcal, 125 gm protein, 958 ml free water daily.   NUTRITION DIAGNOSIS:   Inadequate oral intake related to inability to eat as evidenced by NPO status. Ongoing   GOAL:   Patient will meet greater than or equal to 90% of their needs  Unmet  MONITOR:   TF tolerance, I & O's, Diet advancement  ASSESSMENT:   Pt with PMH of hypertension and hyperlipidemia presenting with headache and left-sided weakness. CT showed a right thalamic intracerebral hemorrhage in setting of elevated blood pressure  Pt with continued decreased alertness, unable to take PO's.  Spoke with RN. RD to order TF. Patient can only receive TF when CPAP is off. CPAP planned to be on all night from ~8pm to 8am and may be needed for short times during the day. Will order TF to run 12 hours from 8am to 8pm. Cortrak tube in place, tip in the fourth portion of the duodenum. Labs and medications reviewed.  Diet Order:  Diet NPO time specified  Skin:  Reviewed, no issues  Last BM:  unknown  Height:   Ht Readings from Last 1 Encounters:  02/03/16 6\' 1"  (1.854 m)    Weight:   Wt Readings from Last 1 Encounters:  02/05/16 (!) 380 lb 11.8 oz (172.7 kg)    Ideal Body Weight:  83.6 kg  BMI:  Body mass index is 50.23 kg/m.  Estimated Nutritional Needs:   Kcal:  2200-2400  Protein:  115-130 grams  Fluid:  >2.2 L/day  EDUCATION NEEDS:   No education needs identified at this time  Molli Barrows, [place], [place], [place] Pager [phone] After Hours Pager [phone]

## 2016-02-07 NOTE — Progress Notes (Signed)
STROKE TEAM PROGRESS NOTE   SUBJECTIVE (INTERVAL HISTORY) Ex-wife at bedside. Continue to have snoring sound and continue to have decreased mentation, however, able to protect airway. On BiPAP last night and breathing better. During the day, BiPAP is off, however, will ask CCM to consider BiPAP during the day too.    OBJECTIVE Temp:  [98.2 F (36.8 C)-100 F (37.8 C)] 99.3 F (37.4 C) (02/02 2000) Pulse Rate:  [77-106] 106 (02/02 2000) Resp:  [14-28] 19 (02/02 2000) BP: (108-175)/(58-121) 175/86 (02/02 2000) SpO2:  [96 %-100 %] 100 % (02/02 2000) Weight:  [388 lb (176 kg)] 388 lb (176 kg) (02/02 2000)  CBC:  Recent Labs Lab 02/03/16 1542  02/06/16 0436 02/07/16 0634  WBC 9.7  < > 9.6 11.3*  NEUTROABS 7.1  --   --   --   HGB 13.7  < > 11.3* 11.7*  HCT 42.8  < > 36.0* 36.5*  MCV 85.4  < > 85.3 84.1  PLT 272  < > 218 252  < > = values in this interval not displayed.  Basic Metabolic Panel:   Recent Labs Lab 02/06/16 0436 02/07/16 0634  NA 134* 133*  K 4.3 4.3  CL 104 101  CO2 23 21*  GLUCOSE 184* 173*  BUN 22* 23*  CREATININE 1.93* 1.85*  CALCIUM 8.5* 8.7*    Lipid Panel:     Component Value Date/Time   CHOL 153 02/04/2016 0742   TRIG 174 (H) 02/04/2016 0742   HDL 40 (L) 02/04/2016 0742   CHOLHDL 3.8 02/04/2016 0742   VLDL 35 02/04/2016 0742   LDLCALC 78 02/04/2016 0742   HgbA1c:  Lab Results  Component Value Date   HGBA1C 7.8 (H) 02/06/2016   Urine Drug Screen:     Component Value Date/Time   LABOPIA POSITIVE (A) 02/04/2016 0530   COCAINSCRNUR NONE DETECTED 02/04/2016 0530   LABBENZ NONE DETECTED 02/04/2016 0530   AMPHETMU NONE DETECTED 02/04/2016 0530   THCU POSITIVE (A) 02/04/2016 0530   LABBARB NONE DETECTED 02/04/2016 0530      IMAGING I have personally reviewed the radiological images below and agree with the radiology interpretations.  Ct Head Wo Contrast 02/04/2016 Evolving 11 x 30 mm RIGHT caudal thalamic hematoma resulting in mild  obstructive hydrocephalus. Severe white matter changes most compatible with chronic hypertensive encephalopathy, recommend MRI of the head on nonemergent basis for further characterization.   02/03/2016 Interval development of intraparenchymal hemorrhage seen in the right thalamus with surrounding white matter edema. Mild dilatation of lateral ventricles is noted.   Mri and Mra Brain Wo Contrast 02/05/2016 IMPRESSION: MRI HEAD: Evolving RIGHT thalamic hematoma. Effaced 3rd ventricle with mild acute hydrocephalus. Bilateral sub centimeter acute watershed territory infarcts. Moderate to severe white matter changes consistent with interstitial edema and chronic small vessel ischemic disease. MRA HEAD: No acute vascular process. Mildly ectatic basilar artery.   2D echo  Left ventricle: The cavity size was normal. Wall thickness was   increased in a pattern of moderate LVH. Systolic function was   normal. The estimated ejection fraction was in the range of 55%   to 60%. Wall motion was normal; there were no regional wall   motion abnormalities. Doppler parameters are consistent with   restrictive physiology, indicative of decreased left ventricular   diastolic compliance and/or increased left atrial pressure. Impressions: - Extremely limited; definity used; normal LV systolic function;   moderate LVH; restrictive filling.  CUS - Bilateral 1-39% ICA stenosis, antegrade vertebral flow. Difficult  exam due to patient cooperation, snoring and body habitus.  Ct Head Wo Contrast 02/06/2016 IMPRESSION: Evolving RIGHT thalamic hemorrhage. Unchanged mild obstructive hydrocephalus. Chronic changes include old LEFT thalamus lacunar infarct and moderate to severe chronic small vessel ischemic disease better characterized on recent MRI.   [name] 02/06/2016 IMPRESSION: 1. Bilateral acute cerebral watershed infarcts, progressed from the recent MRI. 2. Punctate acute infarct in the right superior  cerebellar peduncle. 3. Unchanged right thalamic hemorrhage with surrounding edema and mild obstructive hydrocephalus. 4. Extensive chronic small vessel ischemic disease.    LE venous doppler - No evidence of deep vein thrombosis or baker's cysts bilaterally.  TCD - no vasospasm   PHYSICAL EXAM  Temp:  [98.2 F (36.8 C)-100 F (37.8 C)] 99.3 F (37.4 C) (02/02 2000) Pulse Rate:  [77-106] 106 (02/02 2000) Resp:  [14-28] 19 (02/02 2000) BP: (108-175)/(58-121) 175/86 (02/02 2000) SpO2:  [96 %-100 %] 100 % (02/02 2000) Weight:  [388 lb (176 kg)] 388 lb (176 kg) (02/02 2000)  General - Well nourished, well developed, in no apparent distress, Drowsy sleepy.  Ophthalmologic - Fundi not visualized due to noncooperation  Cardiovascular - Regular rate and rhythm.  Respiratory - snoring breathing but able to protect airway  neuro - drowsy sleepy, and briefly open eyes on command. Barely able to make sounds, not able to have speech output. PERRL, eyes middle position, positive corneal, left facial droop. On pain stimulation RUE and RLE purposeful movement, but LUE and LLE mild withdrawal. DTR 1+ and no Babinski. Sensation, coordination and gait not tested    ASSESSMENT/PLAN [name] is a 46 y.o. male with history of hypertension and hyperlipidemia presenting with headache and left-sided weakness. CT showed a right thalamic intracerebral hemorrhage in setting of elevated blood pressure. He was started on IV Cardene admitted to the neuro ICU.  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   Resultant  left hemiparesis and left facial droop, AMS  CT head x 2 evolving right thalamic ICH with mild obstructive hydrocephalus.   MRI - evolving RIGHT thalamic hematoma. Mild acute hydrocephalus. Bilateral sub centimeter acute watershed territory infarcts.   MRA  unremarkable   MR repeat - increase bilateral watershed territory infarcts.  Carotid Doppler   unremarkable   2D Echo  EF 55-60%   LE venous doppler - no DVT  LDL 78  HgbA1c 7.9  SCDs for VTE prophylaxis Diet NPO time specified  aspirin 81 mg daily prior to admission, now on No antithrombotic secondary to hemorrhage  Ongoing aggressive stroke risk factor management  Therapy recommendations:  Pending  Disposition:  Pending  Bilateral watershed territory infarcts - may be due to strict BP control and hypoxia/hypercapnia from OSA.  May explain mental status changes  May be related to strict BP control and hypoxia/hypercarbia from OSA  Currently BP goal < 140, will relax to < 160  On CPAP at night, will see if CCM agrees to put CPAP during the day.   TCD no sign of vasospasm  Hypertensive emergency  Blood pressure 211/115 on arrival in setting of neurologic symptoms   Started on Cardene in the ED for control, changed to Cleviprex  off labetalol drip  SBP goal < 140, will relax to < 160 due to bilateral watershed infarcts  on po meds with labetalol, amlodipine and hydralazine  Taper off Cleviprex as able  Hyperlipidemia  Home meds:  No statin  LDL 78  Add Lipitor 10  Continue statin on discharge  Diabetes  Hyperglycemia   Check CBGs every 4 hours - currently NPO  SSI  HgbA1c 7.9 , goal < 7.0  Uncontrolled  increase Lantus 20 units daily at bedtime  Dysphagia   NG tube feeding  105 cc/h during the day and off at night for CPAP  CKD, stage III  Creatinine 2.14->2.22-> 1.93->1.85  Likely due to uncontrolled HTN and DM  On IV fluid  Other Stroke Risk Factors  Marijuana user, 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   Morbid Obesity, Body mass index is 51.19 kg/m., recommend weight loss, diet and exercise as appropriate   OSA - CCM on board for CPAP  Other Active Problems  Leukocytosis 12.1 -> 11.6-> 9.6->11.3  Right cheek swelling - intact oral mucosa - concerning for dental abscess - put on magic mouth wash  Hospital day # 4  This patient is  critically ill due to [place] with mild hydrocephalus, bilateral watershed infarcts, hypertensive emergency, uncontrolled diabetes and at significant risk of neurological worsening, death form hematoma extension, obstructive hydrocephalus, infarct extension, heart failure, DKA, and brain death. This patient's care requires constant monitoring of vital signs, hemodynamics, respiratory and cardiac monitoring, review of multiple databases, neurological assessment, discussion with family, other specialists and medical decision making of high complexity. I spent 40 minutes of neurocritical care time in the care of this patient.  Rosalin Hawking, MD PhD Stroke Neurology 02/07/2016 10:14 PM   To contact Stroke Continuity provider, please refer to [email or web address] After hours, contact General Neurology

## 2016-02-07 NOTE — Progress Notes (Signed)
Pt with bilateral watershed infarcts and increasing lethargy.  Plan tube feeding during the daytime with nighttime CPAP.  CIR to consult when pt more alert and able to participate with therapies.    Reinaldo Raddle, RN, BSN  Trauma/Neuro ICU Case Manager [phone]

## 2016-02-07 NOTE — Progress Notes (Signed)
PT Cancellation Note  Patient Details Name: Adam Vincent MRN: [id number] DOB: [birth year]   Cancelled Treatment:    Reason Eval/Treat Not Completed: Fatigue/lethargy limiting ability to participate Pt still somnolent and sometimes awakens with sternal rub but otherwise not responsive. Will hold PT and follow up as appropriate.   Marguarite Arbour A Canna Nickelson 02/07/2016, 11:27 AM Wray Kearns, PT, DPT [phone]

## 2016-02-07 NOTE — Progress Notes (Signed)
Patient placed on CPAP without complications. Tube Feeding stopped. RT will continue to monitor as needed.

## 2016-02-07 NOTE — Progress Notes (Signed)
SLP Cancellation Note  Patient Details Name: Adam Vincent MRN: [id number] DOB: 12-10-1970   Cancelled treatment:       Reason Eval/Treat Not Completed: Fatigue/lethargy limiting ability to participate. RN reports no improvement in alertness today. Will continue to hold for today.   Germain Osgood 02/07/2016, 11:29 AM  Germain Osgood, M.A. CCC-SLP [phone]

## 2016-02-07 NOTE — Consult Note (Addendum)
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 right handed male with history of morbid obesity, hypertension diabetes mellitus as well as marijuana use. Per chart review and patient's wife, patient lives with spouse. Independent prior to admission. 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 Reported to have supportive family. Presented 02/03/2016 with sudden onset of headache and left-sided weakness. Blood pressure 220/130. Creatinine 1.87-2.14. Started on Cardene in the ED for blood pressure control. CT of the head showed interval development of intraparenchymal hemorrhage seen in the right thalamus with surrounding white matter edema. Urine drug screen positive for marijuana. MRI reviewed, showing right thalamic hematoma. Effaced third ventricle. Per reports, mild acute hydrocephalus. Bilateral subcentimeter acute watershed territory infarct. MRA with no acute vascular process. Echocardiogram with ejection fraction of 60% no wall motion abnormalities. Neurology consulted with workup ongoing placed on subcutaneous heparin for DVT prophylaxis 02/06/2016. Presently NPO with planned follow-up swallow study. Hemoglobin A1c 7.9 with insulin therapy as directed. Close monitoring of blood pressure maintained on Cleviprex. Physical therapy evaluation completed 02/06/2016 with recommendations of physical medicine rehabilitation consult. Overall therapies have been limited due to patient's lethargy. EEG completed 02/10/2016 no seizure activity noted findings consistent with hypoxic ischemic injury, toxic metabolic encephalopathy. Latest MRI of the brain 02/06/2016 showing bilateral acute cerebral watershed infarct progressed from most recent MRI. Full cranial CT scan 02/11/2016 pending. Neurology  services continues to recommend conservative care. Per wife, pt able to open eyes yesterday and stick out tongue to command.  Per nursing, patient able to do the same after several commands, but unable to follow any other commands and not tracking.  Briefly opens eyes.  Neurology started Provigil yesterday, no improvement as of yet.    Review of Systems  Unable to perform ROS: Acuity of condition   Past Medical History:  Diagnosis Date  . Diabetes mellitus   . Hypertension    Past Surgical History:  Procedure Laterality Date  . ABSCESS DRAINAGE     Left leg   History reviewed. No pertinent family history., unable to obtain from patient.  Social History:  reports that he has never smoked. He has never used smokeless tobacco. He reports that he uses drugs, including Marijuana. He reports that he does not drink alcohol. Allergies: No Known Allergies Medications Prior to Admission  Medication Sig Dispense Refill  . aspirin 81 MG chewable tablet Chew 81 mg by mouth daily.    Marland Kitchen ibuprofen (ADVIL,MOTRIN) 100 MG tablet Take 200 mg by mouth every 6 (six) hours as needed for pain.    . hydrochlorothiazide (HYDRODIURIL) 25 MG tablet Take 1 tablet (25 mg total) by mouth daily. 30 tablet 0  . metFORMIN (GLUCOPHAGE) 500 MG tablet Take 1,000 mg by mouth daily with breakfast.      Home: Home Living Family/patient expects to be discharged to:: Private residence Living Arrangements: Spouse/significant other, Other relatives Available Help at Discharge: Family, Available 24 hours/day Type of Home: House Home Access: Stairs to enter [employer] of Steps: 3 Entrance Stairs-Rails: Right Home Layout: One level Bathroom Shower/Tub: Tub/shower unit, Curtain Home Equipment: None Additional Comments: Pt's significant other works nights, but may switch to days.  Other family members will provide assist while she works   Functional History: Prior Function Level of Independence:  Independent Comments: Pt worked detailing cars with  his brother.   Functional Status:  Mobility: Bed Mobility Overal bed mobility: Needs Assistance Bed Mobility: Rolling, Supine to Sit, Sit to Supine Rolling: Total assist, +2 for physical assistance Supine to sit: Total assist, +2 for physical assistance, +2 for safety/equipment, HOB elevated Sit to supine: Total assist, +2 for physical assistance, HOB elevated, +2 for safety/equipment General bed mobility comments: Total A to bring LEs to EOB and elevate trunk; assist to bring LEs into bed and lower trunk to return to supine. Rolling to right with assist of 2. Transfers Overall transfer level:  (NA secondary to poor sitting balance/trunk control.) Transfers: Sit to/from Stand, Stand Pivot Transfers Sit to Stand: Mod assist, +2 physical assistance Stand pivot transfers: Mod assist, +2 physical assistance General transfer comment: unable to safely attempt       ADL: ADL Overall ADL's : Needs assistance/impaired Eating/Feeding: Maximal assistance, Sitting Grooming: Wash/dry hands, Wash/dry face, Oral care, Brushing hair, Maximal assistance, Sitting Grooming Details (indicate cue type and reason): max cues to initiate activity and assist to complete task  Upper Body Bathing: Total assistance, Bed level Lower Body Bathing: Total assistance, Bed level Upper Body Dressing : Total assistance, Bed level Lower Body Dressing: Total assistance, Bed level Toilet Transfer: Moderate assistance, +2 for physical assistance, Stand-pivot, BSC Toilet Transfer Details (indicate cue type and reason): Step by step verbal cues. Assist to move into standing and assist for balance and to prevent Lt knee buckling  Toileting- Clothing Manipulation and Hygiene: Total assistance, Sit to/from stand Functional mobility during ADLs: Moderate assistance, +2 for physical assistance General ADL Comments: Pt unable to engage in ADL activity.  He requires total A for  ADLs   Cognition: Cognition Overall Cognitive Status: Impaired/Different from baseline Orientation Level: Disoriented X4 Cognition Arousal/Alertness: Lethargic Behavior During Therapy: Flat affect Overall Cognitive Status: Impaired/Different from baseline Area of Impairment: Orientation, Attention, Memory, Following commands, Safety/judgement, Awareness, Problem solving Orientation Level: Disoriented to, Time Current Attention Level: Focused Memory: Decreased short-term memory Following Commands:  (not following commands) Safety/Judgement: Decreased awareness of safety, Decreased awareness of deficits Awareness: Intellectual Problem Solving: Slow processing, Decreased initiation, Difficulty sequencing, Requires verbal cues, Requires tactile cues General Comments: Eyes open with right gaze during session. Able to get to midline x1 with max cues and once to left with painful stimuli. No verbalizations. Sticking tongue out almost in rooting like pattern.  Difficulty managing secretions.   Blood pressure (!) 161/82, pulse 79, temperature 98.4 F (36.9 C), temperature source Axillary, resp. rate 15, height 6\' 1"  (1.854 m), weight (!) 172.7 kg (380 lb 11.8 oz), SpO2 99 %. Physical Exam  Vitals reviewed. Constitutional: He appears well-developed.  46 year old obese male  HENT:  Head: Normocephalic and atraumatic.  Eyes: Right eye exhibits no discharge. Left eye exhibits no discharge.  Pupils sluggish to light  Neck: Normal range of motion. Neck supple. No thyromegaly present.  Cardiovascular: Normal rate and regular rhythm.   Respiratory: Effort normal. No respiratory distress.  Weak spontaneous cough  GI: Soft. Bowel sounds are normal. He exhibits no distension.  +NG  Musculoskeletal: He exhibits no edema or tenderness (No apparent tenderness).  Neurological:  Patient lethargic and unable to arouse.  Exam remained limited due to lack of participation DTRs symmetric  Skin: Skin is  warm and dry.  Psychiatric:  Unable to assess due to lethargy    Results for orders placed or performed during the hospital encounter of 02/03/16 (from the past 24 hour(s))  Glucose, capillary  Status: Abnormal   Collection Time: 02/06/16  8:21 AM  Result Value Ref Range   Glucose-Capillary 199 (H) 65 - 99 mg/dL  Glucose, capillary     Status: Abnormal   Collection Time: 02/06/16 11:57 AM  Result Value Ref Range   Glucose-Capillary 161 (H) 65 - 99 mg/dL   Comment 1 Notify RN    Comment 2 Document in Chart   Glucose, capillary     Status: Abnormal   Collection Time: 02/06/16  3:35 PM  Result Value Ref Range   Glucose-Capillary 162 (H) 65 - 99 mg/dL  Glucose, capillary     Status: Abnormal   Collection Time: 02/06/16  8:23 PM  Result Value Ref Range   Glucose-Capillary 168 (H) 65 - 99 mg/dL  Glucose, capillary     Status: Abnormal   Collection Time: 02/06/16 11:44 PM  Result Value Ref Range   Glucose-Capillary 178 (H) 65 - 99 mg/dL  Glucose, capillary     Status: Abnormal   Collection Time: 02/07/16  3:34 AM  Result Value Ref Range   Glucose-Capillary 165 (H) 65 - 99 mg/dL   Ct Head Wo Contrast  Result Date: 02/06/2016 CLINICAL DATA:  Altered mental status. EXAM: CT HEAD WITHOUT CONTRAST TECHNIQUE: Contiguous axial images were obtained from the base of the skull through the vertex without intravenous contrast. COMPARISON:  MRI head February 04, 2016 and CT HEAD February 03, 2006 FINDINGS: BRAIN: Unchanged 3 x 1.2 cm RIGHT thalamic hematoma extending toward midbrain with mass effect on third ventricle. No intraventricular extension. Mild stable obstructive hydrocephalus with interstitial edema on a background is confluent white matter hypodensities better characterized on recent MRI. Old LEFT thalamus lacunar infarct. No acute large vascular territory infarct. No abnormal extra-axial fluid collections. Basal cisterns are patent. VASCULAR: Unremarkable. SKULL/SOFT TISSUES: No skull  fracture. No significant soft tissue swelling. ORBITS/SINUSES: The included ocular globes and orbital contents are normal.The mastoid aircells and included paranasal sinuses are well-aerated. 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 Mastoid air cells are well aerated. IMPRESSION: Evolving RIGHT thalamic hemorrhage. Unchanged mild obstructive hydrocephalus. Chronic changes include old LEFT thalamus lacunar infarct and moderate to severe chronic small vessel ischemic disease better characterized on recent MRI. Electronically Signed   By: Elon Alas [name]   On: 02/06/2016 06:36   [name]  Result Date: 02/06/2016 CLINICAL DATA:  Intra cerebral hemorrhage. EXAM: MRI HEAD WITHOUT CONTRAST TECHNIQUE: Multiplanar, multiecho pulse sequences of the brain and surrounding structures were obtained without intravenous contrast. COMPARISON:  Head CT 02/06/2016 and MRI 02/04/2016 FINDINGS: Brain: There are numerous small foci of acute infarction in the frontal and parietal lobes bilaterally, predominantly involving the centrum semiovale and corona radiata in a watershed pattern and greatly increased in number from the prior MRI. New punctate acute periventricular temporoparietal infarcts are present bilaterally, and there is a new punctate acute infarct in the right superior cerebellar peduncle. Small infarct in the left external capsule is unchanged. Foci of chronic microhemorrhage are again seen in the left cerebellum, pons, and left cerebral hemisphere. 3.2 cm right thalamic hemorrhage is unchanged in size from the prior MRI, and surrounding edema is also unchanged. Effacement of the posterior third ventricle and mild lateral ventriculomegaly are unchanged. Confluent periventricular white matter T2 hyperintensity including in the temporal lobes is unchanged and compatible with transependymal CSF flow. Patchy cerebral white matter T2 hyperintensities elsewhere are also unchanged and consistent  with moderate to severe chronic small vessel ischemic disease, greatly  abnormal for age. There may be trace hemorrhage in the fourth ventricle. No extra-axial fluid collection. Vascular: Major intracranial vascular flow voids are preserved. Skull and upper cervical spine: Unremarkable bone marrow signal. Sinuses/Orbits: Unremarkable orbits. Trace paranasal sinus mucosal thickening. Clear mastoid air cells. Other: None. IMPRESSION: 1. Bilateral acute cerebral watershed infarcts, progressed from the recent MRI. 2. Punctate acute infarct in the right superior cerebellar peduncle. 3. Unchanged right thalamic hemorrhage with surrounding edema and mild obstructive hydrocephalus. 4. Extensive chronic small vessel ischemic disease. Electronically Signed   By: Logan Bores [name]   On: 02/06/2016 17:17   Dg Abd Portable 1v  Result Date: 02/06/2016 CLINICAL DATA:  Encounter for feeding tube placement. EXAM: PORTABLE ABDOMEN - 1 VIEW COMPARISON:  CT of the abdomen 02/09/2011 FINDINGS: Feeding catheter likely in transpyloric location, with tip overlying the expected location of the fourth portion of the duodenum. The bowel gas pattern is normal. No radio-opaque calculi or other significant radiographic abnormality are seen. IMPRESSION: Feeding catheter likely in transpyloric location. Nonobstructive bowel gas pattern. Electronically Signed   By: Fidela Salisbury [name]   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  Fall precautions; pt at risk for second impact syndrome  Prevention of secondary injury: monitor for hypotension, hypoxia, seizures or signs of increased ICP  Consider pharmacological intervention if necessary with  neurostimulants, such as amantadine, methylphenidate, modafinil, etc.  Avoid medications that could impair cognitive abilities, such as anticholinergics, antihistaminic, benzodiazapines, narcotics, etc when possible Stroke: Continue secondary stroke prophylaxis and Risk Factor Modification listed below:   Antiplatelet therapy:   Blood Pressure Management:  Continue current medication with prn's with permisive HTN per primary team Statin Agent:   Diabetes management:   ?hemiparesis: fit for orthosis to prevent contractures (resting hand splint for day, wrist cock up splint at night, PRAFO, etc) Motor recovery: Fluoxetine  1. Does the need for close, 24 hr/day medical supervision in concert with the patient's rehab needs make it unreasonable for this patient to be served in a less intensive setting? Yes  2. Co-Morbidities requiring supervision/potential complications: morbid obesity (Body mass index is 50.93 kg/m., diet and exercise education, encourage weight loss to increase endurance and promote overall health), HTN (monitor and provide prns in accordance with increased physical exertion and pain), diabetes mellitus (Monitor in accordance with exercise and adjust meds as necessary), marijuana use (counsel when appropriate), Pyrexia (cont to monitor for signs and symptoms of infection, further workup if indicated), dysphagia (advance diet as tolerated), ABLA (transfuse if necessary to ensure appropriate perfusion for increased activity tolerance), CKD (avoid nephrotoxic meds) 3. Due to bladder management, bowel management, safety, skin/wound care, disease management, medication administration, pain management and patient education, does the patient require 24 hr/day rehab nursing? Yes 4. Does the patient require coordinated care of a physician, rehab nurse, PT (1-2 hrs/day, 1-2 days/week), OT (5 hrs/day, 1-2 days/week) and SLP (5 hrs/day, 5 days/week) to address physical and functional deficits in the  context of the above medical diagnosis(es)? Yes Addressing deficits in the following areas: balance, endurance, locomotion, strength, transferring, bowel/bladder control, bathing, dressing, feeding, grooming, toileting, cognition, speech, language, swallowing and psychosocial support 5. Can the patient actively participate in an intensive therapy program of at least 3 hrs of therapy per day at least 5 days per  week? No 6. The potential for patient to make measurable gains while on inpatient rehab is fair 7. Anticipated functional outcomes upon discharge from inpatient rehab are max assist and total assist  with PT, max assist and total assist with OT, max assist and total assist with SLP. 8. Estimated rehab length of stay to reach the above functional goals is: 35-40 days. 9. Does the patient have adequate social supports and living environment to accommodate these discharge functional goals? Potentially 10. Anticipated D/C setting: SNF 11. Anticipated post D/C treatments: SNF 12. Overall Rehab/Functional Prognosis: fair  RECOMMENDATIONS: This patient's condition is appropriate for continued rehabilitative care in the following setting: Pt with limitted participation with therapies and unable to tolerate 3 hours therapy/day at present.  Will cont to follow for improvement in function and cognition and consider CIR to decrease burden of care.  At this point, recommend SNF with PM&R follow up. Patient has agreed to participate in recommended program. Potentially Note that insurance prior authorization may be required for reimbursement for recommended care.  Comment: Rehab Admissions Coordinator to follow up.  Delice Lesch, MD, Mellody Drown Cathlyn Parsons., PA-C 02/07/2016

## 2016-02-07 NOTE — Consult Note (Signed)
Name: Adam Vincent MRN: [id number] DOB: 12/12/1970    ADMISSION DATE:  02/03/2016 CONSULTATION DATE:  02/07/2016  REFERRING MD :  Erlinda Hong,   CHIEF COMPLAINT:  Obstructive sleep apnea, stroke  HISTORY OF PRESENT ILLNESS:  46 year old unfortunate obese man with hypertension admitted 1/29 with sudden onset headache and found to have right thalamic bleed with surrounding edema. Blood pressure was controlled with Cardene. He initially improved somewhat and was able to get out of bed to chair but then mental status worsened again and MRI showed bilateral cerebral watershed infarcts. He was placed on clevidipine for blood pressure control, NG tube was placed. Overnight, apneas were noted with loud snoring and he was placed on CPAP without complication. [facility] M consulted to assist with OSA management and respiratory issues Patient is somnolent with intermittent eye opening and history is obtained from wife   SIGNIFICANT EVENTS    STUDIES:  Echo >> normal LV function, moderate LVH, diastolic dysfunction present  MRI brain 2/1>> bilateral cerebral watershed infarcts, small infarct and right cerebellum, unchanged thalamic bleed and edema compared to 1/31     PAST MEDICAL HISTORY :   has a past medical history of Diabetes mellitus and Hypertension.  has a past surgical history that includes Abscess drainage. Prior to Admission medications   Medication Sig Start Date End Date Taking? Authorizing Provider  aspirin 81 MG chewable tablet Chew 81 mg by mouth daily.   Yes Historical Provider, MD  ibuprofen (ADVIL,MOTRIN) 100 MG tablet Take 200 mg by mouth every 6 (six) hours as needed for pain.   Yes Historical Provider, MD  hydrochlorothiazide (HYDRODIURIL) 25 MG tablet Take 1 tablet (25 mg total) by mouth daily. 10/14/15 11/13/15  Daryl F de Villier II, PA  metFORMIN (GLUCOPHAGE) 500 MG tablet Take 1,000 mg by mouth daily with breakfast.    Historical Provider, MD   No Known Allergies  FAMILY  HISTORY:  family history is not on file. SOCIAL HISTORY:  reports that he has never smoked. He has never used smokeless tobacco. He reports that he uses drugs, including Marijuana. He reports that he does not drink alcohol.  REVIEW OF SYSTEMS:   Per wife, headache on admission Loud snoring, witnessed apneas and gasping breathing during sleep for many years-worse on his back  Constitutional: Negative for fever, chills, weight loss, malaise/fatigue and diaphoresis.  HENT: Negative for hearing loss, ear pain, nosebleeds, congestion, sore throat, neck pain, tinnitus and ear discharge.   Eyes: Negative for blurred vision, double vision, photophobia, pain, discharge and redness.  Respiratory: Negative for cough, hemoptysis, sputum production, wheezing and stridor.   Cardiovascular: Negative for chest pain, palpitations, orthopnea, claudication, leg swelling and PND.  Gastrointestinal: Negative for heartburn, nausea, vomiting, abdominal pain, diarrhea, constipation, blood in stool and melena.  Genitourinary: Negative for dysuria, urgency, frequency, hematuria and flank pain.  Musculoskeletal: Negative for myalgias, back pain, joint pain and falls.  Skin: Negative for itching and rash.  Neurological: Negative for dizziness, tingling, tremors, sensory change, speech change,  seizures, loss of consciousness Endo/Heme/Allergies: Negative for environmental allergies and polydipsia. Does not bruise/bleed easily.  SUBJECTIVE:   VITAL SIGNS: Temp:  [98.2 F (36.8 C)-99.8 F (37.7 C)] 98.9 F (37.2 C) (02/02 0800) Pulse Rate:  [73-100] 84 (02/02 1045) Resp:  [13-22] 20 (02/02 1045) BP: (108-173)/(58-100) 153/88 (02/02 1045) SpO2:  [95 %-100 %] 98 % (02/02 1045)  PHYSICAL EXAMINATION: Gen. Pleasant, obese, somnolent in bed with snoring respirations ENT - no lesions, no post nasal  drip, class 2-3 airway Neck: No JVD, no thyromegaly, no carotid bruits Lungs: no use of accessory muscles, no dullness  to percussion, decreased without rales or rhonchi  Cardiovascular: Rhythm regular, heart sounds  normal, no murmurs or gallops, no peripheral edema Abdomen: soft and non-tender, no hepatosplenomegaly, BS normal. Musculoskeletal: No deformities, no cyanosis or clubbing Neuro: Opens eyes spontaneously, does not follow commands, pupils 3 mm bilaterally reactive to light, left hemiplegia, most right arm spontaneously against gravity, plantar upgoing on right and downgoing on left    Recent Labs Lab 02/05/16 0250 02/06/16 0436 02/07/16 0634  NA 135 134* 133*  K 4.6 4.3 4.3  CL 105 104 101  CO2 21* 23 21*  BUN 25* 22* 23*  CREATININE 2.22* 1.93* 1.85*  GLUCOSE 166* 184* 173*    Recent Labs Lab 02/05/16 0250 02/06/16 0436 02/07/16 0634  HGB 12.2* 11.3* 11.7*  HCT 38.1* 36.0* 36.5*  WBC 11.6* 9.6 11.3*  PLT 205 218 252   Ct Head Wo Contrast  Result Date: 02/06/2016 CLINICAL DATA:  Altered mental status. EXAM: CT HEAD WITHOUT CONTRAST TECHNIQUE: Contiguous axial images were obtained from the base of the skull through the vertex without intravenous contrast. COMPARISON:  MRI head February 04, 2016 and CT HEAD February 03, 2006 FINDINGS: BRAIN: Unchanged 3 x 1.2 cm RIGHT thalamic hematoma extending toward midbrain with mass effect on third ventricle. No intraventricular extension. Mild stable obstructive hydrocephalus with interstitial edema on a background is confluent white matter hypodensities better characterized on recent MRI. Old LEFT thalamus lacunar infarct. No acute large vascular territory infarct. No abnormal extra-axial fluid collections. Basal cisterns are patent. VASCULAR: Unremarkable. SKULL/SOFT TISSUES: No skull fracture. No significant soft tissue swelling. ORBITS/SINUSES: The included ocular globes and orbital contents are normal.The mastoid aircells and included paranasal sinuses are well-aerated. 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 Mastoid air cells are well  aerated. IMPRESSION: Evolving RIGHT thalamic hemorrhage. Unchanged mild obstructive hydrocephalus. Chronic changes include old LEFT thalamus lacunar infarct and moderate to severe chronic small vessel ischemic disease better characterized on recent MRI. Electronically Signed   By: Elon Alas [name]   On: 02/06/2016 06:36   [name]  Result Date: 02/06/2016 CLINICAL DATA:  Intra cerebral hemorrhage. EXAM: MRI HEAD WITHOUT CONTRAST TECHNIQUE: Multiplanar, multiecho pulse sequences of the brain and surrounding structures were obtained without intravenous contrast. COMPARISON:  Head CT 02/06/2016 and MRI 02/04/2016 FINDINGS: Brain: There are numerous small foci of acute infarction in the frontal and parietal lobes bilaterally, predominantly involving the centrum semiovale and corona radiata in a watershed pattern and greatly increased in number from the prior MRI. New punctate acute periventricular temporoparietal infarcts are present bilaterally, and there is a new punctate acute infarct in the right superior cerebellar peduncle. Small infarct in the left external capsule is unchanged. Foci of chronic microhemorrhage are again seen in the left cerebellum, pons, and left cerebral hemisphere. 3.2 cm right thalamic hemorrhage is unchanged in size from the prior MRI, and surrounding edema is also unchanged. Effacement of the posterior third ventricle and mild lateral ventriculomegaly are unchanged. Confluent periventricular white matter T2 hyperintensity including in the temporal lobes is unchanged and compatible with transependymal CSF flow. Patchy cerebral white matter T2 hyperintensities elsewhere are also unchanged and consistent with moderate to severe chronic small vessel ischemic disease, greatly abnormal for age. There may be trace hemorrhage in the fourth ventricle. No extra-axial fluid collection. Vascular: Major intracranial vascular flow voids are preserved. Skull  and upper cervical spine:  Unremarkable bone marrow signal. Sinuses/Orbits: Unremarkable orbits. Trace paranasal sinus mucosal thickening. Clear mastoid air cells. Other: None. IMPRESSION: 1. Bilateral acute cerebral watershed infarcts, progressed from the recent MRI. 2. Punctate acute infarct in the right superior cerebellar peduncle. 3. Unchanged right thalamic hemorrhage with surrounding edema and mild obstructive hydrocephalus. 4. Extensive chronic small vessel ischemic disease. Electronically Signed   By: Logan Bores [name]   On: 02/06/2016 17:17   Dg Abd Portable 1v  Result Date: 02/06/2016 CLINICAL DATA:  Encounter for feeding tube placement. EXAM: PORTABLE ABDOMEN - 1 VIEW COMPARISON:  CT of the abdomen 02/09/2011 FINDINGS: Feeding catheter likely in transpyloric location, with tip overlying the expected location of the fourth portion of the duodenum. The bowel gas pattern is normal. No radio-opaque calculi or other significant radiographic abnormality are seen. IMPRESSION: Feeding catheter likely in transpyloric location. Nonobstructive bowel gas pattern. Electronically Signed   By: Fidela Salisbury [name]   On: 02/06/2016 12:02    ASSESSMENT / PLAN:  Intracerebral hemorrhage Bilateral acute watershed cerebral infarcts causing worsening mental status  -Management per neurology  Hypertensive emergency- off clevidipine now target systolic blood pressure 876 range to maintain cerebral perfusion  OSA with worsening related to acute stroke No evidence of central apneas or Cheyne-Stokes breathing at this time  -We'll maintain him on auto CPAP 5-20 cm, with goal of maintaining oxygen saturation in the absence of apneas and snoring. We will maintain him on CPAP mandatory at bedtime and when necessary daytime -Tube feeds are being initiated and we would have to be careful to stop tube feeds while on CPAP. -He is able to maintain his airway at this time. If he gets more tender and he would certainly need intubation for  airway protection. Unfortunately for him this would likely mean that he will end up with a tracheostomy  PCCM will follow with you  Kara Mead MD. FCCP. [facility] Pager [phone] If no response call [phone]    02/07/2016, 10:55 AM

## 2016-02-07 NOTE — Progress Notes (Signed)
[facility]  CONSULT SERVICE NOTE  Pt with continued/increased lethargy this morning. Unable to participate in therapy. Will follow along for clinical improvement before performing a formal rehab consult.    Meredith Staggers, MD, [facility] 02/07/2016

## 2016-02-07 NOTE — Progress Notes (Signed)
Transcranial Doppler  Date POD PCO2 HCT BP  MCA ACA PCA OPHT SIPH VERT Basilar  02/07/16 MS   36.5 142/83 Right  Left   66  84   -16  -71   34  28   19  25   23   *   -24  -26   *  *         Right  Left                                            Right  Left                                             Right  Left                                             Right  Left                                            Right  Left                                            Right  Left                                        MCA = Middle Cerebral Artery      OPHT = Opthalmic Artery     BASILAR = Basilar Artery   ACA = Anterior Cerebral Artery     SIPH = Carotid Siphon PCA = Posterior Cerebral Artery   VERT = Verterbral Artery                   Normal MCA = 62+\-12 ACA = 50+\-12 PCA = 42+\-23     *Unable to insonate.  02/07/2016 4:45 PM Maudry Mayhew, BS, RVT, RDCS, RDMS

## 2016-02-07 NOTE — Progress Notes (Signed)
*  PRELIMINARY RESULTS* Vascular Ultrasound Bilateral lower extremity venous duplex has been completed.  Preliminary findings: No evidence of deep vein thrombosis or baker's cysts bilaterally.  Everrett Coombe 02/07/2016, 11:45 AM

## 2016-02-08 DIAGNOSIS — I619 Nontraumatic intracerebral hemorrhage, unspecified: Secondary | ICD-10-CM

## 2016-02-08 DIAGNOSIS — I10 Essential (primary) hypertension: Secondary | ICD-10-CM | POA: Diagnosis present

## 2016-02-08 DIAGNOSIS — R4182 Altered mental status, unspecified: Secondary | ICD-10-CM | POA: Diagnosis present

## 2016-02-08 LAB — CBC
HEMATOCRIT: 36.7 % — AB (ref 39.0–52.0)
HEMOGLOBIN: 11.6 g/dL — AB (ref 13.0–17.0)
MCH: 27 pg (ref 26.0–34.0)
MCHC: 31.6 g/dL (ref 30.0–36.0)
MCV: 85.3 fL (ref 78.0–100.0)
Platelets: 208 10*3/uL (ref 150–400)
RBC: 4.3 MIL/uL (ref 4.22–5.81)
RDW: 15.6 % — AB (ref 11.5–15.5)
WBC: 14.4 10*3/uL — ABNORMAL HIGH (ref 4.0–10.5)

## 2016-02-08 LAB — GLUCOSE, CAPILLARY
GLUCOSE-CAPILLARY: 263 mg/dL — AB (ref 65–99)
GLUCOSE-CAPILLARY: 214 mg/dL — AB (ref 65–99)
GLUCOSE-CAPILLARY: 216 mg/dL — AB (ref 65–99)
Glucose-Capillary: 156 mg/dL — ABNORMAL HIGH (ref 65–99)
Glucose-Capillary: 168 mg/dL — ABNORMAL HIGH (ref 65–99)
Glucose-Capillary: 169 mg/dL — ABNORMAL HIGH (ref 65–99)
Glucose-Capillary: 175 mg/dL — ABNORMAL HIGH (ref 65–99)

## 2016-02-08 LAB — COMPREHENSIVE METABOLIC PANEL
ALBUMIN: 2.9 g/dL — AB (ref 3.5–5.0)
ALK PHOS: 37 U/L — AB (ref 38–126)
ALT: 15 U/L — ABNORMAL LOW (ref 17–63)
ANION GAP: 7 (ref 5–15)
AST: 15 U/L (ref 15–41)
BILIRUBIN TOTAL: 1 mg/dL (ref 0.3–1.2)
BUN: 27 mg/dL — ABNORMAL HIGH (ref 6–20)
CALCIUM: 8.6 mg/dL — AB (ref 8.9–10.3)
CO2: 24 mmol/L (ref 22–32)
Chloride: 106 mmol/L (ref 101–111)
Creatinine, Ser: 1.71 mg/dL — ABNORMAL HIGH (ref 0.61–1.24)
GFR, EST AFRICAN AMERICAN: 55 mL/min — AB (ref >60)
GFR, EST NON AFRICAN AMERICAN: 47 mL/min — AB (ref >60)
Glucose, Bld: 242 mg/dL — ABNORMAL HIGH (ref 65–99)
POTASSIUM: 4.2 mmol/L (ref 3.5–5.1)
Sodium: 137 mmol/L (ref 135–145)
TOTAL PROTEIN: 7.3 g/dL (ref 6.5–8.1)

## 2016-02-08 MED ORDER — MODAFINIL 100 MG PO TABS
100.0000 mg | ORAL_TABLET | Freq: Every day | ORAL | Status: DC
  Administered 2016-02-09: 100 mg via ORAL
  Filled 2016-02-08: qty 1

## 2016-02-08 NOTE — Progress Notes (Signed)
STROKE TEAM PROGRESS NOTE   SUBJECTIVE (INTERVAL HISTORY) No family at bedside. Continues to have snoring sound during the day.  Per RN tolerated BiPAP overnight. He is a candidate for Provigil.  Ordered to start tomorrow    OBJECTIVE Temp:  [98.2 F (36.8 C)-100.7 F (38.2 C)] 98.2 F (36.8 C) (02/03 1200) Pulse Rate:  [70-106] 84 (02/03 1200) Cardiac Rhythm: Normal sinus rhythm (02/03 1205) Resp:  [13-32] 19 (02/03 1200) BP: (108-175)/(66-121) 149/72 (02/03 1200) SpO2:  [97 %-100 %] 99 % (02/03 1200) Weight:  [176 kg (388 lb)] 176 kg (388 lb) (02/03 0500)  CBC:  Recent Labs Lab 02/03/16 1542  02/06/16 0436 02/07/16 0634  WBC 9.7  < > 9.6 11.3*  NEUTROABS 7.1  --   --   --   HGB 13.7  < > 11.3* 11.7*  HCT 42.8  < > 36.0* 36.5*  MCV 85.4  < > 85.3 84.1  PLT 272  < > 218 252  < > = values in this interval not displayed.  Basic Metabolic Panel:   Recent Labs Lab 02/06/16 0436 02/07/16 0634  NA 134* 133*  K 4.3 4.3  CL 104 101  CO2 23 21*  GLUCOSE 184* 173*  BUN 22* 23*  CREATININE 1.93* 1.85*  CALCIUM 8.5* 8.7*    Lipid Panel:     Component Value Date/Time   CHOL 153 02/04/2016 0742   TRIG 174 (H) 02/04/2016 0742   HDL 40 (L) 02/04/2016 0742   CHOLHDL 3.8 02/04/2016 0742   VLDL 35 02/04/2016 0742   LDLCALC 78 02/04/2016 0742   HgbA1c:  Lab Results  Component Value Date   HGBA1C 7.8 (H) 02/06/2016   Urine Drug Screen:     Component Value Date/Time   LABOPIA POSITIVE (A) 02/04/2016 0530   COCAINSCRNUR NONE DETECTED 02/04/2016 0530   LABBENZ NONE DETECTED 02/04/2016 0530   AMPHETMU NONE DETECTED 02/04/2016 0530   THCU POSITIVE (A) 02/04/2016 0530   LABBARB NONE DETECTED 02/04/2016 0530      IMAGING I have personally reviewed the radiological images below and agree with the radiology interpretations.  Ct Head Wo Contrast 02/04/2016 Evolving 11 x 30 mm RIGHT caudal thalamic hematoma resulting in mild obstructive hydrocephalus. Severe white  matter changes most compatible with chronic hypertensive encephalopathy, recommend MRI of the head on nonemergent basis for further characterization.   02/03/2016 Interval development of intraparenchymal hemorrhage seen in the right thalamus with surrounding white matter edema. Mild dilatation of lateral ventricles is noted.   Mri and Mra Brain Wo Contrast 02/05/2016 IMPRESSION: MRI HEAD: Evolving RIGHT thalamic hematoma. Effaced 3rd ventricle with mild acute hydrocephalus. Bilateral sub centimeter acute watershed territory infarcts. Moderate to severe white matter changes consistent with interstitial edema and chronic small vessel ischemic disease. MRA HEAD: No acute vascular process. Mildly ectatic basilar artery.   2D echo  Left ventricle: The cavity size was normal. Wall thickness was   increased in a pattern of moderate LVH. Systolic function was   normal. The estimated ejection fraction was in the range of 55%   to 60%. Wall motion was normal; there were no regional wall   motion abnormalities. Doppler parameters are consistent with   restrictive physiology, indicative of decreased left ventricular   diastolic compliance and/or increased left atrial pressure. Impressions: - Extremely limited; definity used; normal LV systolic function;   moderate LVH; restrictive filling.  CUS - Bilateral 1-39% ICA stenosis, antegrade vertebral flow. Difficult exam due to patient cooperation, snoring and  body habitus.  Ct Head Wo Contrast 02/06/2016 IMPRESSION: Evolving RIGHT thalamic hemorrhage. Unchanged mild obstructive hydrocephalus. Chronic changes include old LEFT thalamus lacunar infarct and moderate to severe chronic small vessel ischemic disease better characterized on recent MRI.   [name] 02/06/2016 IMPRESSION: 1. Bilateral acute cerebral watershed infarcts, progressed from the recent MRI. 2. Punctate acute infarct in the right superior cerebellar peduncle. 3. Unchanged right  thalamic hemorrhage with surrounding edema and mild obstructive hydrocephalus. 4. Extensive chronic small vessel ischemic disease.    LE venous doppler - No evidence of deep vein thrombosis or baker's cysts bilaterally.  TCD - no vasospasm   PHYSICAL EXAM  Temp:  [98.2 F (36.8 C)-100.7 F (38.2 C)] 98.2 F (36.8 C) (02/03 1200) Pulse Rate:  [70-106] 84 (02/03 1200) Resp:  [13-32] 19 (02/03 1200) BP: (108-175)/(66-121) 149/72 (02/03 1200) SpO2:  [97 %-100 %] 99 % (02/03 1200) Weight:  [176 kg (388 lb)] 176 kg (388 lb) (02/03 0500)  General - Well nourished, well developed, in no apparent distress, Drowsy sleepy.  Cardiovascular - Regular rate and rhythm. Respiratory - snoring breathing but able to protect airway Abdomen:  Soft, obese, ND normal bowel sounds Extremities:  No C/C/E  NEURO Drowsy sleepy, and briefly open eyes but not on command. No vocalization  PERRL, eyes middle position, positive corneal, left facial droop. Weak spontaneous cough  Motor/Sensory:  With painful stimulation RUE and RLE purposeful movement, but LUE and LLE mild withdrawal.   Coordination and gait not tested due to LOC   ASSESSMENT/PLAN Mr. TENZIN EDELMAN is a 46 y.o. male with history of hypertension and hyperlipidemia presenting with headache and left-sided weakness. CT showed a right thalamic intracerebral hemorrhage in setting of elevated blood pressure. He was started on IV Cardene admitted to the neuro ICU.  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   Resultant  left hemiparesis and left facial droop, AMS  CT head x 2 evolving right thalamic ICH with mild obstructive hydrocephalus.   MRI - evolving RIGHT thalamic hematoma. Mild acute hydrocephalus. Bilateral sub centimeter acute watershed territory infarcts.   MRA  unremarkable   MR repeat - increase bilateral watershed territory infarcts.  Carotid Doppler  unremarkable   2D Echo  EF 55-60%   LE  venous doppler - no DVT  LDL 78  HgbA1c 7.9  SCDs for VTE prophylaxis Diet NPO time specified  aspirin 81 mg daily prior to admission, now on No antithrombotic secondary to hemorrhage  Ongoing aggressive stroke risk factor management  Therapy recommendations:  Pending  Disposition:  Pending  Bilateral watershed territory infarcts - may be due to strict BP control and hypoxia/hypercapnia from OSA.  May explain mental status changes  May be related to strict BP control and hypoxia/hypercarbia from OSA  Currently BP goal < 140, it was  relaxed to < 160 on 2/2  On CPAP at night, will see if CCM agrees to put CPAP during the day.   TCD no sign of vasospasm  Hypertensive emergency  Blood pressure 211/115 on arrival in setting of neurologic symptoms   Started on Cardene in the ED for control, changed to Cleviprex  off labetalol drip  SBP goal < 140, will relax to < 160 due to bilateral watershed infarcts  on po meds with labetalol, amlodipine and hydralazine  Tapered off Cleviprex as able  Hyperlipidemia  Home meds:  No statin  LDL 78  Add Lipitor 10  Continue statin on discharge  Diabetes  Hyperglycemia   Check CBGs every 4 hours - currently NPO  SSI  HgbA1c 7.9 , goal < 7.0  Uncontrolled  increase Lantus 20 units daily at bedtime  Dysphagia   NG tube feeding  105 cc/h during the day and off at night for CPAP  CKD, stage III  Creatinine 2.14->2.22-> 1.93->1.85 labs pending today  Likely due to uncontrolled HTN and DM  On IV fluid  Other Stroke Risk Factors  Marijuana user, 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   Morbid Obesity, Body mass index is 51.19 kg/m., recommend weight loss, diet and exercise as appropriate   OSA - CCM on board for CPAP  Other Active Problems  Leukocytosis 12.1 -> 11.6-> 9.6->11.3 labs pending today  Right cheek swelling - intact oral mucosa - concerning for dental abscess - put on magic mouth wash  Hospital day # 5  This  patient is critically ill due to [place] with mild hydrocephalus, bilateral watershed infarcts, hypertensive emergency, uncontrolled diabetes and at significant risk of neurological worsening, death form hematoma extension, obstructive hydrocephalus, infarct extension, heart failure, DKA, and brain death. This patient's care requires constant monitoring of vital signs, hemodynamics, respiratory and cardiac monitoring, review of multiple databases, neurological assessment, discussion with family, other specialists and medical decision making of high complexity. I spent 40 minutes of neurocritical care time in the care of this patient.  To contact Stroke Continuity provider, please refer to [email or web address] After hours, contact General Neurology

## 2016-02-08 NOTE — Progress Notes (Signed)
Name: Adam Vincent MRN: [id number] DOB: 24-Feb-1970    ADMISSION DATE:  02/03/2016 CONSULTATION DATE:  02/08/2016  REFERRING MD :  Erlinda Hong,   CHIEF COMPLAINT:  Obstructive sleep apnea, stroke  HISTORY OF PRESENT ILLNESS:  46 year old unfortunate obese man with hypertension admitted 1/29 with sudden onset headache and found to have right thalamic bleed with surrounding edema. Blood pressure was controlled with Cardene. He initially improved somewhat and was able to get out of bed to chair but then mental status worsened again and MRI showed bilateral cerebral watershed infarcts. He was placed on clevidipine for blood pressure control, NG tube was placed. Overnight, apneas were noted with loud snoring and he was placed on CPAP without complication. [facility] M consulted to assist with OSA management and respiratory issues Patient is somnolent with intermittent eye opening and history is obtained from wife   SIGNIFICANT EVENTS    STUDIES:  Echo >> normal LV function, moderate LVH, diastolic dysfunction present  MRI brain 2/1>> bilateral cerebral watershed infarcts, small infarct and right cerebellum, unchanged thalamic bleed and edema compared to 1/31     PAST MEDICAL HISTORY :   has a past medical history of Diabetes mellitus and Hypertension.  has a past surgical history that includes Abscess drainage. Prior to Admission medications   Medication Sig Start Date End Date Taking? Authorizing Provider  aspirin 81 MG chewable tablet Chew 81 mg by mouth daily.   Yes Historical Provider, MD  ibuprofen (ADVIL,MOTRIN) 100 MG tablet Take 200 mg by mouth every 6 (six) hours as needed for pain.   Yes Historical Provider, MD  hydrochlorothiazide (HYDRODIURIL) 25 MG tablet Take 1 tablet (25 mg total) by mouth daily. 10/14/15 11/13/15  Daryl F de Villier II, PA  metFORMIN (GLUCOPHAGE) 500 MG tablet Take 1,000 mg by mouth daily with breakfast.    Historical Provider, MD   No Known Allergies  FAMILY  HISTORY:  family history is not on file. SOCIAL HISTORY:  reports that he has never smoked. He has never used smokeless tobacco. He reports that he uses drugs, including Marijuana. He reports that he does not drink alcohol.  REVIEW OF SYSTEMS:   Per wife, headache on admission Loud snoring, witnessed apneas and gasping breathing during sleep for many years-worse on his back  Constitutional: Negative for fever, chills, weight loss, malaise/fatigue and diaphoresis.  HENT: Negative for hearing loss, ear pain, nosebleeds, congestion, sore throat, neck pain, tinnitus and ear discharge.   Eyes: Negative for blurred vision, double vision, photophobia, pain, discharge and redness.  Respiratory: Negative for cough, hemoptysis, sputum production, wheezing and stridor.   Cardiovascular: Negative for chest pain, palpitations, orthopnea, claudication, leg swelling and PND.  Gastrointestinal: Negative for heartburn, nausea, vomiting, abdominal pain, diarrhea, constipation, blood in stool and melena.  Genitourinary: Negative for dysuria, urgency, frequency, hematuria and flank pain.  Musculoskeletal: Negative for myalgias, back pain, joint pain and falls.  Skin: Negative for itching and rash.  Neurological: Negative for dizziness, tingling, tremors, sensory change, speech change,  seizures, loss of consciousness Endo/Heme/Allergies: Negative for environmental allergies and polydipsia. Does not bruise/bleed easily.  SUBJECTIVE:  Pt has continued to have snoring respiratory pattern Tolerated CPAp last night No tachypnea, secretions or desats reported.   VITAL SIGNS: Temp:  [98.2 F (36.8 C)-100.7 F (38.2 C)] 98.2 F (36.8 C) (02/03 1200) Pulse Rate:  [70-106] 84 (02/03 1200) Resp:  [13-32] 19 (02/03 1200) BP: (108-175)/(66-121) 149/72 (02/03 1200) SpO2:  [98 %-100 %] 99 % (02/03 1200)  Weight:  [176 kg (388 lb)] 176 kg (388 lb) (02/03 0500)  PHYSICAL EXAMINATION: Gen. Obese man, sleeping ENT -  large tongue, snoring but no secretions Neck: No stridor Lungs: normal comfortable rep pattern, currently off CPAP, decreased at bases Cardiovascular: regular, no M Abdomen: soft, benign, obese Musculoskeletal: no deformities Neuro: opened eyes to voice, appeared to track, will not follow commands    Recent Labs Lab 02/05/16 0250 02/06/16 0436 02/07/16 0634  NA 135 134* 133*  K 4.6 4.3 4.3  CL 105 104 101  CO2 21* 23 21*  BUN 25* 22* 23*  CREATININE 2.22* 1.93* 1.85*  GLUCOSE 166* 184* 173*    Recent Labs Lab 02/05/16 0250 02/06/16 0436 02/07/16 0634  HGB 12.2* 11.3* 11.7*  HCT 38.1* 36.0* 36.5*  WBC 11.6* 9.6 11.3*  PLT 205 218 252   [name]  Result Date: 02/06/2016 CLINICAL DATA:  Intra cerebral hemorrhage. EXAM: MRI HEAD WITHOUT CONTRAST TECHNIQUE: Multiplanar, multiecho pulse sequences of the brain and surrounding structures were obtained without intravenous contrast. COMPARISON:  Head CT 02/06/2016 and MRI 02/04/2016 FINDINGS: Brain: There are numerous small foci of acute infarction in the frontal and parietal lobes bilaterally, predominantly involving the centrum semiovale and corona radiata in a watershed pattern and greatly increased in number from the prior MRI. New punctate acute periventricular temporoparietal infarcts are present bilaterally, and there is a new punctate acute infarct in the right superior cerebellar peduncle. Small infarct in the left external capsule is unchanged. Foci of chronic microhemorrhage are again seen in the left cerebellum, pons, and left cerebral hemisphere. 3.2 cm right thalamic hemorrhage is unchanged in size from the prior MRI, and surrounding edema is also unchanged. Effacement of the posterior third ventricle and mild lateral ventriculomegaly are unchanged. Confluent periventricular white matter T2 hyperintensity including in the temporal lobes is unchanged and compatible with transependymal CSF flow. Patchy cerebral white  matter T2 hyperintensities elsewhere are also unchanged and consistent with moderate to severe chronic small vessel ischemic disease, greatly abnormal for age. There may be trace hemorrhage in the fourth ventricle. No extra-axial fluid collection. Vascular: Major intracranial vascular flow voids are preserved. Skull and upper cervical spine: Unremarkable bone marrow signal. Sinuses/Orbits: Unremarkable orbits. Trace paranasal sinus mucosal thickening. Clear mastoid air cells. Other: None. IMPRESSION: 1. Bilateral acute cerebral watershed infarcts, progressed from the recent MRI. 2. Punctate acute infarct in the right superior cerebellar peduncle. 3. Unchanged right thalamic hemorrhage with surrounding edema and mild obstructive hydrocephalus. 4. Extensive chronic small vessel ischemic disease. Electronically Signed   By: Logan Bores [name]   On: 02/06/2016 17:17    ASSESSMENT / PLAN:  Intracerebral hemorrhage Bilateral acute watershed cerebral infarcts causing worsening mental status  Hypertensive emergency - follow BP off cleviprex  OSA that is clearly evidence on exam when sleeping, appears to be protecting airway, managing secretions. No evidence resp distress.  - will continue CPAP qhs and also during the day - follow closely for any evidence decompensation airway protection, secretion management. He is at risk for intubation  Acute renal failure - follow BMP  Independent CC time 34 minutes  Baltazar Apo, MD, PhD 02/08/2016, 2:02 PM [facility] [phone] or if no answer [phone]

## 2016-02-08 NOTE — Progress Notes (Signed)
Patient placed on CPAP for the night without complication. RT will continue to monitor as needed. 

## 2016-02-08 NOTE — Progress Notes (Signed)
SLP Cancellation Note  Patient Details Name: ARUL FARABEE MRN: [id number] DOB: 1970-02-03   Cancelled treatment:       Reason Eval/Treat Not Completed: Fatigue/lethargy limiting ability to participate;Patient's level of consciousness. Unable to arouse patient. RN did state that recent MRI revealed new cerebral infarcts.   Sonia Baller, MA, CCC-SLP Speech Therapy [facility]

## 2016-02-09 LAB — BASIC METABOLIC PANEL
Anion gap: 6 (ref 5–15)
BUN: 30 mg/dL — AB (ref 6–20)
CALCIUM: 8.4 mg/dL — AB (ref 8.9–10.3)
CO2: 25 mmol/L (ref 22–32)
CREATININE: 1.7 mg/dL — AB (ref 0.61–1.24)
Chloride: 107 mmol/L (ref 101–111)
GFR calc Af Amer: 55 mL/min — ABNORMAL LOW (ref >60)
GFR, EST NON AFRICAN AMERICAN: 48 mL/min — AB (ref >60)
GLUCOSE: 163 mg/dL — AB (ref 65–99)
POTASSIUM: 4.4 mmol/L (ref 3.5–5.1)
SODIUM: 138 mmol/L (ref 135–145)

## 2016-02-09 LAB — CBC
HCT: 34.3 % — ABNORMAL LOW (ref 39.0–52.0)
Hemoglobin: 10.9 g/dL — ABNORMAL LOW (ref 13.0–17.0)
MCH: 27.3 pg (ref 26.0–34.0)
MCHC: 31.8 g/dL (ref 30.0–36.0)
MCV: 85.8 fL (ref 78.0–100.0)
Platelets: 220 10*3/uL (ref 150–400)
RBC: 4 MIL/uL — AB (ref 4.22–5.81)
RDW: 15.4 % (ref 11.5–15.5)
WBC: 12.6 10*3/uL — ABNORMAL HIGH (ref 4.0–10.5)

## 2016-02-09 LAB — GLUCOSE, CAPILLARY
GLUCOSE-CAPILLARY: 161 mg/dL — AB (ref 65–99)
GLUCOSE-CAPILLARY: 196 mg/dL — AB (ref 65–99)
GLUCOSE-CAPILLARY: 225 mg/dL — AB (ref 65–99)
Glucose-Capillary: 136 mg/dL — ABNORMAL HIGH (ref 65–99)
Glucose-Capillary: 214 mg/dL — ABNORMAL HIGH (ref 65–99)

## 2016-02-09 LAB — BLOOD GAS, ARTERIAL
Acid-base deficit: 0.6 mmol/L (ref 0.0–2.0)
BICARBONATE: 23.4 mmol/L (ref 20.0–28.0)
Drawn by: 244801
FIO2: 21
O2 Saturation: 97.6 %
PATIENT TEMPERATURE: 98.6
PCO2 ART: 37.8 mmHg (ref 32.0–48.0)
PH ART: 7.409 (ref 7.350–7.450)
PO2 ART: 99.3 mmHg (ref 83.0–108.0)

## 2016-02-09 MED ORDER — MODAFINIL 100 MG PO TABS: 100.0000 mg | ORAL_TABLET | Freq: Every day | ORAL | Status: DC

## 2016-02-09 MED ORDER — MODAFINIL 100 MG PO TABS
100.0000 mg | ORAL_TABLET | Freq: Every day | ORAL | Status: DC
  Administered 2016-02-09 – 2016-02-10 (×2): 100 mg via ORAL
  Filled 2016-02-09 (×3): qty 1

## 2016-02-09 MED ORDER — MODAFINIL 100 MG PO TABS
100.0000 mg | ORAL_TABLET | Freq: Every day | ORAL | Status: DC
  Administered 2016-02-10 – 2016-02-11 (×2): 100 mg via ORAL
  Filled 2016-02-09 (×2): qty 1

## 2016-02-09 NOTE — Progress Notes (Signed)
   Name: Adam Vincent MRN: [id number] DOB: 1970-12-24    ADMISSION DATE:  02/03/2016 CONSULTATION DATE:  02/09/2016  REFERRING MD :  Erlinda Hong,   CHIEF COMPLAINT:  Obstructive sleep apnea, stroke  HISTORY OF PRESENT ILLNESS:  46 year old unfortunate obese man with hypertension admitted 1/29 with sudden onset headache and found to have right thalamic bleed with surrounding edema. Blood pressure was controlled with Cardene. He initially improved somewhat and was able to get out of bed to chair but then mental status worsened again and MRI showed bilateral cerebral watershed infarcts. He was placed on clevidipine for blood pressure control, NG tube was placed. Overnight, apneas were noted with loud snoring and he was placed on CPAP without complication. [facility] M consulted to assist with OSA management and respiratory issues Patient is somnolent with intermittent eye opening and history is obtained from wife   SIGNIFICANT EVENTS    STUDIES:  Echo >> normal LV function, moderate LVH, diastolic dysfunction present  MRI brain 2/1>> bilateral cerebral watershed infarcts, small infarct and right cerebellum, unchanged thalamic bleed and edema compared to 1/31  SUBJECTIVE:  No instances of poor secretion management or compromised resp status reported.  Her was placed on CPAP last night 23:30, remains on it currently  VITAL SIGNS: Temp:  [98.2 F (36.8 C)-100.6 F (38.1 C)] 98.6 F (37 C) (02/04 0755) Pulse Rate:  [75-104] 87 (02/04 0600) Resp:  [15-23] 17 (02/04 0600) BP: (108-172)/(64-100) 158/82 (02/04 0600) SpO2:  [98 %-100 %] 100 % (02/04 0600)  PHYSICAL EXAMINATION: Gen. Obese man, sleeping ENT - large tongue, snoring but no secretions Neck: No stridor Lungs: normal comfortable rep pattern, currently off CPAP, decreased at bases Cardiovascular: regular, no M Abdomen: soft, benign, obese Musculoskeletal: no deformities Neuro: opened eyes to voice, appeared to track, will not follow  commands    Recent Labs Lab 02/07/16 0634 02/08/16 1342 02/09/16 0608  NA 133* 137 138  K 4.3 4.2 4.4  CL 101 106 107  CO2 21* 24 25  BUN 23* 27* 30*  CREATININE 1.85* 1.71* 1.70*  GLUCOSE 173* 242* 163*    Recent Labs Lab 02/07/16 0634 02/08/16 1342 02/09/16 0608  HGB 11.7* 11.6* 10.9*  HCT 36.5* 36.7* 34.3*  WBC 11.3* 14.4* 12.6*  PLT 252 208 220   No results found.  ASSESSMENT / PLAN:  Intracerebral hemorrhage Bilateral acute watershed cerebral infarcts with suppressed MS  Hypertensive emergency - currently off cleviprex   OSA that is worse given his current MS - CPAP qhs, use during the day when sleeping - at risk for decompensation due to poor airway protection, secretion management. Need to follow MS and resp status closely. Would intubate if he declines. If intubated he would likely need trach depending on pace neuro recovery  Acute renal failure - currently stable, follow BMP  Independent CC time 32 minutes  Baltazar Apo, MD, PhD 02/09/2016, 8:22 AM [facility] [phone] or if no answer [phone]

## 2016-02-09 NOTE — Progress Notes (Signed)
SLP Cancellation Note  Patient Details Name: Adam Vincent MRN: [id number] DOB: 1970-11-17   Cancelled treatment:       Reason Eval/Treat Not Completed: Fatigue/lethargy limiting ability to participate;Patient's level of consciousness  Deneise Lever, [place] CF-SLP Speech-Language Pathologist [phone]  Aliene Altes 02/09/2016, 10:22 AM

## 2016-02-09 NOTE — Progress Notes (Signed)
STROKE TEAM PROGRESS NOTE   SUBJECTIVE (INTERVAL HISTORY) No family at bedside. Continues to have snoring sounds during the day.  Per RN tolerated BiPAP overnight. Reviewed CCM evaluation and agree that patient is at risk given body habitus, secretions and sleep apnea.  Will try to reach out to family today to determine goals of care for the long-term.  First dose of Provigil scheduled for this AM  OBJECTIVE Temp:  [98.2 F (36.8 C)-100.6 F (38.1 C)] 98.6 F (37 C) (02/04 0755) Pulse Rate:  [75-104] 95 (02/04 0905) Cardiac Rhythm: Normal sinus rhythm (02/04 0700) Resp:  [16-23] 18 (02/04 0905) BP: (108-172)/(53-100) 164/90 (02/04 0905) SpO2:  [98 %-100 %] 100 % (02/04 0905) FiO2 (%):  [21 %] 21 % (02/04 0905)  CBC:  Recent Labs Lab 02/03/16 1542  02/08/16 1342 02/09/16 0608  WBC 9.7  < > 14.4* 12.6*  NEUTROABS 7.1  --   --   --   HGB 13.7  < > 11.6* 10.9*  HCT 42.8  < > 36.7* 34.3*  MCV 85.4  < > 85.3 85.8  PLT 272  < > 208 220  < > = values in this interval not displayed.  Basic Metabolic Panel:   Recent Labs Lab 02/08/16 1342 02/09/16 0608  NA 137 138  K 4.2 4.4  CL 106 107  CO2 24 25  GLUCOSE 242* 163*  BUN 27* 30*  CREATININE 1.71* 1.70*  CALCIUM 8.6* 8.4*    Lipid Panel:     Component Value Date/Time   CHOL 153 02/04/2016 0742   TRIG 174 (H) 02/04/2016 0742   HDL 40 (L) 02/04/2016 0742   CHOLHDL 3.8 02/04/2016 0742   VLDL 35 02/04/2016 0742   LDLCALC 78 02/04/2016 0742   HgbA1c:  Lab Results  Component Value Date   HGBA1C 7.8 (H) 02/06/2016   Urine Drug Screen:     Component Value Date/Time   LABOPIA POSITIVE (A) 02/04/2016 0530   COCAINSCRNUR NONE DETECTED 02/04/2016 0530   LABBENZ NONE DETECTED 02/04/2016 0530   AMPHETMU NONE DETECTED 02/04/2016 0530   THCU POSITIVE (A) 02/04/2016 0530   LABBARB NONE DETECTED 02/04/2016 0530      IMAGING I have personally reviewed the radiological images below and agree with the radiology  interpretations.  Ct Head Wo Contrast 02/04/2016 Evolving 11 x 30 mm RIGHT caudal thalamic hematoma resulting in mild obstructive hydrocephalus. Severe white matter changes most compatible with chronic hypertensive encephalopathy, recommend MRI of the head on nonemergent basis for further characterization.   02/03/2016 Interval development of intraparenchymal hemorrhage seen in the right thalamus with surrounding white matter edema. Mild dilatation of lateral ventricles is noted.   Mri and Mra Brain Wo Contrast 02/05/2016 IMPRESSION: MRI HEAD: Evolving RIGHT thalamic hematoma. Effaced 3rd ventricle with mild acute hydrocephalus. Bilateral sub centimeter acute watershed territory infarcts. Moderate to severe white matter changes consistent with interstitial edema and chronic small vessel ischemic disease. MRA HEAD: No acute vascular process. Mildly ectatic basilar artery.   2D echo  Left ventricle: The cavity size was normal. Wall thickness was   increased in a pattern of moderate LVH. Systolic function was   normal. The estimated ejection fraction was in the range of 55%   to 60%. Wall motion was normal; there were no regional wall   motion abnormalities. Doppler parameters are consistent with   restrictive physiology, indicative of decreased left ventricular   diastolic compliance and/or increased left atrial pressure. Impressions: - Extremely limited; definity used; normal  LV systolic function;   moderate LVH; restrictive filling.  CUS - Bilateral 1-39% ICA stenosis, antegrade vertebral flow. Difficult exam due to patient cooperation, snoring and body habitus.  Ct Head Wo Contrast 02/06/2016 IMPRESSION: Evolving RIGHT thalamic hemorrhage. Unchanged mild obstructive hydrocephalus. Chronic changes include old LEFT thalamus lacunar infarct and moderate to severe chronic small vessel ischemic disease better characterized on recent MRI.   [name] 02/06/2016 IMPRESSION: 1.  Bilateral acute cerebral watershed infarcts, progressed from the recent MRI. 2. Punctate acute infarct in the right superior cerebellar peduncle. 3. Unchanged right thalamic hemorrhage with surrounding edema and mild obstructive hydrocephalus. 4. Extensive chronic small vessel ischemic disease.    LE venous doppler - No evidence of deep vein thrombosis or baker's cysts bilaterally.  TCD - no vasospasm   PHYSICAL EXAM  Temp:  [98.2 F (36.8 C)-100.6 F (38.1 C)] 98.6 F (37 C) (02/04 0755) Pulse Rate:  [75-104] 95 (02/04 0905) Resp:  [16-23] 18 (02/04 0905) BP: (108-172)/(53-100) 164/90 (02/04 0905) SpO2:  [98 %-100 %] 100 % (02/04 0905) FiO2 (%):  [21 %] 21 % (02/04 0905)  General - Well nourished, well developed, in no apparent distress, Drowsy sleepy.  Cardiovascular - Regular rate and rhythm. Respiratory - snoring breathing but able to protect airway Abdomen:  Soft, obese, ND normal bowel sounds Extremities:  No C/C/E  NEURO Drowsy sleepy, and briefly open eyes but not on command. No vocalization  PERRL, eyes middle position, positive corneal, left facial droop. Weak spontaneous cough  Motor/Sensory:  With painful stimulation RUE and RLE purposeful movement, but LUE and LLE mild withdrawal.   Coordination and gait not tested due to LOC   ASSESSMENT/PLAN [name] is a 46 y.o. male with history of hypertension and hyperlipidemia presenting with headache and left-sided weakness. CT showed a right thalamic intracerebral hemorrhage in setting of elevated blood pressure. He was started on IV Cardene admitted to the neuro ICU.  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   Resultant  left hemiparesis and left facial droop, AMS  CT head x 2 evolving right thalamic ICH with mild obstructive hydrocephalus.   MRI - evolving RIGHT thalamic hematoma. Mild acute hydrocephalus. Bilateral sub centimeter acute watershed territory infarcts.   MRA   unremarkable   MR repeat - increase bilateral watershed territory infarcts.  Carotid Doppler  unremarkable   2D Echo  EF 55-60%   LE venous doppler - no DVT  LDL 78  HgbA1c 7.9  SCDs for VTE prophylaxis Diet NPO time specified  aspirin 81 mg daily prior to admission, now on No antithrombotic secondary to hemorrhage  Ongoing aggressive stroke risk factor management  Therapy recommendations:  Pending  Disposition:  Pending  Bilateral watershed territory infarcts - may be due to strict BP control and hypoxia/hypercapnia from OSA.  May explain mental status changes  May be related to strict BP control and hypoxia/hypercarbia from OSA  Currently BP goal < 140, it was  relaxed to < 160 on 2/2  On CPAP at night, will see if CCM agrees to put CPAP during the day.   TCD no sign of vasospasm  Hypertensive emergency  Blood pressure 211/115 on arrival in setting of neurologic symptoms   Started on Cardene in the ED for control, changed to Cleviprex  off labetalol drip  SBP goal < 140, will relax to < 160 due to bilateral watershed infarcts  on po meds with labetalol, amlodipine and hydralazine  Tapered  off Cleviprex as able  Hyperlipidemia  Home meds:  No statin  LDL 78  Add Lipitor 10  Continue statin on discharge  Diabetes  Hyperglycemia   Check CBGs every 4 hours - currently NPO  SSI  HgbA1c 7.9 , goal < 7.0  Uncontrolled  increase Lantus 20 units daily at bedtime  Dysphagia   NG tube feeding  105 cc/h during the day and off at night for CPAP  CKD, stage III  Creatinine 2.14->2.22-> 1.93->1.85 labs pending today  Likely due to uncontrolled HTN and DM  On IV fluid  Other Stroke Risk Factors  Marijuana user, 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  Will monitor WBCs.  May need to treat empirically  Blood glucose measures remain elevated will increase SSI coverage and in AM will review.  May need to adjust Lantus again  Hospital day # 6  This patient is critically ill due to [place] with mild hydrocephalus, bilateral watershed infarcts, hypertensive emergency, uncontrolled diabetes and at significant risk of neurological worsening, death form hematoma extension, obstructive hydrocephalus, infarct extension, heart failure, DKA, and brain death. This patient's care requires constant monitoring of vital signs, hemodynamics, respiratory and cardiac monitoring, review of multiple databases, neurological assessment, discussion with family, other specialists and medical decision making of high complexity. I spent 40 minutes of neurocritical care time in the care of this patient.  To contact Stroke Continuity provider, please refer to [email or web address] After hours, contact General Neurology

## 2016-02-09 NOTE — Progress Notes (Signed)
Patient placed on CPAP without complication. Verified that tube feeding was off before patient was placed on CPAP.

## 2016-02-10 ENCOUNTER — Inpatient Hospital Stay (HOSPITAL_COMMUNITY): Payer: Medicaid Other

## 2016-02-10 LAB — GLUCOSE, CAPILLARY
GLUCOSE-CAPILLARY: 148 mg/dL — AB (ref 65–99)
GLUCOSE-CAPILLARY: 157 mg/dL — AB (ref 65–99)
GLUCOSE-CAPILLARY: 219 mg/dL — AB (ref 65–99)
GLUCOSE-CAPILLARY: 228 mg/dL — AB (ref 65–99)
Glucose-Capillary: 161 mg/dL — ABNORMAL HIGH (ref 65–99)
Glucose-Capillary: 178 mg/dL — ABNORMAL HIGH (ref 65–99)
Glucose-Capillary: 239 mg/dL — ABNORMAL HIGH (ref 65–99)

## 2016-02-10 LAB — CBC
HCT: 33.9 % — ABNORMAL LOW (ref 39.0–52.0)
HEMOGLOBIN: 10.7 g/dL — AB (ref 13.0–17.0)
MCH: 27.2 pg (ref 26.0–34.0)
MCHC: 31.6 g/dL (ref 30.0–36.0)
MCV: 86.3 fL (ref 78.0–100.0)
Platelets: 247 10*3/uL (ref 150–400)
RBC: 3.93 MIL/uL — AB (ref 4.22–5.81)
RDW: 15.5 % (ref 11.5–15.5)
WBC: 12.7 10*3/uL — AB (ref 4.0–10.5)

## 2016-02-10 LAB — BASIC METABOLIC PANEL
ANION GAP: 7 (ref 5–15)
BUN: 30 mg/dL — ABNORMAL HIGH (ref 6–20)
CALCIUM: 8.3 mg/dL — AB (ref 8.9–10.3)
CO2: 25 mmol/L (ref 22–32)
Chloride: 108 mmol/L (ref 101–111)
Creatinine, Ser: 1.87 mg/dL — ABNORMAL HIGH (ref 0.61–1.24)
GFR, EST AFRICAN AMERICAN: 49 mL/min — AB (ref >60)
GFR, EST NON AFRICAN AMERICAN: 42 mL/min — AB (ref >60)
GLUCOSE: 165 mg/dL — AB (ref 65–99)
Potassium: 4.6 mmol/L (ref 3.5–5.1)
SODIUM: 140 mmol/L (ref 135–145)

## 2016-02-10 NOTE — Progress Notes (Signed)
Occupational Therapy Treatment Patient Details Name: Adam Vincent MRN: [id number] DOB: 1970-10-02 Today's Date: 02/10/2016    History of present illness This 46 y.o. male admitted with generalized weakness and cough. Head CT-Evolving RIGHT thalamic hemorrhage. MRI-Evolving RIGHT thalamic hematoma. Effaced 3rd ventricle with mild acute hydrocephalus. Bilateral sub centimeter acute watershed territory infarcts. PMH includes:  CAD with multiple stents, COPD on 2L 02, HTN, CKDIII, parosysmal A-fib, h/o CVA, syncope, PNA, h/o substance abuse, conversion, anxiety disorder, cognitive impairment.    OT comments  Pt more alert this session compared to last week, however, continues with significant change from evaluation.  He sat EOB x ~20 mins with total A +2, he makes no attempt to right posture.  He will look to Lt today.  He followed one step command to stick out his tongue, and sounds like he answered "yes" and "no" x 1 each, but very difficult to discern if that was indeed the case.  He demonstrates no active movement Lt or Rt side, and mod edema Lt hand.  Disposition changed to SNF.  Will continue to follow.  If no significant improvement next session, will downgrade goals and treatment frequency.   Follow Up Recommendations  SNF    Equipment Recommendations  None recommended by OT    Recommendations for Other Services      Precautions / Restrictions Precautions Precautions: Fall Precaution Comments: left hemiplegia; NG tube Restrictions Weight Bearing Restrictions: No       Mobility Bed Mobility Overal bed mobility: Needs Assistance Bed Mobility: Sidelying to Sit;Supine to Sit;Rolling Rolling: Total assist;+2 for physical assistance Sidelying to sit: Total assist;+2 for physical assistance;+2 for safety/equipment Supine to sit: Total assist;+2 for physical assistance;+2 for safety/equipment;HOB elevated     General bed mobility comments: Total A to bring LEs to EOB and elevate  trunk; assist to bring LEs into bed and lower trunk to return to sidelying.   Transfers Overall transfer level:  (NA)               General transfer comment: unable to safely attempt     Balance Overall balance assessment: Needs assistance Sitting-balance support: Feet supported;No upper extremity supported Sitting balance-Leahy Scale: Zero Sitting balance - Comments: Requries total A sitting EOB. Sat EOB ~15-20 mins.                           ADL Overall ADL's : Needs assistance/impaired     Grooming: Wash/dry hands;Wash/dry face;Total assistance;Sitting Grooming Details (indicate cue type and reason): total hand over hand assist                                       Vision                     Perception     Praxis      Cognition   Behavior During Therapy: Flat affect Overall Cognitive Status: Difficult to assess     Current Attention Level: Focused            General Comments: Able to gaze to left during session with verbal cues. Some appropriate responses to questions asked, "yes" and grunting noises. Sticks tongue out to command. Not able to follow any motor commands.    Extremity/Trunk Assessment               Exercises  Other Exercises Other Exercises: PROM bil. UEs WFL with good scapular glide.  Pt with associated reactions Lt UE with yawn    Shoulder Instructions       General Comments      Pertinent Vitals/ Pain       Pain Assessment: No/denies pain Faces Pain Scale: No hurt  Home Living                                          Prior Functioning/Environment              Frequency  Min 3X/week        Progress Toward Goals  OT Goals(current goals can now be found in the care plan section)  Progress towards OT goals: Not progressing toward goals - comment (If pt does not show improvement next session, goals need to )     Plan Discharge plan needs to be updated     Co-evaluation    PT/OT/SLP Co-Evaluation/Treatment: Yes Reason for Co-Treatment: Complexity of the patient's impairments (multi-system involvement);For patient/therapist safety;Necessary to address cognition/behavior during functional activity PT goals addressed during session: Mobility/safety with mobility;Balance OT goals addressed during session: ADL's and self-care;Strengthening/ROM      End of Session Equipment Utilized During Treatment: Oxygen   Activity Tolerance Patient limited by lethargy   Patient Left in bed;with call bell/phone within reach;with nursing/sitter in room   Nurse Communication Mobility status        Time: 1432-1510 OT Time Calculation (min): 38 min  Charges: OT General Charges $OT Visit: 1 Procedure OT Treatments $Neuromuscular Re-education: 8-22 mins  Korynne Dols M 02/10/2016, 6:13 PM

## 2016-02-10 NOTE — Progress Notes (Signed)
This note also relates to the following rows which could not be included: Resp - Cannot attach notes to unvalidated device data  Pt taken off CPAP. RN aware. Pt tolerating room air well at this time.

## 2016-02-10 NOTE — Progress Notes (Signed)
STROKE TEAM PROGRESS NOTE   SUBJECTIVE (INTERVAL HISTORY) Mental status out of proportion to degree of hemorrhage and size of stroke. Check CD and EEG. Patient's wife at bedside. This was explained to her.    OBJECTIVE Temp:  [97.5 F (36.4 C)-101.4 F (38.6 C)] 97.5 F (36.4 C) (02/05 0800) Pulse Rate:  [77-97] 77 (02/05 1030) Cardiac Rhythm: Normal sinus rhythm (02/05 0800) Resp:  [13-22] 21 (02/05 1030) BP: (124-171)/(68-111) 125/77 (02/05 1030) SpO2:  [96 %-100 %] 100 % (02/05 1030) FiO2 (%):  [21 %] 21 % (02/05 0400) Weight:  [176 kg (388 lb)] 176 kg (388 lb) (02/04 1500)  CBC:  Recent Labs Lab 02/03/16 1542  02/09/16 0608 02/10/16 0242  WBC 9.7  < > 12.6* 12.7*  NEUTROABS 7.1  --   --   --   HGB 13.7  < > 10.9* 10.7*  HCT 42.8  < > 34.3* 33.9*  MCV 85.4  < > 85.8 86.3  PLT 272  < > 220 247  < > = values in this interval not displayed.  Basic Metabolic Panel:   Recent Labs Lab 02/09/16 0608 02/10/16 0242  NA 138 140  K 4.4 4.6  CL 107 108  CO2 25 25  GLUCOSE 163* 165*  BUN 30* 30*  CREATININE 1.70* 1.87*  CALCIUM 8.4* 8.3*    Lipid Panel:     Component Value Date/Time   CHOL 153 02/04/2016 0742   TRIG 174 (H) 02/04/2016 0742   HDL 40 (L) 02/04/2016 0742   CHOLHDL 3.8 02/04/2016 0742   VLDL 35 02/04/2016 0742   LDLCALC 78 02/04/2016 0742   HgbA1c:  Lab Results  Component Value Date   HGBA1C 7.8 (H) 02/06/2016   Urine Drug Screen:     Component Value Date/Time   LABOPIA POSITIVE (A) 02/04/2016 0530   COCAINSCRNUR NONE DETECTED 02/04/2016 0530   LABBENZ NONE DETECTED 02/04/2016 0530   AMPHETMU NONE DETECTED 02/04/2016 0530   THCU POSITIVE (A) 02/04/2016 0530   LABBARB NONE DETECTED 02/04/2016 0530      IMAGING  Ct Head Wo Contrast 02/06/2016 Evolving RIGHT thalamic hemorrhage. Unchanged mild obstructive hydrocephalus. Chronic changes include old LEFT thalamus lacunar infarct and moderate to severe chronic small vessel ischemic disease  better characterized on recent MRI.   02/04/2016 Evolving 11 x 30 mm RIGHT caudal thalamic hematoma resulting in mild obstructive hydrocephalus. Severe white matter changes most compatible with chronic hypertensive encephalopathy, recommend MRI of the head on nonemergent basis for further characterization.   02/03/2016 Interval development of intraparenchymal hemorrhage seen in the right thalamus with surrounding white matter edema. Mild dilatation of lateral ventricles is noted.   [name] 02/06/2016 IMPRESSION: 1. Bilateral acute cerebral watershed infarcts, progressed from the recent MRI. 2. Punctate acute infarct in the right superior cerebellar peduncle. 3. Unchanged right thalamic hemorrhage with surrounding edema and mild obstructive hydrocephalus. 4. Extensive chronic small vessel ischemic disease.    Mri and Mra Brain Wo Contrast 02/05/2016 IMPRESSION: MRI HEAD: Evolving RIGHT thalamic hematoma. Effaced 3rd ventricle with mild acute hydrocephalus. Bilateral sub centimeter acute watershed territory infarcts. Moderate to severe white matter changes consistent with interstitial edema and chronic small vessel ischemic disease. MRA HEAD: No acute vascular process. Mildly ectatic basilar artery.   2D echo  - Left ventricle: The cavity size was normal. Wall thickness was increased in a pattern of moderate LVH. Systolic function was normal. The estimated ejection fraction was in the range of 55% to 60%. Wall  motion was normal; there were no regional wall motion abnormalities. Doppler parameters are consistent with restrictive physiology, indicative of decreased left ventricular diastolic compliance and/or increased left atrial pressure. Impressions:   Extremely limited; definity used; normal LV systolic function;  moderate LVH; restrictive filling.  CUS - Bilateral 1-39% ICA stenosis, antegrade vertebral flow. Difficult exam due to patient cooperation, snoring and body habitus.  LE  venous doppler - No evidence of deep vein thrombosis or baker's cysts bilaterally.  TCD - no vasospasm   PHYSICAL EXAM General - morbidly obese middle-aged African-American male in no apparent distress, Drowsy sleepy.  Cardiovascular - Regular rate and rhythm. Respiratory - snoring breathing but able to protect airway Abdomen:  Soft, obese, ND normal bowel sounds Extremities:  No C/C/E  NEURO Drowsy sleepy, and briefly open eyes but not on command. No vocalization  PERRL, eyes middle position, positive corneal, left facial droop. Weak spontaneous cough  Motor/Sensory:  With painful stimulation RUE and RLE purposeful movement, but LUE and LLE mild withdrawal.   Coordination and gait not tested due to LOC   ASSESSMENT/PLAN Adam Vincent is a 46 y.o. male with history of hypertension and hyperlipidemia presenting with headache and left-sided weakness. CT showed a right thalamic intracerebral hemorrhage in setting of elevated blood pressure. He was started on IV Cardene admitted to the neuro ICU.  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   Resultant  left hemiparesis and left facial droop, AMS  CT head x 2 evolving right thalamic ICH with mild obstructive hydrocephalus.   MRI - evolving RIGHT thalamic hematoma. Mild acute hydrocephalus. Bilateral sub centimeter acute watershed territory infarcts.   MRA  unremarkable   MR repeat - increase bilateral watershed territory infarcts.  Carotid Doppler  unremarkable   2D Echo  EF 55-60%   Check EEG  Cancel TCD  LE venous doppler - no DVT  LDL 78  HgbA1c 7.9  SCDs for VTE prophylaxis Diet NPO time specified  aspirin 81 mg daily prior to admission, now on No antithrombotic secondary to hemorrhage  Ongoing aggressive stroke risk factor management  Therapy recommendations:  pending   Disposition:  Pending  Bilateral watershed territory infarcts - may be due to strict BP control and  hypoxia/hypercapnia from OSA.  May explain mental status changes  May be related to strict BP control and hypoxia/hypercarbia from OSA  BP goal < 160 on 2/2  TCD no sign of vasospasm  Hypertensive emergency  Blood pressure 211/115 on arrival in setting of neurologic symptoms   Started on Cardene in the ED for control, changed to Cleviprex  off labetalol drip  SBP goal < 160 , no longer on Cleviprex  on po meds with labetalol, amlodipine and hydralazine  Increase SBP goal to < 180  obstructive sleep apnea  hypoxia/hypercapnia from OSA  On CPAP at night, off today  CCM following respiratory status closely, will intubate if declines. Would likely need trach if intubated  Hyperlipidemia  Home meds:  No statin  LDL 78  Added Lipitor 10  Continue statin on discharge  Diabetes  Hyperglycemia   Check CBGs every 4 hours - currently NPO  SSI  HgbA1c 7.9 , goal < 7.0  Improved glucoses 157, 161   Lantus 20 units daily at bedtime   Continue to monitor  Dysphagia   NG tube feeding  105 cc/h during the day and off at night for CPAP  CKD, stage III  Creatinine 2.14->2.22-> 1.93->1.87  Likely due to uncontrolled HTN and DM  On IV fluid  Other Stroke Risk Factors  Marijuana user, 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   Morbid Obesity, Body mass index is 51.19 kg/m., recommend weight loss, diet and exercise as appropriate   OSA, uses cPAP at home  Other Active Problems  Will start Provigil today at 100mg  twice a day  Leukocytosis 14.4->12.7  Right cheek swelling - intact oral mucosa - concerning for dental abscess; no fever currently.  Will monitor WBCs.  May need to treat empirically  Hospital day # [place] for Pager information 02/10/2016 11:18 AM  I have personally examined this patient, reviewed notes, independently viewed imaging studies, participated in medical decision making and plan of care.ROS completed by me  personally and pertinent positives fully documented  I have made any additions or clarifications directly to the above note. Agree with note above.  Patient mental status seems slightly out of proportion to his brain hemorrhage and edema. Check EEG for seizure activity. Check carotid Dopplers for extracranial stenosis. Discussed with patient's wife at the bedside and dr Lamonte Sakai  and answered questions. This patient is critically ill and at significant risk of neurological worsening, death and care requires constant monitoring of vital signs, hemodynamics,respiratory and cardiac monitoring, extensive review of multiple databases, frequent neurological assessment, discussion with family, other specialists and medical decision making of high complexity.I have made any additions or clarifications directly to the above note.This critical care time does not reflect procedure time, or teaching time or supervisory time of PA/NP/Med Resident etc but could involve care discussion time.  I spent 30 minutes of neurocritical care time  in the care of  this patient.      Antony Contras, MD Medical Director [facility] Pager: [phone] 02/10/2016 4:21 PM  To contact Stroke Continuity provider, please refer to [email or web address] After hours, contact General Neurology

## 2016-02-10 NOTE — Progress Notes (Signed)
SLP Cancellation Note  Patient Details Name: Adam Vincent MRN: [id number] DOB: [birth year]   Cancelled treatment:       Reason Eval/Treat Not Completed: Fatigue/lethargy limiting ability to participate   Germain Osgood 02/10/2016, 1:08 PM  Germain Osgood, M.A. CCC-SLP [phone]

## 2016-02-10 NOTE — Progress Notes (Signed)
Physical Therapy Treatment Patient Details Name: Adam Vincent MRN: [id number] DOB: Mar 26, 1970 Today's Date: 02/10/2016    History of Present Illness This 46 y.o. male admitted with generalized weakness and cough. Head CT-Evolving RIGHT thalamic hemorrhage. MRI-Evolving RIGHT thalamic hematoma. Effaced 3rd ventricle with mild acute hydrocephalus. Bilateral sub centimeter acute watershed territory infarcts. PMH includes:  CAD with multiple stents, COPD on 2L 02, HTN, CKDIII, parosysmal A-fib, h/o CVA, syncope, PNA, h/o substance abuse, conversion, anxiety disorder, cognitive impairment.     PT Comments    Patient tolerated sitting EOB ~15 minutes with assist of 2. Seems to be putting forth some effort today but fatigues very quickly. Edema noted left hand. Some extensor spasticity noted in LLE during PROM in supine. Able to follow 1 step commands inconsistently. Frequency decreased due to decreased level of arousal. Will continue to follow.   Follow Up Recommendations  CIR;Supervision/Assistance - 24 hour     Equipment Recommendations  Other (comment) (TBA)    Recommendations for Other Services       Precautions / Restrictions Precautions Precautions: Fall Precaution Comments: left hemiplegia; NG tube Restrictions Weight Bearing Restrictions: No    Mobility  Bed Mobility Overal bed mobility: Needs Assistance Bed Mobility: Sidelying to Sit;Supine to Sit;Rolling Rolling: Total assist;+2 for physical assistance Sidelying to sit: Total assist;+2 for physical assistance;+2 for safety/equipment Supine to sit: Total assist;+2 for physical assistance;+2 for safety/equipment;HOB elevated     General bed mobility comments: Total A to bring LEs to EOB and elevate trunk; assist to bring LEs into bed and lower trunk to return to sidelying.   Transfers Overall transfer level:  (NA)                  Ambulation/Gait                 Stairs            Wheelchair  Mobility    Modified Rankin (Stroke Patients Only) Modified Rankin (Stroke Patients Only) Pre-Morbid Rankin Score: No symptoms Modified Rankin: Severe disability     Balance Overall balance assessment: Needs assistance Sitting-balance support: Feet supported;No upper extremity supported Sitting balance-Leahy Scale: Zero Sitting balance - Comments: Requries total A sitting EOB. Sat EOB ~15-20 mins.                            Cognition Arousal/Alertness: Lethargic Behavior During Therapy: Flat affect Overall Cognitive Status: Difficult to assess     Current Attention Level: Focused           General Comments: Able to gaze to left during session with verbal cues. Some appropriate responses to questions asked, "yes" and grunting noises. Sticks tongue out to command. Not able to follow any motor commands.    Exercises      General Comments General comments (skin integrity, edema, etc.): Pt with extensor spasticity LLE during PROM in supine which dissipated once sitting EOB.       Pertinent Vitals/Pain Pain Assessment: Faces Faces Pain Scale: No hurt    Home Living                      Prior Function            PT Goals (current goals can now be found in the care plan section) Progress towards PT goals: Not progressing toward goals - comment (secondary to lethargy and inability to follow commands consistently)  Frequency    Min 2X/week      PT Plan Frequency needs to be updated    Co-evaluation PT/OT/SLP Co-Evaluation/Treatment: Yes Reason for Co-Treatment: Complexity of the patient's impairments (multi-system involvement);Necessary to address cognition/behavior during functional activity;For patient/therapist safety;To address functional/ADL transfers PT goals addressed during session: Mobility/safety with mobility;Balance       End of Session   Activity Tolerance: Patient limited by lethargy Patient left: in bed;with call  bell/phone within reach;with SCD's reapplied     Time: 9292-4462 PT Time Calculation (min) (ACUTE ONLY): 35 min  Charges:  $Therapeutic Activity: 8-22 mins                    G Codes:      Adam Vincent 02/10/2016, 3:38 PM Wray Kearns, [place], DPT [phone]

## 2016-02-10 NOTE — Progress Notes (Signed)
EEG completed; results pending.    

## 2016-02-10 NOTE — Progress Notes (Signed)
RT verified that patient was not receiving tube feed at this time. RT placed patient on CPAP and patient is resting comfortably with no respiratory distress noted at this time. RT will continue to monitor as needed.

## 2016-02-10 NOTE — Procedures (Signed)
ELECTROENCEPHALOGRAM REPORT  Date of Study: 02/10/2016  Patient's Name: Adam Vincent MRN: [id number] Date of Birth: 09-10-1970  Referring Provider: Ivin Booty L. Miachel Roux, NP  Clinical History: 46 year old man with right thalamic hemorrhage  Medications: acetaminophen (TYLENOL)   amLODipine (NORVASC) tablet 10 mg  atorvastatin (LIPITOR) tablet 10 mg  chlorhexidine (PERIDEX) 0.12 % solution 15 mL  clevidipine (CLEVIPREX) infusion 0.5 mg/mL  hydrALAZINE (APRESOLINE) tablet 50 mg  insulin aspart  Labetalol metFORMIN (GLUCOPHAGE) tablet 500 mg  modafinil (PROVIGIL) tablet 100 mg   Technical Summary: A multichannel digital EEG recording measured by the international 10-20 system with electrodes applied with paste and impedances below 5000 ohms performed as portable with EKG monitoring in an unresponsive patient.  Hyperventilation and photic stimulation were not performed.  The digital EEG was referentially recorded, reformatted, and digitally filtered in a variety of bipolar and referential montages for optimal display.   Description: The patient is unresponsive during the recording.  The background is symmetric with diffuse 4-5 Hz theta and 2-3 Hz delta slowing admixed with nonsustained faster frequencies up to 8Hz  in the posterior leads.  There were no epileptiform discharges or electrographic seizures seen.    EKG lead was unremarkable.  Impression: This EEG is abnormal due to diffuse slowing of the waking background.  Clinical Correlation of the above findings indicates diffuse cerebral dysfunction that is non-specific in etiology and can be seen with hypoxic/ischemic injury, toxic/metabolic encephalopathies, neurodegenerative disorders, or medication effect.  The absence of epileptiform discharges does not rule out a clinical diagnosis of epilepsy.  Clinical correlation is advised.   Metta Clines, DO

## 2016-02-11 ENCOUNTER — Inpatient Hospital Stay (HOSPITAL_COMMUNITY): Payer: Medicaid Other

## 2016-02-11 DIAGNOSIS — R400 Somnolence: Secondary | ICD-10-CM

## 2016-02-11 DIAGNOSIS — D62 Acute posthemorrhagic anemia: Secondary | ICD-10-CM

## 2016-02-11 DIAGNOSIS — N183 Chronic kidney disease, stage 3 unspecified: Secondary | ICD-10-CM | POA: Diagnosis present

## 2016-02-11 DIAGNOSIS — R401 Stupor: Secondary | ICD-10-CM

## 2016-02-11 DIAGNOSIS — I639 Cerebral infarction, unspecified: Secondary | ICD-10-CM | POA: Diagnosis not present

## 2016-02-11 DIAGNOSIS — R404 Transient alteration of awareness: Secondary | ICD-10-CM

## 2016-02-11 DIAGNOSIS — R5383 Other fatigue: Secondary | ICD-10-CM | POA: Diagnosis not present

## 2016-02-11 DIAGNOSIS — R1310 Dysphagia, unspecified: Secondary | ICD-10-CM

## 2016-02-11 DIAGNOSIS — E119 Type 2 diabetes mellitus without complications: Secondary | ICD-10-CM

## 2016-02-11 DIAGNOSIS — E1169 Type 2 diabetes mellitus with other specified complication: Secondary | ICD-10-CM | POA: Diagnosis present

## 2016-02-11 DIAGNOSIS — R509 Fever, unspecified: Secondary | ICD-10-CM | POA: Diagnosis not present

## 2016-02-11 DIAGNOSIS — I10 Essential (primary) hypertension: Secondary | ICD-10-CM | POA: Diagnosis present

## 2016-02-11 DIAGNOSIS — E669 Obesity, unspecified: Secondary | ICD-10-CM

## 2016-02-11 DIAGNOSIS — I6350 Cerebral infarction due to unspecified occlusion or stenosis of unspecified cerebral artery: Secondary | ICD-10-CM

## 2016-02-11 DIAGNOSIS — G919 Hydrocephalus, unspecified: Secondary | ICD-10-CM

## 2016-02-11 DIAGNOSIS — F1210 Cannabis abuse, uncomplicated: Secondary | ICD-10-CM | POA: Diagnosis present

## 2016-02-11 DIAGNOSIS — G911 Obstructive hydrocephalus: Secondary | ICD-10-CM

## 2016-02-11 LAB — GLUCOSE, CAPILLARY
GLUCOSE-CAPILLARY: 183 mg/dL — AB (ref 65–99)
GLUCOSE-CAPILLARY: 240 mg/dL — AB (ref 65–99)
GLUCOSE-CAPILLARY: 239 mg/dL — AB (ref 65–99)
GLUCOSE-CAPILLARY: 226 mg/dL — AB (ref 65–99)
Glucose-Capillary: 149 mg/dL — ABNORMAL HIGH (ref 65–99)
Glucose-Capillary: 155 mg/dL — ABNORMAL HIGH (ref 65–99)

## 2016-02-11 MED ORDER — METHYLPHENIDATE HCL 5 MG PO TABS
5.0000 mg | ORAL_TABLET | Freq: Two times a day (BID) | ORAL | Status: DC
  Administered 2016-02-11 – 2016-02-12 (×2): 5 mg via ORAL
  Filled 2016-02-11 (×2): qty 1

## 2016-02-11 MED ORDER — METHYLPHENIDATE HCL 5 MG PO TABS: 5.0000 mg | ORAL_TABLET | Freq: Every day | ORAL | Status: DC

## 2016-02-11 NOTE — Progress Notes (Signed)
STROKE TEAM PROGRESS NOTE   SUBJECTIVE (INTERVAL HISTORY) Patient remains obtunded and difficult to arouse. He did receive Provigil this morning but it did not make him more awake. Repeat CT scan of the head was obtained today which showed improvement in hydrocephalus and cytotoxic edema without any worsening. EEG yesterday had shown mild generalized slowing without seizure activity   OBJECTIVE Temp:  [99.2 F (37.3 C)-100.6 F (38.1 C)] 99.8 F (37.7 C) (02/06 0400) Pulse Rate:  [77-105] 93 (02/06 0810) Cardiac Rhythm: Normal sinus rhythm (02/06 0000) Resp:  [15-31] 21 (02/06 0810) BP: (121-174)/(57-97) 171/89 (02/06 0810) SpO2:  [96 %-100 %] 98 % (02/06 0810) FiO2 (%):  [21 %] 21 % (02/06 0000) Weight:  [175.1 kg (386 lb)] 175.1 kg (386 lb) (02/06 0423)  CBC:   Recent Labs Lab 02/09/16 0608 02/10/16 0242  WBC 12.6* 12.7*  HGB 10.9* 10.7*  HCT 34.3* 33.9*  MCV 85.8 86.3  PLT 220 664    Basic Metabolic Panel:   Recent Labs Lab 02/09/16 0608 02/10/16 0242  NA 138 140  K 4.4 4.6  CL 107 108  CO2 25 25  GLUCOSE 163* 165*  BUN 30* 30*  CREATININE 1.70* 1.87*  CALCIUM 8.4* 8.3*    Lipid Panel:     Component Value Date/Time   CHOL 153 02/04/2016 0742   TRIG 174 (H) 02/04/2016 0742   HDL 40 (L) 02/04/2016 0742   CHOLHDL 3.8 02/04/2016 0742   VLDL 35 02/04/2016 0742   LDLCALC 78 02/04/2016 0742   HgbA1c:  Lab Results  Component Value Date   HGBA1C 7.8 (H) 02/06/2016   Urine Drug Screen:     Component Value Date/Time   LABOPIA POSITIVE (A) 02/04/2016 0530   COCAINSCRNUR NONE DETECTED 02/04/2016 0530   LABBENZ NONE DETECTED 02/04/2016 0530   AMPHETMU NONE DETECTED 02/04/2016 0530   THCU POSITIVE (A) 02/04/2016 0530   LABBARB NONE DETECTED 02/04/2016 0530      IMAGING  Ct Head Wo Contrast 02/06/2016 Evolving RIGHT thalamic hemorrhage. Unchanged mild obstructive hydrocephalus. Chronic changes include old LEFT thalamus lacunar infarct and moderate to  severe chronic small vessel ischemic disease better characterized on recent MRI.   02/04/2016 Evolving 11 x 30 mm RIGHT caudal thalamic hematoma resulting in mild obstructive hydrocephalus. Severe white matter changes most compatible with chronic hypertensive encephalopathy, recommend MRI of the head on nonemergent basis for further characterization.   02/03/2016 Interval development of intraparenchymal hemorrhage seen in the right thalamus with surrounding white matter edema. Mild dilatation of lateral ventricles is noted.   [name] 02/06/2016 IMPRESSION: 1. Bilateral acute cerebral watershed infarcts, progressed from the recent MRI. 2. Punctate acute infarct in the right superior cerebellar peduncle. 3. Unchanged right thalamic hemorrhage with surrounding edema and mild obstructive hydrocephalus. 4. Extensive chronic small vessel ischemic disease.    Mri and Mra Brain Wo Contrast 02/05/2016 IMPRESSION: MRI HEAD: Evolving RIGHT thalamic hematoma. Effaced 3rd ventricle with mild acute hydrocephalus. Bilateral sub centimeter acute watershed territory infarcts. Moderate to severe white matter changes consistent with interstitial edema and chronic small vessel ischemic disease. MRA HEAD: No acute vascular process. Mildly ectatic basilar artery.   2D echo  - Left ventricle: The cavity size was normal. Wall thickness was increased in a pattern of moderate LVH. Systolic function was normal. The estimated ejection fraction was in the range of 55% to 60%. Wall motion was normal; there were no regional wall motion abnormalities. Doppler parameters are consistent with restrictive physiology, indicative  of decreased left ventricular diastolic compliance and/or increased left atrial pressure. Impressions:   Extremely limited; definity used; normal LV systolic function;  moderate LVH; restrictive filling.  CUS - Bilateral 1-39% ICA stenosis, antegrade vertebral flow. Difficult exam due to patient  cooperation, snoring and body habitus.  LE venous doppler - No evidence of deep vein thrombosis or baker's cysts bilaterally.  TCD - no vasospasm   PHYSICAL EXAM General - morbidly obese middle-aged African-American male in no apparent distress, Drowsy sleepy.  Cardiovascular - Regular rate and rhythm. Respiratory - snoring breathing but able to protect airway Abdomen:  Soft, obese, ND normal bowel sounds Extremities:  No C/C/E  NEURO Stuporose sleepy, and briefly open eyes but not on command. No vocalization  PERRL, eyes down gaze deviation, positive corneal, left facial droop. Weak spontaneous cough  Motor/Sensory:  With painful stimulation RUE and RLE purposeful movement, but LUE and LLE mild withdrawal.   Coordination and gait not tested due to LOC   ASSESSMENT/PLAN [name] is a 46 y.o. male with history of hypertension and hyperlipidemia presenting with headache and left-sided weakness. CT showed a right thalamic intracerebral hemorrhage in setting of elevated blood pressure. He was started on IV Cardene admitted to the neuro ICU.  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   Resultant  left hemiparesis and left facial droop, AMS  CT head x 2 evolving right thalamic ICH with mild obstructive hydrocephalus.   MRI - evolving RIGHT thalamic hematoma. Mild acute hydrocephalus. Bilateral sub centimeter acute watershed territory infarcts.   MRA  unremarkable   MR repeat - increase bilateral watershed territory infarcts.  Carotid Doppler  unremarkable   2D Echo  EF 55-60%   EEG abnormal due to diffuse slowing of the waking background  LE venous doppler - no DVT  LDL 78  HgbA1c 7.9  SCDs for VTE prophylaxis Diet NPO time specified  aspirin 81 mg daily prior to admission, now on No antithrombotic secondary to hemorrhage  Ongoing aggressive stroke risk factor management  Therapy recommendations:  pending   Disposition:   Pending  Bilateral watershed territory infarcts - may be due to strict BP control and hypoxia/hypercapnia from OSA.  May explain mental status changes  May be related to strict BP control and hypoxia/hypercarbia from OSA  BP goal < 160 on 2/2  TCD no sign of vasospasm  Hypertensive emergency  Blood pressure 211/115 on arrival in setting of neurologic symptoms   Started on Cardene in the ED for control, changed to Cleviprex  off labetalol drip  SBP goal < 160 , no longer on Cleviprex  on po meds with labetalol, amlodipine and hydralazine  Increase SBP goal to < 180  obstructive sleep apnea  hypoxia/hypercapnia from OSA  On CPAP at night, off today  CCM following respiratory status closely, will intubate if declines. Would likely need trach if intubated  Hyperlipidemia  Home meds:  No statin  LDL 78  Added Lipitor 10  Continue statin on discharge  Diabetes  Hyperglycemia   Check CBGs every 4 hours - currently NPO  SSI  HgbA1c 7.9 , goal < 7.0  Improved glucoses 157, 161   Lantus 20 units daily at bedtime   Continue to monitor  Dysphagia   NG tube feeding  105 cc/h during the day and off at night for CPAP  CKD, stage III  Creatinine 2.14->2.22-> 1.93->1.87   Likely due to uncontrolled HTN and DM  On IV fluid  Other Stroke Risk Factors  Marijuana user, 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   Morbid Obesity, Body mass index is 50.93 kg/m., recommend weight loss, diet and exercise as appropriate   OSA, uses cPAP at home  Other Active Problems  Will discontinue Provigil today  and switch to regular 5 mg at 6 AM and 12 known instead Leukocytosis 14.4->12.7  Right cheek swelling - intact oral mucosa - concerning for dental abscess; no fever currently.  Will monitor WBCs.  May need to treat empirically  Hospital day # [place] for Pager information 02/11/2016 9:01 AM  I have personally examined this patient,  reviewed notes, independently viewed imaging studies, participated in medical decision making and plan of care.ROS completed by me personally and pertinent positives fully documented  I have made any additions or clarifications directly to the above note. Agree with note above.  Patient mental status seems slightly out of proportion to his brain hemorrhage and edema. EEG shows no seizure activity We will switch Provigil to Ritalin to see if this can make him more alert.  Discussed with  dr Lamonte Sakai  and answered questions. This patient is critically ill and at significant risk of neurological worsening, death and care requires constant monitoring of vital signs, hemodynamics,respiratory and cardiac monitoring, extensive review of multiple databases, frequent neurological assessment, discussion with family, other specialists and medical decision making of high complexity.I have made any additions or clarifications directly to the above note.This critical care time does not reflect procedure time, or teaching time or supervisory time of PA/NP/Med Resident etc but could involve care discussion time.  I spent 32 minutes of neurocritical care time  in the care of  this patient.      Antony Contras, MD Medical Director [place] Pager: [phone] 02/11/2016 9:01 AM  To contact Stroke Continuity provider, please refer to [email or web address] After hours, contact General Neurology

## 2016-02-11 NOTE — Evaluation (Signed)
Speech Language Pathology Evaluation Patient Details Name: Adam Vincent MRN: [id number] DOB: April 16, 1970 Today's Date: 02/11/2016 Time: 2409-7353 SLP Time Calculation (min) (ACUTE ONLY): 15 min  Problem List:  Patient Active Problem List   Diagnosis Date Noted  . Hydrocephalus   . Cerebrovascular accident (CVA) ([place])   . Morbid obesity ([place])   . Marijuana abuse   . Benign essential HTN   . Diabetes mellitus type 2 in obese ([place])   . Fever   . Acute blood loss anemia   . Dysphagia   . Stage 3 chronic kidney disease   . Lethargy   . Altered mental state   . Thalamic hemorrhage with stroke ([place])   . Uncontrolled hypertension   . ICH (intracerebral hemorrhage) ([place]) 02/03/2016   Past Medical History:  Past Medical History:  Diagnosis Date  . Diabetes mellitus   . Hypertension    Past Surgical History:  Past Surgical History:  Procedure Laterality Date  . ABSCESS DRAINAGE     Left leg   HPI:  This 46 y.o. male admitted with generalized weakness and cough. Head CT-Evolving RIGHT thalamic hemorrhage. MRI-Evolving RIGHT thalamic hematoma. Effaced 3rd ventricle with mild acute hydrocephalus. Bilateral sub centimeter acute watershed territory infarcts. PMH includes:  CAD with multiple stents, COPD on 2L 02, HTN, CKDIII, parosysmal A-fib, h/o CVA, syncope, PNA, h/o substance abuse, conversion, anxiety disorder, cognitive impairment.    Assessment / Plan / Recommendation Clinical Impression  Pt's decreased level of alertness impacts all higher levels of cognitive-linguistic function. During assessment today he did not open his eyes or follow commands. Spontaneous vocalizations were noted intermittently, but no purposeful verbalizations. Pt will need additional SLP f/u with focus on differential diagnosis of cognitive abilities as his alertness improves.    SLP Assessment  Patient needs continued Speech Lanaguage Pathology Services    Follow Up Recommendations  Skilled Nursing  facility    Frequency and Duration min 2x/week  2 weeks      SLP Evaluation Cognition  Overall Cognitive Status: Impaired/Different from baseline Arousal/Alertness: Lethargic Orientation Level: Other (comment) (cannot assess, non-interactive) Attention: Focused Focused Attention: Impaired Focused Attention Impairment: Verbal basic;Functional basic Comments: decreased level of alertness impacts all higher levels of cognitive function       Comprehension  Auditory Comprehension Overall Auditory Comprehension: Impaired Yes/No Questions: Impaired Basic Biographical Questions: 0-25% accurate Commands: Impaired One Step Basic Commands: 0-24% accurate    Expression Expression Primary Mode of Expression: Other (comment) (UTA) Verbal Expression Overall Verbal Expression: Other (comment) (UTA)   Oral / Motor  Oral Motor/Sensory Function Overall Oral Motor/Sensory Function: Other (comment) (UTA) Motor Speech Overall Motor Speech: Other (comment) (UTA)   GO                    Germain Osgood 02/11/2016, 2:31 PM  Germain Osgood, M.A. CCC-SLP [phone]

## 2016-02-11 NOTE — Progress Notes (Signed)
Pt taken off CPAP to room air. Pt tolerating well at this time. RN aware.

## 2016-02-11 NOTE — Progress Notes (Signed)
RT filled CPAP humidifier with sterile water and placed mask on patient. Patient is resting comfortably at this time with no respiratory distress noted. RT will monitor patient as needed.

## 2016-02-11 NOTE — Progress Notes (Signed)
D/c disposition remains SNF at this time due to abilities with therapy. I will follow at a distance. [phone]

## 2016-02-11 NOTE — Progress Notes (Signed)
Name: Adam Vincent MRN: [id number] DOB: 1970-02-26    ADMISSION DATE:  02/03/2016 CONSULTATION DATE:  02/11/2016  REFERRING MD :  Erlinda Hong,   CHIEF COMPLAINT:  Obstructive sleep apnea, stroke  HISTORY OF PRESENT ILLNESS:  46 year old unfortunate obese man with hypertension admitted 1/29 with sudden onset headache and found to have right thalamic bleed with surrounding edema. Blood pressure was controlled with Cardene. He initially improved somewhat and was able to get out of bed to chair but then mental status worsened again and MRI showed bilateral cerebral watershed infarcts. He was placed on clevidipine for blood pressure control, NG tube was placed. Overnight, apneas were noted with loud snoring and he was placed on CPAP without complication. [facility] M consulted to assist with OSA management and respiratory issues Patient is somnolent with intermittent eye opening and history is obtained from wife   SIGNIFICANT EVENTS    STUDIES:  Echo >> normal LV function, moderate LVH, diastolic dysfunction present  MRI brain 2/1>> bilateral cerebral watershed infarcts, small infarct and right cerebellum, unchanged thalamic bleed and edema compared to 1/31  SUBJECTIVE:  Clinically unchanged Tolerating CPAP qhs  VITAL SIGNS: Temp:  [99.2 F (37.3 C)-100.6 F (38.1 C)] 100.5 F (38.1 C) (02/06 1200) Pulse Rate:  [81-105] 86 (02/06 1400) Resp:  [15-31] 26 (02/06 1400) BP: (121-174)/(57-94) 142/80 (02/06 1400) SpO2:  [96 %-100 %] 98 % (02/06 1400) FiO2 (%):  [21 %] 21 % (02/06 0000) Weight:  [175.1 kg (386 lb)] 175.1 kg (386 lb) (02/06 0423)  PHYSICAL EXAMINATION: Gen.sleeping, NAD ENT - no secretions, some occasional grunting, no snoring (off CPAP) Neck: no stridor Lungs: comfortable resp pattern, no wheeze or crackles Cardiovascular: regular no M  Abdomen: obese, soft and benign Musculoskeletal: no deformities Neuro: awake but dysphasic, some grunting that appears to be intentional, moves  ext    Recent Labs Lab 02/08/16 1342 02/09/16 0608 02/10/16 0242  NA 137 138 140  K 4.2 4.4 4.6  CL 106 107 108  CO2 24 25 25   BUN 27* 30* 30*  CREATININE 1.71* 1.70* 1.87*  GLUCOSE 242* 163* 165*    Recent Labs Lab 02/08/16 1342 02/09/16 0608 02/10/16 0242  HGB 11.6* 10.9* 10.7*  HCT 36.7* 34.3* 33.9*  WBC 14.4* 12.6* 12.7*  PLT 208 220 247   Ct Head Wo Contrast  Result Date: 02/11/2016 CLINICAL DATA:  Stroke.  Hydrocephalus. EXAM: CT HEAD WITHOUT CONTRAST TECHNIQUE: Contiguous axial images were obtained from the base of the skull through the vertex without intravenous contrast. COMPARISON:  MRI brain 02/06/2016 and CT head without contrast 02/06/2016 FINDINGS: Brain: There is continued expected evolution of the right thalamic hemorrhage. No new hemorrhage is present. Ventricular size is decreased seen with diminished mass effect on the third ventricle compatible with resolving hydrocephalus. No significant interventricular hemorrhage is evident. Watershed infarcts are not well differentiated from extensive bilateral subcortical white matter disease by CT. No definite new cortical infarct is present. Vascular: No hyperdense vessel or unexpected calcification. Skull: The calvarium is intact. Sinuses/Orbits: Progressive anterior right paranasal sinus disease present with opacification of the right maxillary sinus. The right frontal sinus demonstrates mild mucosal thickening. The globes and orbits are intact. IMPRESSION: 1. Resolving hydrocephalus with expected evolution of right thalamic hemorrhage and decreased mass effect on the third ventricle. 2. Diffuse periventricular and subcortical white matter hypoattenuation representing a combination of watershed territory infarcts and chronic microvascular ischemic change. These cannot be differentiated on the basis of CT. 3. No acute cortical  infarct. Electronically Signed   By: San Morelle [name]   On: 02/11/2016 10:06     ASSESSMENT / PLAN:  Intracerebral hemorrhage Bilateral acute watershed cerebral infarcts with suppressed MS - per neurology plans  Hypertensive emergency - BP controlled   OSA that is worse given his current MS - continue CPAP qhs and during the day when sleeping - still no evidence decompensation, appears to be protecting airway  Acute renal failure - follow BMP  Independent CC time 32 minutes  CC will sign off. Please call if he clinically changes or if we can assist.   Baltazar Apo, MD, PhD 02/11/2016, 2:50 PM [facility] [phone] or if no answer [phone]

## 2016-02-11 NOTE — Progress Notes (Signed)
Pt currently unable to work with therapies due to decline in mental status.  Will need skilled nursing facility placement should mental status not improve and pt able to participate in therapies.   Will alert CSW.  Will follow progress.   Reinaldo Raddle, RN, BSN  Trauma/Neuro ICU Case Manager [phone]

## 2016-02-11 NOTE — Progress Notes (Addendum)
Speech Language Pathology Treatment: Dysphagia  Patient Details Name: Adam Vincent MRN: [id number] DOB: 29-Jan-1970 Today's Date: 02/11/2016 Time: 7619-5093 SLP Time Calculation (min) (ACUTE ONLY): 8 min  Assessment / Plan / Recommendation Clinical Impression  Pt remains lethargic despite multimodal cueing from therapist. SLP provided oral care with no active oral movements observed. Pt had minimal labial movement but no parting when thermal/tactile stimulation was provided. His decreased level of alertness remains a barrier to safe PO intake. Will continue to follow.    HPI HPI: This 46 y.o. male admitted with generalized weakness and cough. Head CT-Evolving RIGHT thalamic hemorrhage. MRI-Evolving RIGHT thalamic hematoma. Effaced 3rd ventricle with mild acute hydrocephalus. Bilateral sub centimeter acute watershed territory infarcts. PMH includes:  CAD with multiple stents, COPD on 2L 02, HTN, CKDIII, parosysmal A-fib, h/o CVA, syncope, PNA, h/o substance abuse, conversion, anxiety disorder, cognitive impairment.       SLP Plan  Continue with current plan of care     Recommendations  Diet recommendations: NPO Medication Administration: Via alternative means                Oral Care Recommendations: Oral care QID Follow up Recommendations: Skilled Nursing facility Plan: Continue with current plan of care       GO                Germain Osgood 02/11/2016, 2:25 PM  Germain Osgood, M.A. CCC-SLP [phone]

## 2016-02-12 LAB — GLUCOSE, CAPILLARY
GLUCOSE-CAPILLARY: 227 mg/dL — AB (ref 65–99)
GLUCOSE-CAPILLARY: 142 mg/dL — AB (ref 65–99)
Glucose-Capillary: 157 mg/dL — ABNORMAL HIGH (ref 65–99)
Glucose-Capillary: 144 mg/dL — ABNORMAL HIGH (ref 65–99)
Glucose-Capillary: 245 mg/dL — ABNORMAL HIGH (ref 65–99)
Glucose-Capillary: 201 mg/dL — ABNORMAL HIGH (ref 65–99)

## 2016-02-12 MED ORDER — METHYLPHENIDATE HCL 5 MG PO TABS
10.0000 mg | ORAL_TABLET | Freq: Two times a day (BID) | ORAL | Status: DC
  Administered 2016-02-12 – 2016-02-26 (×19): 10 mg via ORAL
  Filled 2016-02-12 (×21): qty 2

## 2016-02-12 NOTE — Progress Notes (Signed)
STROKE TEAM PROGRESS NOTE   SUBJECTIVE (INTERVAL HISTORY) Patient remains obtunded and can be aroused . He did receive Ritalin this morning and it did   make him more awake.  Wife is at bedside today  OBJECTIVE Temp:  [98.5 F (36.9 C)-101 F (38.3 C)] 100.7 F (38.2 C) (02/07 1200) Pulse Rate:  [74-109] 89 (02/07 1100) Cardiac Rhythm: Normal sinus rhythm (02/07 0700) Resp:  [17-31] 28 (02/07 1100) BP: (86-163)/(44-83) 93/44 (02/07 1100) SpO2:  [96 %-100 %] 96 % (02/07 1100) Weight:  [383 lb (173.7 kg)] 383 lb (173.7 kg) (02/07 0400)  CBC:   Recent Labs Lab 02/09/16 0608 02/10/16 0242  WBC 12.6* 12.7*  HGB 10.9* 10.7*  HCT 34.3* 33.9*  MCV 85.8 86.3  PLT 220 993    Basic Metabolic Panel:   Recent Labs Lab 02/09/16 0608 02/10/16 0242  NA 138 140  K 4.4 4.6  CL 107 108  CO2 25 25  GLUCOSE 163* 165*  BUN 30* 30*  CREATININE 1.70* 1.87*  CALCIUM 8.4* 8.3*    Lipid Panel:     Component Value Date/Time   CHOL 153 02/04/2016 0742   TRIG 174 (H) 02/04/2016 0742   HDL 40 (L) 02/04/2016 0742   CHOLHDL 3.8 02/04/2016 0742   VLDL 35 02/04/2016 0742   LDLCALC 78 02/04/2016 0742   HgbA1c:  Lab Results  Component Value Date   HGBA1C 7.8 (H) 02/06/2016   Urine Drug Screen:     Component Value Date/Time   LABOPIA POSITIVE (A) 02/04/2016 0530   COCAINSCRNUR NONE DETECTED 02/04/2016 0530   LABBENZ NONE DETECTED 02/04/2016 0530   AMPHETMU NONE DETECTED 02/04/2016 0530   THCU POSITIVE (A) 02/04/2016 0530   LABBARB NONE DETECTED 02/04/2016 0530      IMAGING  Ct Head Wo Contrast 02/06/2016 Evolving RIGHT thalamic hemorrhage. Unchanged mild obstructive hydrocephalus. Chronic changes include old LEFT thalamus lacunar infarct and moderate to severe chronic small vessel ischemic disease better characterized on recent MRI.   02/04/2016 Evolving 11 x 30 mm RIGHT caudal thalamic hematoma resulting in mild obstructive hydrocephalus. Severe white matter changes most  compatible with chronic hypertensive encephalopathy, recommend MRI of the head on nonemergent basis for further characterization.   02/03/2016 Interval development of intraparenchymal hemorrhage seen in the right thalamus with surrounding white matter edema. Mild dilatation of lateral ventricles is noted.   [name] 02/06/2016 IMPRESSION: 1. Bilateral acute cerebral watershed infarcts, progressed from the recent MRI. 2. Punctate acute infarct in the right superior cerebellar peduncle. 3. Unchanged right thalamic hemorrhage with surrounding edema and mild obstructive hydrocephalus. 4. Extensive chronic small vessel ischemic disease.    Mri and Mra Brain Wo Contrast 02/05/2016 IMPRESSION: MRI HEAD: Evolving RIGHT thalamic hematoma. Effaced 3rd ventricle with mild acute hydrocephalus. Bilateral sub centimeter acute watershed territory infarcts. Moderate to severe white matter changes consistent with interstitial edema and chronic small vessel ischemic disease. MRA HEAD: No acute vascular process. Mildly ectatic basilar artery.   2D echo  - Left ventricle: The cavity size was normal. Wall thickness was increased in a pattern of moderate LVH. Systolic function was normal. The estimated ejection fraction was in the range of 55% to 60%. Wall motion was normal; there were no regional wall motion abnormalities. Doppler parameters are consistent with restrictive physiology, indicative of decreased left ventricular diastolic compliance and/or increased left atrial pressure. Impressions:   Extremely limited; definity used; normal LV systolic function;  moderate LVH; restrictive filling.  CUS - Bilateral 1-39%  ICA stenosis, antegrade vertebral flow. Difficult exam due to patient cooperation, snoring and body habitus.  LE venous doppler - No evidence of deep vein thrombosis or baker's cysts bilaterally.  TCD - no vasospasm   PHYSICAL EXAM General - morbidly obese middle-aged African-American male  in no apparent distress, Drowsy sleepy.  Cardiovascular - Regular rate and rhythm. Respiratory - snoring breathing but able to protect airway Abdomen:  Soft, obese, ND normal bowel sounds Extremities:  No C/C/E  NEURO Stuporose sleepy, and briefly open eyes but not on command. No vocalization  PERRL, eyes down gaze deviation, positive corneal, left facial droop. Weak spontaneous cough  Motor/Sensory:  With painful stimulation RUE and RLE purposeful movement, but LUE and LLE mild withdrawal.   Coordination and gait not tested due to LOC   ASSESSMENT/PLAN Adam Vincent is a 46 y.o. male with history of hypertension and hyperlipidemia presenting with headache and left-sided weakness. CT showed a right thalamic intracerebral hemorrhage in setting of elevated blood pressure. He was started on IV Cardene admitted to the neuro ICU.  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   Resultant  left hemiparesis and left facial droop, AMS  CT head x 2 evolving right thalamic ICH with mild obstructive hydrocephalus.   MRI - evolving RIGHT thalamic hematoma. Mild acute hydrocephalus. Bilateral sub centimeter acute watershed territory infarcts.   MRA  unremarkable   MR repeat - increase bilateral watershed territory infarcts.  Carotid Doppler  unremarkable   2D Echo  EF 55-60%   EEG abnormal due to diffuse slowing of the waking background  LE venous doppler - no DVT  LDL 78  HgbA1c 7.9  SCDs for VTE prophylaxis Diet NPO time specified  aspirin 81 mg daily prior to admission, now on No antithrombotic secondary to hemorrhage  Ongoing aggressive stroke risk factor management  Therapy recommendations:  pending   Disposition:  Pending  Bilateral watershed territory infarcts - may be due to strict BP control and hypoxia/hypercapnia from OSA.  May explain mental status changes  May be related to strict BP control and hypoxia/hypercarbia from  OSA  BP goal < 160 on 2/2  TCD no sign of vasospasm  Hypertensive emergency  Blood pressure 211/115 on arrival in setting of neurologic symptoms   Started on Cardene in the ED for control, changed to Cleviprex  off labetalol drip  SBP goal < 160 , no longer on Cleviprex  on po meds with labetalol, amlodipine and hydralazine  Increase SBP goal to < 180  obstructive sleep apnea  hypoxia/hypercapnia from OSA  On CPAP at night, off today  CCM following respiratory status closely, will intubate if declines. Would likely need trach if intubated  Hyperlipidemia  Home meds:  No statin  LDL 78  Added Lipitor 10  Continue statin on discharge  Diabetes  Hyperglycemia   Check CBGs every 4 hours - currently NPO  SSI  HgbA1c 7.9 , goal < 7.0  Improved glucoses 157, 161   Lantus 20 units daily at bedtime   Continue to monitor  Dysphagia   NG tube feeding  105 cc/h during the day and off at night for CPAP  CKD, stage III  Creatinine 2.14->2.22-> 1.93->1.87   Likely due to uncontrolled HTN and DM  On IV fluid  Other Stroke Risk Factors  Marijuana user, 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   Morbid Obesity, Body mass index is 50.53 kg/m., recommend weight loss, diet and exercise as appropriate   OSA, uses  cPAP at home  Other Active Problems  Will increase dose of Ritalin to 10 mg at 0600 and 12 noon  Right cheek swelling - intact oral mucosa - concerning for dental abscess; no fever currently.  Will monitor WBCs.  May need to treat empirically  Hospital day # 9    I have personally examined this patient, reviewed notes, independently viewed imaging studies, participated in medical decision making and plan of care.ROS completed by me personally and pertinent positives fully documented  I have made any additions or clarifications directly to the above note.   D/w wife and answered questions. Transfer to step down bed. Increase Ritalin. Greater than 50% of time during this  35 minute visit was spent on counseling and coordination of care about his sleepiness, stroke, intracerebral hemorrhage and answered questions     Antony Contras, MD Medical Director [facility] Pager: [phone] 02/12/2016 1:39 PM  To contact Stroke Continuity provider, please refer to [email or web address] After hours, contact General Neurology

## 2016-02-12 NOTE — Progress Notes (Signed)
Pt taken off CPAP by RN. Pt on room air and tolerating well. Rt will continue to monitor.

## 2016-02-13 DIAGNOSIS — I6300 Cerebral infarction due to thrombosis of unspecified precerebral artery: Secondary | ICD-10-CM

## 2016-02-13 LAB — GLUCOSE, CAPILLARY
GLUCOSE-CAPILLARY: 149 mg/dL — AB (ref 65–99)
GLUCOSE-CAPILLARY: 164 mg/dL — AB (ref 65–99)
GLUCOSE-CAPILLARY: 212 mg/dL — AB (ref 65–99)
Glucose-Capillary: 152 mg/dL — ABNORMAL HIGH (ref 65–99)
Glucose-Capillary: 189 mg/dL — ABNORMAL HIGH (ref 65–99)
Glucose-Capillary: 231 mg/dL — ABNORMAL HIGH (ref 65–99)

## 2016-02-13 MED ORDER — INSULIN ASPART 100 UNIT/ML ~~LOC~~ SOLN: 0.0000 [IU] | SUBCUTANEOUS | Status: DC

## 2016-02-13 MED ORDER — PANTOPRAZOLE SODIUM 40 MG PO PACK
40.0000 mg | PACK | Freq: Every day | ORAL | Status: DC
  Administered 2016-02-14 – 2016-02-18 (×5): 40 mg
  Filled 2016-02-13 (×5): qty 20

## 2016-02-13 MED ORDER — INSULIN ASPART 100 UNIT/ML ~~LOC~~ SOLN
0.0000 [IU] | SUBCUTANEOUS | Status: DC
  Administered 2016-02-13 (×2): 5 [IU] via SUBCUTANEOUS
  Administered 2016-02-14: 3 [IU] via SUBCUTANEOUS
  Administered 2016-02-14: 5 [IU] via SUBCUTANEOUS
  Administered 2016-02-14 (×2): 3 [IU] via SUBCUTANEOUS
  Administered 2016-02-14: 8 [IU] via SUBCUTANEOUS
  Administered 2016-02-14: 3 [IU] via SUBCUTANEOUS
  Administered 2016-02-15 (×3): 5 [IU] via SUBCUTANEOUS
  Administered 2016-02-15 (×2): 3 [IU] via SUBCUTANEOUS
  Administered 2016-02-15 – 2016-02-16 (×2): 5 [IU] via SUBCUTANEOUS
  Administered 2016-02-16: 2 [IU] via SUBCUTANEOUS
  Administered 2016-02-16: 5 [IU] via SUBCUTANEOUS
  Administered 2016-02-16: 3 [IU] via SUBCUTANEOUS
  Administered 2016-02-16: 2 [IU] via SUBCUTANEOUS
  Administered 2016-02-16: 5 [IU] via SUBCUTANEOUS
  Administered 2016-02-16: 2 [IU] via SUBCUTANEOUS
  Administered 2016-02-17: 3 [IU] via SUBCUTANEOUS
  Administered 2016-02-17: 2 [IU] via SUBCUTANEOUS
  Administered 2016-02-17: 3 [IU] via SUBCUTANEOUS
  Administered 2016-02-17: 8 [IU] via SUBCUTANEOUS
  Administered 2016-02-17 – 2016-02-18 (×3): 2 [IU] via SUBCUTANEOUS
  Administered 2016-02-18: 3 [IU] via SUBCUTANEOUS
  Administered 2016-02-19 (×3): 2 [IU] via SUBCUTANEOUS
  Administered 2016-02-20: 3 [IU] via SUBCUTANEOUS
  Administered 2016-02-21: 2 [IU] via SUBCUTANEOUS
  Administered 2016-02-21: 5 [IU] via SUBCUTANEOUS

## 2016-02-13 MED ORDER — DEXTROSE 5 % IV SOLN: 3.0000 g | INTRAVENOUS | Status: DC

## 2016-02-13 NOTE — Progress Notes (Signed)
Inpatient Diabetes Program Recommendations  AACE/ADA: New Consensus Statement on Inpatient Glycemic Control (2015)  Target Ranges:  Prepandial:   less than 140 mg/dL      Peak postprandial:   less than 180 mg/dL (1-2 hours)      Critically ill patients:  140 - 180 mg/dL   Lab Results  Component Value Date   GLUCAP 164 (H) 02/13/2016   HGBA1C 7.8 (H) 02/06/2016    Review of Glycemic Control:  Results for [name], [name] (MRN [id number]) as of 02/13/2016 11:39  Ref. Range 02/12/2016 16:20 02/12/2016 19:50 02/12/2016 23:21 02/13/2016 03:57 02/13/2016 08:17  Glucose-Capillary Latest Ref Range: 65 - 99 mg/dL 201 (H) 227 (H) 142 (H) 149 (H) 164 (H)    Diabetes history: Type 2 diabetes Outpatient Diabetes medications: Metformin 1000 mg with breakfast Current orders for Inpatient glycemic control:  Lantus 20 units q HS, Metformin 500 mg daily, Novolog moderate tid with meals and HS  Inpatient Diabetes Program Recommendations:    Note patient is on tube feeds and is NPO after Midnight for PEG placement.  Consider increasing frequency of Novolog correction to q 4 hours.   Thanks, Adah Perl, RN, BC-ADM Inpatient Diabetes Coordinator Pager [phone] (8a-5p)

## 2016-02-13 NOTE — Progress Notes (Signed)
Patient ID: Adam Vincent, male   DOB: May 15, 1970, 46 y.o.   MRN: [id number] Unable to schedule PEG in Endo tomorrow. Plan Monday, 2/12 with Dr. Hulen Skains. Georganna Skeans, MD, MPH, FACS Trauma: [phone] General Surgery: [phone]

## 2016-02-13 NOTE — Consult Note (Signed)
Reason for Consult:need for PEG Referring Physician: [name] is an 46 y.o. male.  HPI: Adam Vincent was admitted with an acute R thalamic hemorrhage due to a hypertensive crisis. He is unable to take PO safely.   Past Medical History:  Diagnosis Date  . Diabetes mellitus   . Hypertension     Past Surgical History:  Procedure Laterality Date  . ABSCESS DRAINAGE     Left leg    History reviewed. No pertinent family history.  Social History:  reports that he has never smoked. He has never used smokeless tobacco. He reports that he uses drugs, including Marijuana. He reports that he does not drink alcohol.  Allergies: No Known Allergies  Medications: I have reviewed the patient's current medications.  Results for orders placed or performed during the hospital encounter of 02/03/16 (from the past 48 hour(s))  Glucose, capillary     Status: Abnormal   Collection Time: 02/11/16 11:33 AM  Result Value Ref Range   Glucose-Capillary 240 (H) 65 - 99 mg/dL  Glucose, capillary     Status: Abnormal   Collection Time: 02/11/16  3:24 PM  Result Value Ref Range   Glucose-Capillary 239 (H) 65 - 99 mg/dL   Comment 1 Notify RN    Comment 2 Document in Chart   Glucose, capillary     Status: Abnormal   Collection Time: 02/11/16  7:46 PM  Result Value Ref Range   Glucose-Capillary 226 (H) 65 - 99 mg/dL  Glucose, capillary     Status: Abnormal   Collection Time: 02/11/16 11:26 PM  Result Value Ref Range   Glucose-Capillary 155 (H) 65 - 99 mg/dL  Glucose, capillary     Status: Abnormal   Collection Time: 02/12/16  3:27 AM  Result Value Ref Range   Glucose-Capillary 157 (H) 65 - 99 mg/dL  Glucose, capillary     Status: Abnormal   Collection Time: 02/12/16  8:11 AM  Result Value Ref Range   Glucose-Capillary 144 (H) 65 - 99 mg/dL  Glucose, capillary     Status: Abnormal   Collection Time: 02/12/16 12:15 PM  Result Value Ref Range   Glucose-Capillary 245 (H) 65 - 99  mg/dL   Comment 1 Notify RN    Comment 2 Document in Chart   Glucose, capillary     Status: Abnormal   Collection Time: 02/12/16  4:20 PM  Result Value Ref Range   Glucose-Capillary 201 (H) 65 - 99 mg/dL  Glucose, capillary     Status: Abnormal   Collection Time: 02/12/16  7:50 PM  Result Value Ref Range   Glucose-Capillary 227 (H) 65 - 99 mg/dL  Glucose, capillary     Status: Abnormal   Collection Time: 02/12/16 11:21 PM  Result Value Ref Range   Glucose-Capillary 142 (H) 65 - 99 mg/dL  Glucose, capillary     Status: Abnormal   Collection Time: 02/13/16  3:57 AM  Result Value Ref Range   Glucose-Capillary 149 (H) 65 - 99 mg/dL  Glucose, capillary     Status: Abnormal   Collection Time: 02/13/16  8:17 AM  Result Value Ref Range   Glucose-Capillary 164 (H) 65 - 99 mg/dL   Comment 1 Notify RN    Comment 2 Document in Chart     No results found.  Review of Systems  Reason unable to perform ROS: limited by neuro deficits.   Blood pressure (!) 147/76, pulse 89, temperature (!) 100.5 F (38.1 C),  temperature source Axillary, resp. rate (!) 25, height 6\' 1"  (1.854 m), weight (!) 172.8 kg (381 lb), SpO2 94 %. Physical Exam  Constitutional: He appears well-developed and well-nourished. No distress.  HENT:  Head: Normocephalic.  Right Ear: External ear normal.  Left Ear: External ear normal.  Mouth/Throat: Oropharynx is clear and moist.  CorTrak via nare  Neck: Neck supple. No tracheal deviation present.  Cardiovascular: Normal rate and normal heart sounds.   Respiratory: Effort normal and breath sounds normal. No stridor. No respiratory distress. He has no wheezes.  GI: Soft. Bowel sounds are normal. He exhibits no distension. There is no tenderness. There is no rebound and no guarding.  Neurological:  Awake, speech dysarthric, L hemiplegia, did F/C with RUE    Assessment/Plan: S/P R thalamic hemorrhage Need for enteral access - plan PEG placement by Dr. Hulen Skains in endo  tomorrow with anesthesia support. I discussed the procedure, risks, and benefits with his wife. She agrees. I also D/W Dr. Hulen Skains. Will hold TF at [place].  Mika Anastasi E 02/13/2016, 10:52 AM

## 2016-02-13 NOTE — Progress Notes (Signed)
Occupational Therapy Progress Note Addendum     02/13/16 1252  OT Time Calculation  OT Start Time (ACUTE ONLY) 1153  OT Stop Time (ACUTE ONLY) 1223  OT Time Calculation (min) 30 min  OT General Charges  $OT Visit 1 Procedure  OT Treatments  $Neuromuscular Re-education 8-22 mins

## 2016-02-13 NOTE — Progress Notes (Signed)
Occupational Therapy Treatment Patient Details Name: Adam Vincent MRN: [id number] DOB: 03/04/1970 Today's Date: 02/13/2016    History of present illness This 46 y.o. male admitted with generalized weakness and cough. Head CT-Evolving RIGHT thalamic hemorrhage. MRI-Evolving RIGHT thalamic hematoma. Effaced 3rd ventricle with mild acute hydrocephalus. Bilateral sub centimeter acute watershed territory infarcts. PMH includes:  CAD with multiple stents, COPD on 2L 02, HTN, CKDIII, parosysmal A-fib, h/o CVA, syncope, PNA, h/o substance abuse, conversion, anxiety disorder, cognitive impairment.    OT comments  Pt mildly improved compared to last visit.  He required max A for EOB sitting.  He kept eyes closed except very brief period of eye opening.  He did stick out his tongue x 1, and mumbled "I'm ready" when returning to supine.  Otherwise no other responses to postural changes, painful stimuli, music, etc.   Will continue to follow and will attempt to coordinate next session with Ritalin dosage.   Follow Up Recommendations  SNF    Equipment Recommendations  None recommended by OT    Recommendations for Other Services      Precautions / Restrictions Precautions Precautions: Fall Precaution Comments: left hemiplegia; NG tube Restrictions Weight Bearing Restrictions: No       Mobility Bed Mobility Overal bed mobility: Needs Assistance Bed Mobility: Supine to Sit;Sit to Supine     Supine to sit: Total assist;+2 for physical assistance;+2 for safety/equipment;HOB elevated Sit to supine: Total assist;+2 for physical assistance;HOB elevated;+2 for safety/equipment   General bed mobility comments: Total A to bring LEs to EOB and elevate trunk; assist to bring LEs into bed and lower trunk to return to bed.  Transfers                 General transfer comment: unable     Balance Overall balance assessment: Needs assistance Sitting-balance support: Feet supported;No upper  extremity supported Sitting balance-Leahy Scale: Zero Sitting balance - Comments: Requries total A sitting EOB. Sat EOB ~15 mins. Dynamic trunk movement in all directions to try to activate reticular system.                           ADL                                         General ADL Comments: Pt unable to participat in ADL tasks.  He requires total A for all aspects of ADLs       Vision                 Additional Comments: Keeps eyes closed most of time.  When eyes opened manually, gaze appears dysconjugate with Exotropia of Rt eye    Perception     Praxis      Cognition   Behavior During Therapy: Flat affect Overall Cognitive Status: Difficult to assess Area of Impairment: Attention   Current Attention Level: Focused (brief periods of focused attention )    Following Commands: Follows one step commands inconsistently;Follows one step commands with increased time (followed 1 command. )       General Comments: Eye closed for most of session. Able to stick tongue out x1. Grunting at times- "im ready" when asked if he wanted to lay down.     Extremity/Trunk Assessment  Exercises     Shoulder Instructions       General Comments      Pertinent Vitals/ Pain       Pain Assessment: Faces Faces Pain Scale: No hurt  Home Living                                          Prior Functioning/Environment              Frequency  Min 3X/week        Progress Toward Goals  OT Goals(current goals can now be found in the care plan section)  Progress towards OT goals: Not progressing toward goals - comment;Goals drowngraded-see care plan (slightly better compared to last visit )  Acute Rehab OT Goals Patient Stated Goal: none stated as pt unable to at this time. OT Goal Formulation: With family Time For Goal Achievement: 02/27/16 Potential to Achieve Goals: Good ADL Goals Pt Will Perform  Grooming: with max assist;sitting Additional ADL Goal #1: Pt will maintain sustained attention x 30 seconds for ADL tasks  Additional ADL Goal #2: Pt will sit EOB with mod A x 5 mins in prep for ADLs   Plan Discharge plan remains appropriate    Co-evaluation    PT/OT/SLP Co-Evaluation/Treatment: Yes Reason for Co-Treatment: Complexity of the patient's impairments (multi-system involvement);For patient/therapist safety;Necessary to address cognition/behavior during functional activity PT goals addressed during session: Mobility/safety with mobility;Balance OT goals addressed during session: ADL's and self-care;Strengthening/ROM      End of Session     Activity Tolerance Patient limited by lethargy   Patient Left in bed;with call bell/phone within reach;with family/visitor present   Nurse Communication Mobility status        Time:  -     Charges:    Lucille Passy M 02/13/2016, 1:01 PM

## 2016-02-13 NOTE — Progress Notes (Signed)
Speech Language Pathology Treatment: Dysphagia;Cognitive-Linquistic  Patient Details Name: Adam Vincent MRN: [id number] DOB: 06-Mar-1970 Today's Date: 02/13/2016 Time: 0933-1000 SLP Time Calculation (min) (ACUTE ONLY): 27 min  Assessment / Plan / Recommendation Clinical Impression  Pt seen to facilitate attention and arousal and assess readiness for swallowing in setting of persistent lethargy. SLP repositioned pt washed face and provided oral care which resulted in eye opening, soft vocalizations and appropriate oral responses to tactile and verbal cues with oral care. As session progressed pt sustained eyes open for over 5 minutes, followed three one step verbal commands and attempted to say friends name. Despite improving arousal pt did not appropriately respond to PO trials consistently. Max tactile and verbal cues were needed for acceptance of spoon for ice chip and puree. Trials elicited a swallow response with throat clearing. Though arousal is improving, pt is still not capable of PO intake. Will continue to follow for readiness and to facilitate cognition function.    HPI HPI: This 46 y.o. male admitted with generalized weakness and cough. Head CT-Evolving RIGHT thalamic hemorrhage. MRI-Evolving RIGHT thalamic hematoma. Effaced 3rd ventricle with mild acute hydrocephalus. Bilateral sub centimeter acute watershed territory infarcts. PMH includes:  CAD with multiple stents, COPD on 2L 02, HTN, CKDIII, parosysmal A-fib, h/o CVA, syncope, PNA, h/o substance abuse, conversion, anxiety disorder, cognitive impairment.       SLP Plan  Continue with current plan of care     Recommendations  Diet recommendations: NPO Medication Administration: Via alternative means                Oral Care Recommendations: Oral care QID Follow up Recommendations: Skilled Nursing facility Plan: Continue with current plan of care       [place] Beulah Matusek, MA CCC-SLP  [phone]  Lynann Beaver 02/13/2016, 2:39 PM

## 2016-02-13 NOTE — Progress Notes (Signed)
STROKE TEAM PROGRESS NOTE   SUBJECTIVE (INTERVAL HISTORY) Patient remains sleepy and can be aroused . He continues to receive   Ritalin  in morning and it does not  make him more awake.  Wife is at bedside today  OBJECTIVE Temp:  [98.8 F (37.1 C)-100.6 F (38.1 C)] 100.6 F (38.1 C) (02/08 1200) Pulse Rate:  [78-102] 89 (02/08 1000) Cardiac Rhythm: Normal sinus rhythm (02/08 0800) Resp:  [19-33] 25 (02/08 1000) BP: (144-180)/(72-105) 163/85 (02/08 1437) SpO2:  [94 %-99 %] 94 % (02/08 1000) Weight:  [381 lb (172.8 kg)] 381 lb (172.8 kg) (02/08 0406)  CBC:   Recent Labs Lab 02/09/16 0608 02/10/16 0242  WBC 12.6* 12.7*  HGB 10.9* 10.7*  HCT 34.3* 33.9*  MCV 85.8 86.3  PLT 220 425    Basic Metabolic Panel:   Recent Labs Lab 02/09/16 0608 02/10/16 0242  NA 138 140  K 4.4 4.6  CL 107 108  CO2 25 25  GLUCOSE 163* 165*  BUN 30* 30*  CREATININE 1.70* 1.87*  CALCIUM 8.4* 8.3*    Lipid Panel:     Component Value Date/Time   CHOL 153 02/04/2016 0742   TRIG 174 (H) 02/04/2016 0742   HDL 40 (L) 02/04/2016 0742   CHOLHDL 3.8 02/04/2016 0742   VLDL 35 02/04/2016 0742   LDLCALC 78 02/04/2016 0742   HgbA1c:  Lab Results  Component Value Date   HGBA1C 7.8 (H) 02/06/2016   Urine Drug Screen:     Component Value Date/Time   LABOPIA POSITIVE (A) 02/04/2016 0530   COCAINSCRNUR NONE DETECTED 02/04/2016 0530   LABBENZ NONE DETECTED 02/04/2016 0530   AMPHETMU NONE DETECTED 02/04/2016 0530   THCU POSITIVE (A) 02/04/2016 0530   LABBARB NONE DETECTED 02/04/2016 0530      IMAGING  Ct Head Wo Contrast 02/06/2016 Evolving RIGHT thalamic hemorrhage. Unchanged mild obstructive hydrocephalus. Chronic changes include old LEFT thalamus lacunar infarct and moderate to severe chronic small vessel ischemic disease better characterized on recent MRI.   02/04/2016 Evolving 11 x 30 mm RIGHT caudal thalamic hematoma resulting in mild obstructive hydrocephalus. Severe white matter  changes most compatible with chronic hypertensive encephalopathy, recommend MRI of the head on nonemergent basis for further characterization.   02/03/2016 Interval development of intraparenchymal hemorrhage seen in the right thalamus with surrounding white matter edema. Mild dilatation of lateral ventricles is noted.   [name] 02/06/2016 IMPRESSION: 1. Bilateral acute cerebral watershed infarcts, progressed from the recent MRI. 2. Punctate acute infarct in the right superior cerebellar peduncle. 3. Unchanged right thalamic hemorrhage with surrounding edema and mild obstructive hydrocephalus. 4. Extensive chronic small vessel ischemic disease.    Mri and Mra Brain Wo Contrast 02/05/2016 IMPRESSION: MRI HEAD: Evolving RIGHT thalamic hematoma. Effaced 3rd ventricle with mild acute hydrocephalus. Bilateral sub centimeter acute watershed territory infarcts. Moderate to severe white matter changes consistent with interstitial edema and chronic small vessel ischemic disease. MRA HEAD: No acute vascular process. Mildly ectatic basilar artery.   2D echo  - Left ventricle: The cavity size was normal. Wall thickness was increased in a pattern of moderate LVH. Systolic function was normal. The estimated ejection fraction was in the range of 55% to 60%. Wall motion was normal; there were no regional wall motion abnormalities. Doppler parameters are consistent with restrictive physiology, indicative of decreased left ventricular diastolic compliance and/or increased left atrial pressure. Impressions:   Extremely limited; definity used; normal LV systolic function;  moderate LVH; restrictive filling.  CUS - Bilateral 1-39% ICA stenosis, antegrade vertebral flow. Difficult exam due to patient cooperation, snoring and body habitus.  LE venous doppler - No evidence of deep vein thrombosis or baker's cysts bilaterally.  TCD - no vasospasm   PHYSICAL EXAM General - morbidly obese middle-aged  African-American male in no apparent distress, Drowsy sleepy.  Cardiovascular - Regular rate and rhythm. Respiratory - snoring breathing but able to protect airway Abdomen:  Soft, obese, ND normal bowel sounds Extremities:  No C/C/E  NEURO Stuporose sleepy, and briefly open eyes but not on command. No vocalization  PERRL, eyes down gaze deviation, positive corneal, left facial droop. Weak spontaneous cough  Motor/Sensory:  With painful stimulation RUE and RLE purposeful movement, but LUE and LLE mild withdrawal.   Coordination and gait not tested due to LOC   ASSESSMENT/PLAN Adam Vincent is a 46 y.o. male with history of hypertension and hyperlipidemia presenting with headache and left-sided weakness. CT showed a right thalamic intracerebral hemorrhage in setting of elevated blood pressure. He was started on IV Cardene admitted to the neuro ICU.  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   Resultant  left hemiparesis and left facial droop, AMS  CT head x 2 evolving right thalamic ICH with mild obstructive hydrocephalus.   MRI - evolving RIGHT thalamic hematoma. Mild acute hydrocephalus. Bilateral sub centimeter acute watershed territory infarcts.   MRA  unremarkable   MR repeat - increase bilateral watershed territory infarcts.  Carotid Doppler  unremarkable   2D Echo  EF 55-60%   EEG abnormal due to diffuse slowing of the waking background  LE venous doppler - no DVT  LDL 78  HgbA1c 7.9  SCDs for VTE prophylaxis Diet NPO time specified  aspirin 81 mg daily prior to admission, now on No antithrombotic secondary to hemorrhage  Ongoing aggressive stroke risk factor management  Therapy recommendations:  pending   Disposition:  Pending  Bilateral watershed territory infarcts - may be due to strict BP control and hypoxia/hypercapnia from OSA.  May explain mental status changes  May be related to strict BP control and  hypoxia/hypercarbia from OSA  BP goal < 160 on 2/2  TCD no sign of vasospasm  Hypertensive emergency  Blood pressure 211/115 on arrival in setting of neurologic symptoms   Started on Cardene in the ED for control, changed to Cleviprex  off labetalol drip  SBP goal < 160 , no longer on Cleviprex  on po meds with labetalol, amlodipine and hydralazine  Increase SBP goal to < 180  obstructive sleep apnea  hypoxia/hypercapnia from OSA  On CPAP at night, off today  CCM following respiratory status closely, will intubate if declines. Would likely need trach if intubated  Hyperlipidemia  Home meds:  No statin  LDL 78  Added Lipitor 10  Continue statin on discharge  Diabetes  Hyperglycemia   Check CBGs every 4 hours - currently NPO  SSI  HgbA1c 7.9 , goal < 7.0  Improved glucoses 157, 161   Lantus 20 units daily at bedtime   Continue to monitor  Dysphagia   NG tube feeding  105 cc/h during the day and off at night for CPAP  CKD, stage III  Creatinine 2.14->2.22-> 1.93->1.87   Likely due to uncontrolled HTN and DM  On IV fluid  Other Stroke Risk Factors  Marijuana user, 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   Morbid Obesity, Body mass index is 50.27 kg/m., recommend weight loss, diet and exercise as appropriate  OSA, uses cPAP at home  Other Active Problems  Will increase dose of Ritalin to 10 mg at 0600 and 12 noon  Right cheek swelling - intact oral mucosa - concerning for dental abscess; no fever currently.  Will monitor WBCs.  May need to treat empirically  Hospital day # 10    I have personally examined this patient, reviewed notes, independently viewed imaging studies, participated in medical decision making and plan of care.ROS completed by me personally and pertinent positives fully documented  I have made any additions or clarifications directly to the above note.   D/w wife and answered questions. Discussed need for PEG tube with wife who is agreeable.  Called trauma team for PEG.Transfer to step down bed. Continue Ritalin. Greater than 50% of time during this 35 minute visit was spent on counseling and coordination of care about his dysphagia, need for PEG tube,sleepiness,   and answered questions. D/w Dr Lamonte Sakai and Hyman Bower, MD Medical Director [place] Pager: [phone] 02/13/2016 2:40 PM  To contact Stroke Continuity provider, please refer to [email or web address] After hours, contact General Neurology

## 2016-02-13 NOTE — Progress Notes (Signed)
Physical Therapy Treatment Patient Details Name: IVER Vincent MRN: [id number] DOB: 1970-05-02 Today's Date: 02/13/2016    History of Present Illness This 46 y.o. male admitted with generalized weakness and cough. Head CT-Evolving RIGHT thalamic hemorrhage. MRI-Evolving RIGHT thalamic hematoma. Effaced 3rd ventricle with mild acute hydrocephalus. Bilateral sub centimeter acute watershed territory infarcts. PMH includes:  CAD with multiple stents, COPD on 2L 02, HTN, CKDIII, parosysmal A-fib, h/o CVA, syncope, PNA, h/o substance abuse, conversion, anxiety disorder, cognitive impairment.     PT Comments    Mobility still limited due to decreased level of arousal. Earlier in morning, pt more alert with speech therapy after taking Ritalin. Pt keeps eyes closed for most of session despite attempts to stimulate. No response to painful or noxious stimulus in all extremities. Played music to try to help arouse. Pt grunting at times and possibly mumbling some words "i'm ready." Able to initiate movement of LUE on command. Will plan to do PT session after pt takes Ritalin as this seemed to help with arousal. Pt may have been worn out from working with speech therapy earlier in AM. Will follow.   Follow Up Recommendations  CIR;Supervision/Assistance - 24 hour     Equipment Recommendations  Other (comment)    Recommendations for Other Services       Precautions / Restrictions Precautions Precautions: Fall Precaution Comments: left hemiplegia; NG tube Restrictions Weight Bearing Restrictions: No    Mobility  Bed Mobility Overal bed mobility: Needs Assistance Bed Mobility: Supine to Sit;Sit to Supine     Supine to sit: Total assist;+2 for physical assistance;+2 for safety/equipment;HOB elevated Sit to supine: Total assist;+2 for physical assistance;HOB elevated;+2 for safety/equipment   General bed mobility comments: Total A to bring LEs to EOB and elevate trunk; assist to bring LEs into  bed and lower trunk to return to bed.  Transfers                    Ambulation/Gait                 Stairs            Wheelchair Mobility    Modified Rankin (Stroke Patients Only) Modified Rankin (Stroke Patients Only) Pre-Morbid Rankin Score: No symptoms Modified Rankin: Severe disability     Balance Overall balance assessment: Needs assistance Sitting-balance support: Feet supported;No upper extremity supported Sitting balance-Leahy Scale: Zero Sitting balance - Comments: Requries total A sitting EOB. Sat EOB ~15 mins. Dynamic trunk movement in all directions to try to activate reticular system.                            Cognition Arousal/Alertness: Lethargic Behavior During Therapy: Flat affect Overall Cognitive Status: Difficult to assess         Following Commands: Follows one step commands inconsistently;Follows one step commands with increased time (followed 1 command. )       General Comments: Eye closed for most of session. Able to stick tongue out x1. Grunting at times- "im ready" when asked if he wanted to lay down.     Exercises      General Comments General comments (skin integrity, edema, etc.): Family present during session. Able to initiate movement of LUE when asked to lift arm (trace movement).      Pertinent Vitals/Pain Pain Assessment: Faces Faces Pain Scale: No hurt    Home Living  Prior Function            PT Goals (current goals can now be found in the care plan section) Progress towards PT goals: Not progressing toward goals - comment (secondary to decreased level of arousal)    Frequency           PT Plan Current plan remains appropriate    Co-evaluation PT/OT/SLP Co-Evaluation/Treatment: Yes Reason for Co-Treatment: Complexity of the patient's impairments (multi-system involvement);For patient/therapist safety;Necessary to address cognition/behavior during  functional activity PT goals addressed during session: Mobility/safety with mobility;Balance       End of Session   Activity Tolerance: Patient limited by lethargy Patient left: in bed;with call bell/phone within reach;with family/visitor present;with SCD's reapplied     Time: 0802-2336 PT Time Calculation (min) (ACUTE ONLY): 28 min  Charges:  $Therapeutic Activity: 8-22 mins                    G Codes:      Harnoor Kohles A Virda Betters 02/13/2016, 12:38 PM Wray Kearns, [place]re[place], DPT [phone]

## 2016-02-13 NOTE — Progress Notes (Signed)
RT NOTE:  RT feels CPAP is not appropriate for patient tonight. Pt has feeding tube in nostril preventing mask seal, tube feeds has been stopped. Pt is not very responsive @ this time. RN @ bedside and aware. RT feels it is unsafe to place CPAP FFM on patient while he is unable to remove if needed. RT monitoring patient closely @ this time. RN @ bedside and aware.

## 2016-02-14 ENCOUNTER — Inpatient Hospital Stay (HOSPITAL_COMMUNITY): Payer: Medicaid Other

## 2016-02-14 LAB — GLUCOSE, CAPILLARY
GLUCOSE-CAPILLARY: 223 mg/dL — AB (ref 65–99)
GLUCOSE-CAPILLARY: 225 mg/dL — AB (ref 65–99)
GLUCOSE-CAPILLARY: 263 mg/dL — AB (ref 65–99)
Glucose-Capillary: 158 mg/dL — ABNORMAL HIGH (ref 65–99)
Glucose-Capillary: 168 mg/dL — ABNORMAL HIGH (ref 65–99)
Glucose-Capillary: 187 mg/dL — ABNORMAL HIGH (ref 65–99)

## 2016-02-14 NOTE — Progress Notes (Signed)
STROKE TEAM PROGRESS NOTE   SUBJECTIVE (INTERVAL HISTORY) Patient more alert and interactive today . He is able to move left side now a bit. He pulled out his panda tube  OBJECTIVE Temp:  [98.4 F (36.9 C)-101.4 F (38.6 C)] 98.7 F (37.1 C) (02/09 1300) Pulse Rate:  [72-96] 72 (02/09 1300) Cardiac Rhythm: Normal sinus rhythm (02/09 0830) Resp:  [17-32] 17 (02/09 1300) BP: (136-191)/(72-108) 191/108 (02/09 1345) SpO2:  [93 %-99 %] 97 % (02/09 1300) Weight:  [383 lb (173.7 kg)] 383 lb (173.7 kg) (02/09 0500)  CBC:   Recent Labs Lab 02/09/16 0608 02/10/16 0242  WBC 12.6* 12.7*  HGB 10.9* 10.7*  HCT 34.3* 33.9*  MCV 85.8 86.3  PLT 220 195    Basic Metabolic Panel:   Recent Labs Lab 02/09/16 0608 02/10/16 0242  NA 138 140  K 4.4 4.6  CL 107 108  CO2 25 25  GLUCOSE 163* 165*  BUN 30* 30*  CREATININE 1.70* 1.87*  CALCIUM 8.4* 8.3*    Lipid Panel:     Component Value Date/Time   CHOL 153 02/04/2016 0742   TRIG 174 (H) 02/04/2016 0742   HDL 40 (L) 02/04/2016 0742   CHOLHDL 3.8 02/04/2016 0742   VLDL 35 02/04/2016 0742   LDLCALC 78 02/04/2016 0742   HgbA1c:  Lab Results  Component Value Date   HGBA1C 7.8 (H) 02/06/2016   Urine Drug Screen:     Component Value Date/Time   LABOPIA POSITIVE (A) 02/04/2016 0530   COCAINSCRNUR NONE DETECTED 02/04/2016 0530   LABBENZ NONE DETECTED 02/04/2016 0530   AMPHETMU NONE DETECTED 02/04/2016 0530   THCU POSITIVE (A) 02/04/2016 0530   LABBARB NONE DETECTED 02/04/2016 0530      IMAGING  Ct Head Wo Contrast 02/06/2016 Evolving RIGHT thalamic hemorrhage. Unchanged mild obstructive hydrocephalus. Chronic changes include old LEFT thalamus lacunar infarct and moderate to severe chronic small vessel ischemic disease better characterized on recent MRI.   02/04/2016 Evolving 11 x 30 mm RIGHT caudal thalamic hematoma resulting in mild obstructive hydrocephalus. Severe white matter changes most compatible with chronic  hypertensive encephalopathy, recommend MRI of the head on nonemergent basis for further characterization.   02/03/2016 Interval development of intraparenchymal hemorrhage seen in the right thalamus with surrounding white matter edema. Mild dilatation of lateral ventricles is noted.   [name] 02/06/2016 IMPRESSION: 1. Bilateral acute cerebral watershed infarcts, progressed from the recent MRI. 2. Punctate acute infarct in the right superior cerebellar peduncle. 3. Unchanged right thalamic hemorrhage with surrounding edema and mild obstructive hydrocephalus. 4. Extensive chronic small vessel ischemic disease.    Mri and Mra Brain Wo Contrast 02/05/2016 IMPRESSION: MRI HEAD: Evolving RIGHT thalamic hematoma. Effaced 3rd ventricle with mild acute hydrocephalus. Bilateral sub centimeter acute watershed territory infarcts. Moderate to severe white matter changes consistent with interstitial edema and chronic small vessel ischemic disease. MRA HEAD: No acute vascular process. Mildly ectatic basilar artery.   2D echo  - Left ventricle: The cavity size was normal. Wall thickness was increased in a pattern of moderate LVH. Systolic function was normal. The estimated ejection fraction was in the range of 55% to 60%. Wall motion was normal; there were no regional wall motion abnormalities. Doppler parameters are consistent with restrictive physiology, indicative of decreased left ventricular diastolic compliance and/or increased left atrial pressure. Impressions:   Extremely limited; definity used; normal LV systolic function;  moderate LVH; restrictive filling.  CUS - Bilateral 1-39% ICA stenosis, antegrade vertebral flow. Difficult  exam due to patient cooperation, snoring and body habitus.  LE venous doppler - No evidence of deep vein thrombosis or baker's cysts bilaterally.  TCD - no vasospasm   PHYSICAL EXAM General - morbidly obese middle-aged African-American male in no apparent distress,  Drowsy sleepy.  Cardiovascular - Regular rate and rhythm. Respiratory - snoring breathing but able to protect airway Abdomen:  Soft, obese, ND normal bowel sounds Extremities:  No C/C/E  NEURO Stuporose sleepy, and briefly open eyes but not on command. No vocalization  PERRL, eyes down gaze deviation, positive corneal, left facial droop. Weak spontaneous cough  Motor/Sensory:   RUE and RLE purposeful antigravity movement, but LUE and LLE weak 2/5 strength only.   Coordination and gait not tested due to LOC   ASSESSMENT/PLAN Mr. KAYCEN WHITWORTH is a 46 y.o. male with history of hypertension and hyperlipidemia presenting with headache and left-sided weakness. CT showed a right thalamic intracerebral hemorrhage in setting of elevated blood pressure. He was started on IV Cardene admitted to the neuro ICU.  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   Resultant  left hemiparesis and left facial droop, AMS  CT head x 2 evolving right thalamic ICH with mild obstructive hydrocephalus.   MRI - evolving RIGHT thalamic hematoma. Mild acute hydrocephalus. Bilateral sub centimeter acute watershed territory infarcts.   MRA  unremarkable   MR repeat - increase bilateral watershed territory infarcts.  Carotid Doppler  unremarkable   2D Echo  EF 55-60%   EEG abnormal due to diffuse slowing of the waking background  LE venous doppler - no DVT  LDL 78  HgbA1c 7.9  SCDs for VTE prophylaxis Diet NPO time specified  aspirin 81 mg daily prior to admission, now on No antithrombotic secondary to hemorrhage  Ongoing aggressive stroke risk factor management  Therapy recommendations:  pending   Disposition:  Pending  Bilateral watershed territory infarcts - may be due to strict BP control and hypoxia/hypercapnia from OSA.  May explain mental status changes  May be related to strict BP control and hypoxia/hypercarbia from OSA  BP goal < 160 on 2/2  TCD  no sign of vasospasm  Hypertensive emergency  Blood pressure 211/115 on arrival in setting of neurologic symptoms   Started on Cardene in the ED for control, changed to Cleviprex  off labetalol drip  SBP goal < 160 , no longer on Cleviprex  on po meds with labetalol, amlodipine and hydralazine  Increase SBP goal to < 180  obstructive sleep apnea  hypoxia/hypercapnia from OSA  On CPAP at night, off today  CCM following respiratory status closely, will intubate if declines. Would likely need trach if intubated  Hyperlipidemia  Home meds:  No statin  LDL 78  Added Lipitor 10  Continue statin on discharge  Diabetes  Hyperglycemia   Check CBGs every 4 hours - currently NPO  SSI  HgbA1c 7.9 , goal < 7.0  Improved glucoses 157, 161   Lantus 20 units daily at bedtime   Continue to monitor  Dysphagia   NG tube feeding  105 cc/h during the day and off at night for CPAP  CKD, stage III  Creatinine 2.14->2.22-> 1.93->1.87   Likely due to uncontrolled HTN and DM  On IV fluid  Other Stroke Risk Factors  Marijuana user, 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   Morbid Obesity, Body mass index is 50.53 kg/m., recommend weight loss, diet and exercise as appropriate   OSA, uses cPAP at home  Other  Active Problems  Will increase dose of Ritalin to 10 mg at 0600 and 12 noon  Right cheek swelling - intact oral mucosa - concerning for dental abscess; no fever currently.  Will monitor WBCs.  May need to treat empirically  Hospital day # 11    I have personally examined this patient, reviewed notes, independently viewed imaging studies, participated in medical decision making and plan of care.ROS completed by me personally and pertinent positives fully documented  I have made any additions or clarifications directly to the above note.   No family at bedside.Plan  PEG tube on Monday   Replace panda tube today.  Continue Ritalin.      Antony Contras, MD Medical Director [facility]  St[facility] Pager: [phone] 02/14/2016 1:55 PM  To contact Stroke Continuity provider, please refer to [email or web address] After hours, contact General Neurology

## 2016-02-14 NOTE — Progress Notes (Signed)
Patient placed on CPAP without complication. RN turned feeding tube off. RT will monitor as needed.

## 2016-02-14 NOTE — Care Management Note (Signed)
Case Management Note  Patient Details  Name: Adam Vincent MRN: [id number] Date of Birth: September 19, 1970  Subjective/Objective:   Patient with right thalamic intracerebral hemorrhage and elevated bp, was on cardent iv  Transferred to SDU, per pt eval rec CIR , patient is for a peg on Monday.  CSW aware for SNF also, NCM will cont to follow for dc needs.                  Action/Plan:   Expected Discharge Date:                  Expected Discharge Plan:  Skilled Nursing Facility  In-House Referral:  Clinical Social Work  Discharge planning Services  CM Consult  Post Acute Care Choice:    Choice offered to:     DME Arranged:    DME Agency:     HH Arranged:    [place] Agency:     Status of Service:  In process, will continue to follow  If discussed at Long Length of Stay Meetings, dates discussed:    Additional Comments:  Zenon Mayo, RN 02/14/2016, 3:37 PM

## 2016-02-14 NOTE — Progress Notes (Signed)
Pt transferred from [facility] to [place] after report.  Pt arrives sleepy, but arousable.  Call bell within reach.  Will continue to monitor.

## 2016-02-14 NOTE — Progress Notes (Signed)
SLP Cancellation Note  Patient Details Name: Adam Vincent MRN: [id number] DOB: 1970/04/03   Cancelled treatment:       Reason Eval/Treat Not Completed: Patient at procedure or test/unavailable. Will continue efforts for po trials and cognitive therapy.  Liberty Seto B. Quentin Ore [facility], CCC-SLP [phone]  Shonna Chock 02/14/2016, 12:47 PM

## 2016-02-14 NOTE — Progress Notes (Signed)
OT Cancellation Note  Patient Details Name: Adam Vincent MRN: [id number] DOB: Jul 01, 1970   Cancelled Treatment:    Reason Eval/Treat Not Completed: Medical issues which prohibited therapy;Fatigue/lethargy limiting ability to participate. Pt not arousing to auditory, tactile, or noxious stimuli. BP 190/108 in supine. Will hold for PT/OT at this time. Will continue to follow acutely.  Binnie Kand M.S., OTR/L Pager: [phone]  02/14/2016, 1:42 PM

## 2016-02-14 NOTE — Progress Notes (Signed)
Nutrition Follow-up   DOCUMENTATION CODES:   Morbid obesity  INTERVENTION:   Continue:   Jevity 1.5 at 105 ml/h x 12 hours per day (8am-8pm) -- 1260 ml per day  30 ml Prostat TID  Provides: 2190 kcal, 125 gm protein, 958 ml free water daily.   NUTRITION DIAGNOSIS:   Inadequate oral intake related to inability to eat as evidenced by NPO status. Ongoing   GOAL:   Patient will meet greater than or equal to 90% of their needs  Met with TF  MONITOR:   TF tolerance, I & O's, Diet advancement  ASSESSMENT:   Pt with PMH of hypertension and hyperlipidemia presenting with headache and left-sided weakness. CT showed a right thalamic intracerebral hemorrhage in setting of elevated blood pressure  SLP following for dysphagia; patient remains unable to safely take PO's. Remains NPO. Patient receiving TF from 8am-8pm to be able and requires CPAP from 8pm to 8am.  Jevity 1.5 at 105 ml/h x 12 hours per day with Prostat 30 ml TID provides 2190 kcal, 125 gm protein, 958 ml free water daily. Tolerating TF well per discussion with RN. Cortrak tube was accidentally pulled by patient this morning. Cortrak team replaced tube and tip is now in the distal stomach. Labs and medications reviewed.  Diet Order:  Diet NPO time specified  Skin:  Reviewed, no issues  Last BM:  2/8  Height:   Ht Readings from Last 1 Encounters:  02/03/16 '6\' 1"'$  (1.854 m)    Weight:   Wt Readings from Last 1 Encounters:  02/14/16 (!) 383 lb (173.7 kg)    Ideal Body Weight:  83.6 kg  BMI:  Body mass index is 50.53 kg/m.  Estimated Nutritional Needs:   Kcal:  2200-2400  Protein:  115-130 grams  Fluid:  >2.2 L/day  EDUCATION NEEDS:   No education needs identified at this time  Molli Barrows, [place], [place], [place] Pager [phone] After Hours Pager [phone]

## 2016-02-15 ENCOUNTER — Inpatient Hospital Stay (HOSPITAL_COMMUNITY): Payer: Medicaid Other

## 2016-02-15 LAB — CBC
HEMATOCRIT: 36.3 % — AB (ref 39.0–52.0)
HEMOGLOBIN: 11.4 g/dL — AB (ref 13.0–17.0)
MCH: 27.4 pg (ref 26.0–34.0)
MCHC: 31.4 g/dL (ref 30.0–36.0)
MCV: 87.3 fL (ref 78.0–100.0)
Platelets: 335 10*3/uL (ref 150–400)
RBC: 4.16 MIL/uL — AB (ref 4.22–5.81)
RDW: 15 % (ref 11.5–15.5)
WBC: 10.2 10*3/uL (ref 4.0–10.5)

## 2016-02-15 LAB — BASIC METABOLIC PANEL
ANION GAP: 9 (ref 5–15)
BUN: 34 mg/dL — ABNORMAL HIGH (ref 6–20)
CHLORIDE: 101 mmol/L (ref 101–111)
CO2: 26 mmol/L (ref 22–32)
Calcium: 8.5 mg/dL — ABNORMAL LOW (ref 8.9–10.3)
Creatinine, Ser: 1.52 mg/dL — ABNORMAL HIGH (ref 0.61–1.24)
GFR calc non Af Amer: 55 mL/min — ABNORMAL LOW (ref >60)
Glucose, Bld: 177 mg/dL — ABNORMAL HIGH (ref 65–99)
POTASSIUM: 4.7 mmol/L (ref 3.5–5.1)
SODIUM: 136 mmol/L (ref 135–145)

## 2016-02-15 LAB — GLUCOSE, CAPILLARY
GLUCOSE-CAPILLARY: 155 mg/dL — AB (ref 65–99)
GLUCOSE-CAPILLARY: 239 mg/dL — AB (ref 65–99)
GLUCOSE-CAPILLARY: 209 mg/dL — AB (ref 65–99)
Glucose-Capillary: 200 mg/dL — ABNORMAL HIGH (ref 65–99)
Glucose-Capillary: 230 mg/dL — ABNORMAL HIGH (ref 65–99)

## 2016-02-15 MED ORDER — INSULIN GLARGINE 100 UNIT/ML ~~LOC~~ SOLN
15.0000 [IU] | Freq: Two times a day (BID) | SUBCUTANEOUS | Status: DC
  Administered 2016-02-15 – 2016-02-18 (×7): 15 [IU] via SUBCUTANEOUS
  Filled 2016-02-15 (×8): qty 0.15

## 2016-02-15 NOTE — Progress Notes (Signed)
STROKE TEAM PROGRESS NOTE   SUBJECTIVE (INTERVAL HISTORY) No family at bedside. Patient more alert and interactive today. He is able to move left side on pain stimulation. Still on tube feeding, plan for PEG tube insertion Monday.   OBJECTIVE Temp:  [97.9 F (36.6 C)-98.7 F (37.1 C)] 98.5 F (36.9 C) (02/10 0401) Pulse Rate:  [72-82] 73 (02/10 0401) Cardiac Rhythm: Normal sinus rhythm (02/10 0707) Resp:  [15-21] 15 (02/10 0401) BP: (154-191)/(75-108) 163/88 (02/10 0401) SpO2:  [95 %-99 %] 99 % (02/10 0401) Weight:  [168.7 kg (372 lb)] 168.7 kg (372 lb) (02/10 0401)  CBC:   Recent Labs Lab 02/10/16 0242 02/15/16 0244  WBC 12.7* 10.2  HGB 10.7* 11.4*  HCT 33.9* 36.3*  MCV 86.3 87.3  PLT 247 062    Basic Metabolic Panel:   Recent Labs Lab 02/10/16 0242 02/15/16 0244  NA 140 136  K 4.6 4.7  CL 108 101  CO2 25 26  GLUCOSE 165* 177*  BUN 30* 34*  CREATININE 1.87* 1.52*  CALCIUM 8.3* 8.5*    Lipid Panel:     Component Value Date/Time   CHOL 153 02/04/2016 0742   TRIG 174 (H) 02/04/2016 0742   HDL 40 (L) 02/04/2016 0742   CHOLHDL 3.8 02/04/2016 0742   VLDL 35 02/04/2016 0742   LDLCALC 78 02/04/2016 0742   HgbA1c:  Lab Results  Component Value Date   HGBA1C 7.8 (H) 02/06/2016   Urine Drug Screen:     Component Value Date/Time   LABOPIA POSITIVE (A) 02/04/2016 0530   COCAINSCRNUR NONE DETECTED 02/04/2016 0530   LABBENZ NONE DETECTED 02/04/2016 0530   AMPHETMU NONE DETECTED 02/04/2016 0530   THCU POSITIVE (A) 02/04/2016 0530   LABBARB NONE DETECTED 02/04/2016 0530      IMAGING I have personally reviewed the radiological images below and agree with the radiology interpretations.  Ct Head Wo Contrast 02/11/2016 1. Resolving hydrocephalus with expected evolution of right thalamic hemorrhage and decreased mass effect on the third ventricle. 2. Diffuse periventricular and subcortical white matter hypoattenuation representing a combination of watershed  territory infarcts and chronic microvascular ischemic change. These cannot be differentiated on the basis of CT. 3. No acute cortical infarct.  02/06/2016 Evolving RIGHT thalamic hemorrhage. Unchanged mild obstructive hydrocephalus. Chronic changes include old LEFT thalamus lacunar infarct and moderate to severe chronic small vessel ischemic disease better characterized on recent MRI.   02/04/2016 Evolving 11 x 30 mm RIGHT caudal thalamic hematoma resulting in mild obstructive hydrocephalus. Severe white matter changes most compatible with chronic hypertensive encephalopathy, recommend MRI of the head on nonemergent basis for further characterization.   02/03/2016 Interval development of intraparenchymal hemorrhage seen in the right thalamus with surrounding white matter edema. Mild dilatation of lateral ventricles is noted.   [name] 02/06/2016 IMPRESSION: 1. Bilateral acute cerebral watershed infarcts, progressed from the recent MRI. 2. Punctate acute infarct in the right superior cerebellar peduncle. 3. Unchanged right thalamic hemorrhage with surrounding edema and mild obstructive hydrocephalus. 4. Extensive chronic small vessel ischemic disease.    Mri and Mra Brain Wo Contrast 02/05/2016 IMPRESSION: MRI HEAD: Evolving RIGHT thalamic hematoma. Effaced 3rd ventricle with mild acute hydrocephalus. Bilateral sub centimeter acute watershed territory infarcts. Moderate to severe white matter changes consistent with interstitial edema and chronic small vessel ischemic disease. MRA HEAD: No acute vascular process. Mildly ectatic basilar artery.   2D echo  - Left ventricle: The cavity size was normal. Wall thickness was increased in a pattern  of moderate LVH. Systolic function was normal. The estimated ejection fraction was in the range of 55% to 60%. Wall motion was normal; there were no regional wall motion abnormalities. Doppler parameters are consistent with restrictive physiology,  indicative of decreased left ventricular diastolic compliance and/or increased left atrial pressure. Impressions:   Extremely limited; definity used; normal LV systolic function;  moderate LVH; restrictive filling.  CUS - Bilateral 1-39% ICA stenosis, antegrade vertebral flow. Difficult exam due to patient cooperation, snoring and body habitus.  LE venous doppler - No evidence of deep vein thrombosis or baker's cysts bilaterally.  TCD - no vasospasm   PHYSICAL EXAM Physical exam  Temp:  [97.9 F (36.6 C)-99.8 F (37.7 C)] 98.2 F (36.8 C) (02/10 1500) Pulse Rate:  [73-77] 76 (02/10 1500) Resp:  [15-21] 18 (02/10 1500) BP: (144-180)/(75-96) 164/80 (02/10 1500) SpO2:  [97 %-99 %] 97 % (02/10 1500) Weight:  [372 lb (168.7 kg)] 372 lb (168.7 kg) (02/10 0401)  General - morbid obesity, well developed, lethargic, however eyes on pain stimulation. Depressed mood.  Ophthalmologic - Fundi not visualized due to noncooperation  Cardiovascular - Regular rate and rhythm.  Respiratory - snoring breathing but able to protect airway  neuro -  lethargic, but open eyes on pain stimulation. Follows some simple commands. Not answer questions or have speech output. PERRL, eyes moving to both sides, positive corneal, left facial droop. Tongue in middle position. On pain stimulation RUE and RLE purposeful movement, LLE mild withdrawal, and LUE 0/5. DTR 1+ and no Babinski. Sensation, coordination and gait not cooperative.    ASSESSMENT/PLAN [name] is a 46 y.o. male with history of hypertension and hyperlipidemia presenting with headache and left-sided weakness. CT showed a right thalamic intracerebral hemorrhage in setting of elevated blood pressure 211/115. He was started on IV Cardene admitted to the neuro ICU.  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   Resultant  left hemiparesis and left facial droop, AMS  CT head x 2 evolving right thalamic ICH  with mild obstructive hydrocephalus.   MRI - evolving RIGHT thalamic hematoma. Mild acute hydrocephalus. Bilateral sub centimeter acute watershed territory infarcts.   MRA  unremarkable   MR repeat - increase bilateral watershed territory infarcts.  Carotid Doppler  unremarkable   2D Echo  EF 55-60%   EEG abnormal due to diffuse slowing of the waking background  LE venous doppler - no DVT  LDL 78  HgbA1c 7.9  Heparin subcutaneous for VTE prophylaxis Diet NPO time specified  aspirin 81 mg daily prior to admission, now on No antithrombotic secondary to hemorrhage  Ritalin to 10 mg at 0600 and 12 noon for arousal  Ongoing aggressive stroke risk factor management  Therapy recommendations:  CIR vs SNF  Disposition:  Pending  Bilateral watershed territory infarcts - may be due to strict BP control and hypoxia/hypercapnia from OSA.  May explain mental status changes  May be related to strict BP control and hypoxia/hypercarbia from OSA  BP goal < 160   TCD no sign of vasospasm  CPAP PRN at night  Hypertensive emergency  Blood pressure 211/115 on arrival in setting of neurologic symptoms   Started on Cardene in the ED for control, changed to Cleviprex  off labetalol drip  SBP goal < 160 , no longer on Cleviprex  on po meds with labetalol, amlodipine and hydralazine  obstructive sleep apnea  hypoxia/hypercapnia from OSA  Uses CPAP at home at night  CPAP here  at night PRN  Hyperlipidemia  Home meds:  No statin  LDL 78  Added Lipitor 10  Continue statin on discharge  Diabetes  Hyperglycemia still not in good control,   Check CBGs  SSI  HgbA1c 7.9 , goal < 7.0  Increased Lantus to 15 units twice a day   Continue to monitor  Dysphagia   NG tube feeding   PEG placement tentatively planned for Mon 02/17/2016 per Dr Grandville Silos.  105 cc/h for 12 hours during the day   CKD, stage III  Creatinine 2.14->2.22-> 1.93->1.87 -> 1.52  Likely due  to uncontrolled HTN and DM  On IV fluid  Other Stroke Risk Factors  Marijuana user, 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   Morbid Obesity, Body mass index is 49.08 kg/m., recommend weight loss, diet and exercise as appropriate   Other Active Problems  Right cheek swelling - resolved  Leukocytosis - 12.5 -> 10.2 (temp 99.8 axillary)  Hospital day # 12  Rosalin Hawking, MD PhD Stroke Neurology 02/15/2016 5:02 PM  To contact Stroke Continuity provider, please refer to [email or web address] After hours, contact General Neurology

## 2016-02-15 NOTE — Progress Notes (Addendum)
D/c disposition continues to be SNF as recommended per Rehab consult 02/11/16. [phone]

## 2016-02-16 LAB — GLUCOSE, CAPILLARY
GLUCOSE-CAPILLARY: 130 mg/dL — AB (ref 65–99)
GLUCOSE-CAPILLARY: 145 mg/dL — AB (ref 65–99)
GLUCOSE-CAPILLARY: 147 mg/dL — AB (ref 65–99)
GLUCOSE-CAPILLARY: 214 mg/dL — AB (ref 65–99)
GLUCOSE-CAPILLARY: 232 mg/dL — AB (ref 65–99)
Glucose-Capillary: 166 mg/dL — ABNORMAL HIGH (ref 65–99)
Glucose-Capillary: 208 mg/dL — ABNORMAL HIGH (ref 65–99)

## 2016-02-16 LAB — CBC
HEMATOCRIT: 35.4 % — AB (ref 39.0–52.0)
HEMOGLOBIN: 11.1 g/dL — AB (ref 13.0–17.0)
MCH: 27.2 pg (ref 26.0–34.0)
MCHC: 31.4 g/dL (ref 30.0–36.0)
MCV: 86.8 fL (ref 78.0–100.0)
Platelets: 335 10*3/uL (ref 150–400)
RBC: 4.08 MIL/uL — AB (ref 4.22–5.81)
RDW: 15 % (ref 11.5–15.5)
WBC: 10.6 10*3/uL — ABNORMAL HIGH (ref 4.0–10.5)

## 2016-02-16 LAB — BASIC METABOLIC PANEL
Anion gap: 9 (ref 5–15)
BUN: 33 mg/dL — ABNORMAL HIGH (ref 6–20)
CHLORIDE: 104 mmol/L (ref 101–111)
CO2: 25 mmol/L (ref 22–32)
Calcium: 8.7 mg/dL — ABNORMAL LOW (ref 8.9–10.3)
Creatinine, Ser: 1.52 mg/dL — ABNORMAL HIGH (ref 0.61–1.24)
GFR calc non Af Amer: 54 mL/min — ABNORMAL LOW (ref >60)
Glucose, Bld: 139 mg/dL — ABNORMAL HIGH (ref 65–99)
POTASSIUM: 4.5 mmol/L (ref 3.5–5.1)
SODIUM: 138 mmol/L (ref 135–145)

## 2016-02-16 MED ORDER — HYDRALAZINE HCL 50 MG PO TABS
75.0000 mg | ORAL_TABLET | Freq: Three times a day (TID) | ORAL | Status: DC
  Administered 2016-02-16 – 2016-02-17 (×4): 75 mg
  Filled 2016-02-16 (×4): qty 1

## 2016-02-16 NOTE — Progress Notes (Signed)
STROKE TEAM PROGRESS NOTE   SUBJECTIVE (INTERVAL HISTORY) No family at bedside. Patient more alert awake today. Able to mouth some words but largely no speech outpt. Follows some simple commands. Speech following and noticed rapid improvement of swallowing function. Will need close re-evaluation and hold off PEG tomorrow for now.    OBJECTIVE Temp:  [97.7 F (36.5 C)-99.8 F (37.7 C)] 98 F (36.7 C) (02/11 0826) Pulse Rate:  [64-77] 77 (02/11 0826) Cardiac Rhythm: Normal sinus rhythm (02/11 0826) Resp:  [11-18] 13 (02/11 0826) BP: (156-172)/(78-97) 158/78 (02/11 0826) SpO2:  [95 %-99 %] 99 % (02/11 0826) Weight:  [167.8 kg (370 lb)] 167.8 kg (370 lb) (02/11 0425)  CBC:   Recent Labs Lab 02/15/16 0244 02/16/16 0236  WBC 10.2 10.6*  HGB 11.4* 11.1*  HCT 36.3* 35.4*  MCV 87.3 86.8  PLT 335 491    Basic Metabolic Panel:   Recent Labs Lab 02/15/16 0244 02/16/16 0236  NA 136 138  K 4.7 4.5  CL 101 104  CO2 26 25  GLUCOSE 177* 139*  BUN 34* 33*  CREATININE 1.52* 1.52*  CALCIUM 8.5* 8.7*    Lipid Panel:     Component Value Date/Time   CHOL 153 02/04/2016 0742   TRIG 174 (H) 02/04/2016 0742   HDL 40 (L) 02/04/2016 0742   CHOLHDL 3.8 02/04/2016 0742   VLDL 35 02/04/2016 0742   LDLCALC 78 02/04/2016 0742   HgbA1c:  Lab Results  Component Value Date   HGBA1C 7.8 (H) 02/06/2016   Urine Drug Screen:     Component Value Date/Time   LABOPIA POSITIVE (A) 02/04/2016 0530   COCAINSCRNUR NONE DETECTED 02/04/2016 0530   LABBENZ NONE DETECTED 02/04/2016 0530   AMPHETMU NONE DETECTED 02/04/2016 0530   THCU POSITIVE (A) 02/04/2016 0530   LABBARB NONE DETECTED 02/04/2016 0530      IMAGING I have personally reviewed the radiological images below and agree with the radiology interpretations.  Ct Head Wo Contrast 02/11/2016 1. Resolving hydrocephalus with expected evolution of right thalamic hemorrhage and decreased mass effect on the third ventricle. 2. Diffuse  periventricular and subcortical white matter hypoattenuation representing a combination of watershed territory infarcts and chronic microvascular ischemic change. These cannot be differentiated on the basis of CT. 3. No acute cortical infarct.  02/06/2016 Evolving RIGHT thalamic hemorrhage. Unchanged mild obstructive hydrocephalus. Chronic changes include old LEFT thalamus lacunar infarct and moderate to severe chronic small vessel ischemic disease better characterized on recent MRI.   02/04/2016 Evolving 11 x 30 mm RIGHT caudal thalamic hematoma resulting in mild obstructive hydrocephalus. Severe white matter changes most compatible with chronic hypertensive encephalopathy, recommend MRI of the head on nonemergent basis for further characterization.   02/03/2016 Interval development of intraparenchymal hemorrhage seen in the right thalamus with surrounding white matter edema. Mild dilatation of lateral ventricles is noted.   [name] 02/06/2016 IMPRESSION: 1. Bilateral acute cerebral watershed infarcts, progressed from the recent MRI. 2. Punctate acute infarct in the right superior cerebellar peduncle. 3. Unchanged right thalamic hemorrhage with surrounding edema and mild obstructive hydrocephalus. 4. Extensive chronic small vessel ischemic disease.    Mri and Mra Brain Wo Contrast 02/05/2016 IMPRESSION: MRI HEAD: Evolving RIGHT thalamic hematoma. Effaced 3rd ventricle with mild acute hydrocephalus. Bilateral sub centimeter acute watershed territory infarcts. Moderate to severe white matter changes consistent with interstitial edema and chronic small vessel ischemic disease. MRA HEAD: No acute vascular process. Mildly ectatic basilar artery.   2D echo  -  Left ventricle: The cavity size was normal. Wall thickness was increased in a pattern of moderate LVH. Systolic function was normal. The estimated ejection fraction was in the range of 55% to 60%. Wall motion was normal; there were no  regional wall motion abnormalities. Doppler parameters are consistent with restrictive physiology, indicative of decreased left ventricular diastolic compliance and/or increased left atrial pressure. Impressions:   Extremely limited; definity used; normal LV systolic function;  moderate LVH; restrictive filling.  CUS - Bilateral 1-39% ICA stenosis, antegrade vertebral flow. Difficult exam due to patient cooperation, snoring and body habitus.  LE venous doppler - No evidence of deep vein thrombosis or baker's cysts bilaterally.  TCD - no vasospasm   PHYSICAL EXAM  Temp:  [97.7 F (36.5 C)-99.8 F (37.7 C)] 98 F (36.7 C) (02/11 0826) Pulse Rate:  [64-77] 77 (02/11 0826) Resp:  [11-18] 13 (02/11 0826) BP: (156-172)/(78-97) 158/78 (02/11 0826) SpO2:  [95 %-99 %] 99 % (02/11 0826) Weight:  [167.8 kg (370 lb)] 167.8 kg (370 lb) (02/11 0425)  General - morbid obesity, well developed, eyes open on voice stimulation. Depressed mood.  Ophthalmologic - Fundi not visualized due to noncooperation  Cardiovascular - Regular rate and rhythm.  neuro -  lethargic, but open eyes on voice. Follows some simple commands such as "stick out tongue". Not answer questions or have speech output though, but able to mouth "I am here" on command. PERRL, eyes moving to both sides, positive corneal, left facial droop. Tongue in middle position. On pain stimulation RUE and RLE purposeful movement, LLE withdrawal to pain, and LUE 0/5. DTR 1+ and no Babinski. Sensation, coordination and gait not cooperative.    ASSESSMENT/PLAN Adam Vincent is a 46 y.o. male with history of hypertension and hyperlipidemia presenting with headache and left-sided weakness. CT showed a right thalamic intracerebral hemorrhage in setting of elevated blood pressure 211/115. He was started on IV Cardene admitted to the neuro ICU.  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   Resultant  left  hemiparesis and left facial droop, AMS  CT head x 2 evolving right thalamic ICH with mild obstructive hydrocephalus.   MRI - evolving RIGHT thalamic hematoma. Mild acute hydrocephalus. Bilateral sub centimeter acute watershed territory infarcts.   MRA  unremarkable   MR repeat - increase bilateral watershed territory infarcts.  Carotid Doppler  unremarkable   2D Echo  EF 55-60%   EEG abnormal due to diffuse slowing of the waking background  LE venous doppler - no DVT  LDL 78  HgbA1c 7.9  Heparin subcutaneous for VTE prophylaxis Diet NPO time specified Except for: Ice Chips  aspirin 81 mg daily prior to admission, now on No antithrombotic secondary to hemorrhage  Ritalin to 10 mg at 0600 and 12 noon for arousal  Ongoing aggressive stroke risk factor management  Therapy recommendations:  CIR vs SNF (rehabilitation [name] consult ordered)  Disposition:  Pending  Bilateral watershed territory infarcts - may be due to strict BP control and hypoxia/hypercapnia from OSA.  May explain mental status changes  May be related to strict BP control and hypoxia/hypercarbia from OSA  BP goal < 160   TCD no sign of vasospasm  CPAP PRN at night  Hypertensive emergency  Blood pressure 211/115 on arrival in setting of neurologic symptoms   Started on Cardene in the ED for control, changed to Cleviprex  off labetalol drip  SBP goal < 160 , no longer on Cleviprex  on po  meds with labetalol 300 tid, amlodipine 10 and hydralazine 75 tid  obstructive sleep apnea  hypoxia/hypercapnia from OSA  Uses CPAP at home at night  CPAP here at night PRN  Hyperlipidemia  Home meds:  No statin  LDL 78  Added Lipitor 10  Continue statin on discharge  Diabetes  Hyperglycemia still not in good control,   Check CBGs  SSI  HgbA1c 7.9 , goal < 7.0  Increased Lantus to 15 units twice a day   Improved CBG -> 130 this AM  Dysphagia   NG tube feeding   PEG placement  tentatively planned for Mon now on hold per speech therapy. Speech to re evaluate patient in AM.  Trauma PA notified. They will await speech therapy recommendations on Monday.  105 cc/h for 12 hours during the day   IVF @ 40  CKD, stage III  Creatinine 2.14->2.22-> 1.93->1.87 -> 1.52 -> 1.52  Likely due to uncontrolled HTN and DM  On IV fluid  Other Stroke Risk Factors  Marijuana user, 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   Morbid Obesity, Body mass index is 48.82 kg/m., recommend weight loss, diet and exercise as appropriate   Other Active Problems  Right cheek swelling -> resolved  Leukocytosis - 12.5 -> 10.2 -> 10.6 (afebrile)  Hospital day # 13  Adam Hawking, MD PhD Stroke Neurology 02/16/2016 11:43 AM    To contact Stroke Continuity provider, please refer to [email or web address] After hours, contact General Neurology

## 2016-02-16 NOTE — Progress Notes (Signed)
Speech Language Pathology Treatment: Dysphagia  Patient Details Name: Adam Vincent MRN: [id number] DOB: 10/24/1970 Today's Date: 02/16/2016 Time: 4132-4401 SLP Time Calculation (min) (ACUTE ONLY): 31 min  Assessment / Plan / Recommendation Clinical Impression  Pt has significantly improved since last session. He was alert upon SLP arrival this am and sustained arousal throughout session.  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  He was able to follow 75% of one step commands if given a contextual or visual cue. When asked Y/N questions he grunted responses and gave minimal head movements that could be interpreted as responses but were not clear. When given max verbal and visual cues with a tactile cue to increase movement pt was able to nod head firmly Yes and shake head No once each, but could not carry over to communication tasks. Pt initiated speech several times in session, but effort was so weak it was difficult to determine if he was approximating accurate language. Twice pt clearly repeated phrases SLP said in context of being surprised: "Why did you do that?" and "Im not blaming you!"  With swallowing, pt now accepting PO and demonstrated ability to masticate and swallow ice chip x2, but after being given oral care, pt repeatedly swished and spit all liquid boluses given. Swallows were initiated but more reflexively than with volitional oral transit of bolus. Pt appeared frustrated and effortful with this behavior.   Pt has very good potential for PO intake and is improving rapidly. Recommend RN and family give pt ice chips throughout the day to improve automaticity with PO. SLP will f/u in am tomorrow; there is potential pt could tolerate PO this week. Suggest holding PEG tube placement if possible. Also recommend CIR consult given improving and arousal. PT also recommended CIR in their last note.    HPI HPI: This 46 y.o. male admitted with generalized weakness and cough. Head  CT-Evolving RIGHT thalamic hemorrhage. MRI-Evolving RIGHT thalamic hematoma. Effaced 3rd ventricle with mild acute hydrocephalus. Bilateral sub centimeter acute watershed territory infarcts. PMH includes:  CAD with multiple stents, COPD on 2L 02, HTN, CKDIII, parosysmal A-fib, h/o CVA, syncope, PNA, h/o substance abuse, conversion, anxiety disorder, cognitive impairment.       SLP Plan  Continue with current plan of care     Recommendations  Diet recommendations:  (ice chips)                General recommendations: Rehab consult Oral Care Recommendations: Oral care QID Follow up Recommendations: Inpatient Rehab Plan: Continue with current plan of care       GO                Vicy Medico, Katherene Ponto 02/16/2016, 9:01 AM

## 2016-02-16 NOTE — Progress Notes (Signed)
Noted recent rapid improvement with speech therapy and plan for re-evaluation/possible swallow eval tomorrow 02/17/16. We will follow along and either cancel or continue with placement of PEG early next week.  Obie Dredge, PA-C [facility] Pager: [phone] Consults: [phone] Mon-Fri 7:00 am-4:30 pm Sat-Sun 7:00 am-11:30 am

## 2016-02-17 LAB — GLUCOSE, CAPILLARY
GLUCOSE-CAPILLARY: 112 mg/dL — AB (ref 65–99)
GLUCOSE-CAPILLARY: 183 mg/dL — AB (ref 65–99)
Glucose-Capillary: 142 mg/dL — ABNORMAL HIGH (ref 65–99)
Glucose-Capillary: 172 mg/dL — ABNORMAL HIGH (ref 65–99)
Glucose-Capillary: 258 mg/dL — ABNORMAL HIGH (ref 65–99)

## 2016-02-17 LAB — BASIC METABOLIC PANEL
Anion gap: 9 (ref 5–15)
BUN: 32 mg/dL — AB (ref 6–20)
CO2: 27 mmol/L (ref 22–32)
CREATININE: 1.51 mg/dL — AB (ref 0.61–1.24)
Calcium: 8.8 mg/dL — ABNORMAL LOW (ref 8.9–10.3)
Chloride: 104 mmol/L (ref 101–111)
GFR calc Af Amer: >60 mL/min (ref >60)
GFR calc non Af Amer: 55 mL/min — ABNORMAL LOW (ref >60)
Glucose, Bld: 125 mg/dL — ABNORMAL HIGH (ref 65–99)
Potassium: 4.7 mmol/L (ref 3.5–5.1)
SODIUM: 140 mmol/L (ref 135–145)

## 2016-02-17 LAB — CBC
HCT: 35.5 % — ABNORMAL LOW (ref 39.0–52.0)
HEMOGLOBIN: 11 g/dL — AB (ref 13.0–17.0)
MCH: 26.9 pg (ref 26.0–34.0)
MCHC: 31 g/dL (ref 30.0–36.0)
MCV: 86.8 fL (ref 78.0–100.0)
Platelets: 350 10*3/uL (ref 150–400)
RBC: 4.09 MIL/uL — ABNORMAL LOW (ref 4.22–5.81)
RDW: 15 % (ref 11.5–15.5)
WBC: 9.5 10*3/uL (ref 4.0–10.5)

## 2016-02-17 MED ORDER — LABETALOL HCL 5 MG/ML IV SOLN
10.0000 mg | INTRAVENOUS | Status: DC | PRN
  Administered 2016-02-18 – 2016-02-20 (×2): 10 mg via INTRAVENOUS
  Administered 2016-02-20 (×2): 20 mg via INTRAVENOUS
  Administered 2016-02-23: 10 mg via INTRAVENOUS
  Filled 2016-02-17 (×8): qty 4

## 2016-02-17 NOTE — Progress Notes (Signed)
Pt pulled cortrak out only on cycle feeds 8a-8p. will call md regarding meds.

## 2016-02-17 NOTE — Progress Notes (Addendum)
I contacted pt's wife by phone to discuss pt's rehab venue options. She works 4 pm until 12 midnight daily. Pt's 44 yo daughter, Adam Vincent is a Marine scientist and works days. Pt's wife states family can provide 24/7 care of pt at d/c. I await further progress with pt's participation with therapy to assess if pt will be a candidate for an inpt rehab admission. I have also discussed with therapy. [phone]

## 2016-02-17 NOTE — Progress Notes (Signed)
STROKE TEAM PROGRESS NOTE   SUBJECTIVE (INTERVAL HISTORY) Ex wife is at bedside. Patient Is working with PT/OT/speech this morning. He is sitting with assistance at bedside. More awake alert, able to repeat single words. Did not pass swallow today but will do MBS tomorrow. Patient likely does not need PEG tube.   OBJECTIVE Temp:  [97.5 F (36.4 C)-98.9 F (37.2 C)] 97.8 F (36.6 C) (02/12 1143) Pulse Rate:  [67-83] 72 (02/12 1143) Cardiac Rhythm: Normal sinus rhythm (02/12 1143) Resp:  [12-18] 18 (02/12 1143) BP: (138-177)/(61-94) 144/87 (02/12 1323) SpO2:  [97 %-100 %] 97 % (02/12 1143) Weight:  [375 lb (170.1 kg)] 375 lb (170.1 kg) (02/12 0442)  CBC:   Recent Labs Lab 02/16/16 0236 02/17/16 0324  WBC 10.6* 9.5  HGB 11.1* 11.0*  HCT 35.4* 35.5*  MCV 86.8 86.8  PLT 335 811    Basic Metabolic Panel:   Recent Labs Lab 02/16/16 0236 02/17/16 0324  NA 138 140  K 4.5 4.7  CL 104 104  CO2 25 27  GLUCOSE 139* 125*  BUN 33* 32*  CREATININE 1.52* 1.51*  CALCIUM 8.7* 8.8*    Lipid Panel:     Component Value Date/Time   CHOL 153 02/04/2016 0742   TRIG 174 (H) 02/04/2016 0742   HDL 40 (L) 02/04/2016 0742   CHOLHDL 3.8 02/04/2016 0742   VLDL 35 02/04/2016 0742   LDLCALC 78 02/04/2016 0742   HgbA1c:  Lab Results  Component Value Date   HGBA1C 7.8 (H) 02/06/2016   Urine Drug Screen:     Component Value Date/Time   LABOPIA POSITIVE (A) 02/04/2016 0530   COCAINSCRNUR NONE DETECTED 02/04/2016 0530   LABBENZ NONE DETECTED 02/04/2016 0530   AMPHETMU NONE DETECTED 02/04/2016 0530   THCU POSITIVE (A) 02/04/2016 0530   LABBARB NONE DETECTED 02/04/2016 0530      IMAGING I have personally reviewed the radiological images below and agree with the radiology interpretations.  Ct Head Wo Vincent 02/11/2016 1. Resolving hydrocephalus with expected evolution of right thalamic hemorrhage and decreased mass effect on the third ventricle. 2. Diffuse periventricular and  subcortical white matter hypoattenuation representing a combination of watershed territory infarcts and chronic microvascular ischemic change. These cannot be differentiated on the basis of CT. 3. No acute cortical infarct.  02/06/2016 Evolving RIGHT thalamic hemorrhage. Unchanged mild obstructive hydrocephalus. Chronic changes include old LEFT thalamus lacunar infarct and moderate to severe chronic small vessel ischemic disease better characterized on recent MRI.   02/04/2016 Evolving 11 x 30 mm RIGHT caudal thalamic hematoma resulting in mild obstructive hydrocephalus. Severe white matter changes most compatible with chronic hypertensive encephalopathy, recommend MRI of the head on nonemergent basis for further characterization.   02/03/2016 Interval development of intraparenchymal hemorrhage seen in the right thalamus with surrounding white matter edema. Mild dilatation of lateral ventricles is noted.   Mr Brain Wo Vincent 02/06/2016 IMPRESSION: 1. Bilateral acute cerebral watershed infarcts, progressed from the recent MRI. 2. Punctate acute infarct in the right superior cerebellar peduncle. 3. Unchanged right thalamic hemorrhage with surrounding edema and mild obstructive hydrocephalus. 4. Extensive chronic small vessel ischemic disease.    Mri and Mra Brain Wo Vincent 02/05/2016 IMPRESSION: MRI HEAD: Evolving RIGHT thalamic hematoma. Effaced 3rd ventricle with mild acute hydrocephalus. Bilateral sub centimeter acute watershed territory infarcts. Moderate to severe white matter changes consistent with interstitial edema and chronic small vessel ischemic disease. MRA HEAD: No acute vascular process. Mildly ectatic basilar artery.   2D echo  -  Left ventricle: The cavity size was normal. Wall thickness was increased in a pattern of moderate LVH. Systolic function was normal. The estimated ejection fraction was in the range of 55% to 60%. Wall motion was normal; there were no regional wall motion  abnormalities. Doppler parameters are consistent with restrictive physiology, indicative of decreased left ventricular diastolic compliance and/or increased left atrial pressure. Impressions:   Extremely limited; definity used; normal LV systolic function;  moderate LVH; restrictive filling.  CUS - Bilateral 1-39% ICA stenosis, antegrade vertebral flow. Difficult exam due to patient cooperation, snoring and body habitus.  LE venous doppler - No evidence of deep vein thrombosis or baker's cysts bilaterally.  TCD - no vasospasm   PHYSICAL EXAM  Temp:  [97.5 F (36.4 C)-98.9 F (37.2 C)] 97.8 F (36.6 C) (02/12 1143) Pulse Rate:  [67-83] 72 (02/12 1143) Resp:  [12-18] 18 (02/12 1143) BP: (138-177)/(61-94) 144/87 (02/12 1323) SpO2:  [97 %-100 %] 97 % (02/12 1143) Weight:  [375 lb (170.1 kg)] 375 lb (170.1 kg) (02/12 0442)  General - morbid obesity, well developed, depressed mood, more awake alert.  Ophthalmologic - Fundi not visualized due to noncooperation  Cardiovascular - Regular rate and rhythm.  neuro -  eyes open spontaneously, more awake and alert. Follows some simple commands. Not answer questions or have speech output, but able to repeat single words on command. PERRL, eyes moving to both sides, left facial droop. Tongue in middle position. On pain stimulation RUE and RLE purposeful movement, LLE withdrawal to pain, and LUE 0/5. DTR 1+ and no Babinski. Sensation, coordination and gait not cooperative.    ASSESSMENT/PLAN [name] is a 46 y.o. male with history of hypertension and hyperlipidemia presenting with headache and left-sided weakness. CT showed a right thalamic intracerebral hemorrhage in setting of elevated blood pressure 211/115. He was started on IV Cardene admitted to the neuro ICU.  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   Resultant  left hemiparesis and left facial droop, AMS  CT head x 2 evolving right thalamic  ICH with mild obstructive hydrocephalus.   MRI - evolving RIGHT thalamic hematoma. Mild acute hydrocephalus. Bilateral sub centimeter acute watershed territory infarcts.   MRA  unremarkable   MR repeat - increase bilateral watershed territory infarcts.  Carotid Doppler  unremarkable   2D Echo  EF 55-60%   EEG abnormal due to diffuse slowing of the waking background  LE venous doppler - no DVT  LDL 78  HgbA1c 7.9  Heparin subcutaneous for VTE prophylaxis Diet NPO time specified Except for: Ice Chips  aspirin 81 mg daily prior to admission, now on No antithrombotic secondary to hemorrhage  Ritalin to 10 mg at 0600 and 12 noon for arousal  Ongoing aggressive stroke risk factor management  Therapy recommendations:  CIR   Disposition:  Pending  Bilateral watershed territory infarcts - may be due to strict BP control and hypoxia/hypercapnia from OSA.  May explain mental status changes  May be related to strict BP control and hypoxia/hypercarbia from OSA  BP goal < 160   TCD no sign of vasospasm  CPAP PRN at night  Hypertensive emergency  Blood pressure 211/115 on arrival in setting of neurologic symptoms   Started on Cardene in the ED for control, changed to Cleviprex  off labetalol drip  SBP goal < 160 , no longer on Cleviprex  on po meds with labetalol 300 tid, amlodipine 10 and hydralazine 75 tid  obstructive sleep apnea  hypoxia/hypercapnia from OSA  Uses CPAP at home at night  CPAP here at night PRN  Hyperlipidemia  Home meds:  No statin  LDL 78  Added Lipitor 10  Continue statin on discharge  Diabetes  Hyperglycemia still not in good control,   Check CBGs  SSI  HgbA1c 7.9 , goal < 7.0  Increased Lantus to 15 units twice a day   Dysphagia   NG tube feeding   Speech following  MBS tomorrow  Likely does not need a PEG tube  105 cc/h for 12 hours during the day   IVF @ 40  CKD, stage III  Creatinine 2.14->2.22->  1.93->1.87 -> 1.52 -> 1.52  Likely due to uncontrolled HTN and DM  On IV fluid  Other Stroke Risk Factors  Marijuana user, has 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   Morbid Obesity, Body mass index is 49.48 kg/m., recommend weight loss, diet and exercise as appropriate   Other Active Problems  Right cheek swelling -> resolved  Leukocytosis - 12.5 -> 10.2 -> 10.6 -> 5.5 (afebrile)  Hospital day # 14  Rosalin Hawking, MD PhD Stroke Neurology 02/17/2016 4:11 PM    To contact Stroke Continuity provider, please refer to [email or web address] After hours, contact General Neurology

## 2016-02-17 NOTE — Progress Notes (Signed)
Patient in no acute distress. He is alert and following some commands.   Patient supposed to receive speech re-evaluation today. PEG cancelled. Will be able to re-schedule for Wednesday 02/19/16 if necessary. We will continue to follow.   Obie Dredge, PA-C [facility] Pager: [phone] Consults: [phone] Mon-Fri 7:00 am-4:30 pm Sat-Sun 7:00 am-11:30 am

## 2016-02-17 NOTE — Progress Notes (Signed)
Pt pulled cortrak out. Tube feeds are 8am-8pm. Pt has medications hydralazine and labetalol due at 10 via cortrak. MD Leonel Ramsay paged. MD advised to hold off on blood pressure medications for tonight, monitor blood pressures and page back if any issues. MD ordered PRN labetalol. BP currently 145/82 and HR 70's. Cortrak team will be notified in the morning for replacement. Will continue to monitor.

## 2016-02-17 NOTE — Progress Notes (Signed)
Speech Language Pathology Treatment: Dysphagia  Patient Details Name: Adam Vincent MRN: [id number] DOB: April 03, 1970 Today's Date: 02/17/2016 Time: 1761-6073 SLP Time Calculation (min) (ACUTE ONLY): 25 min  Assessment / Plan / Recommendation Clinical Impression  Pt seen with PT to facilitate upright posture for PO trials. Pt sitting edge of bed, fully alert and participatory, SLP provided context for functional communication with PO, expression of basic wants and needs, greeting etc. Pt only able to verbalize with very automatic speech tasks or singing spontaneously. Otherwise, pt needs model to repeat single words or short phrases or reply to Y/N questions. Pt demonstrates excellent automaticity today Po trials, utilizing bilateral UE in self feeding tasks with max assist to fully grip cup, spoon. No oral holding observed, in fact pt more impulsive with liquids. Presentation concerning for delayed swallow, possible residuals. Pt will need objective testing prior to initiating diet, though expect pt to be able to tolerate at least modified textures and do not anticipate need for feeding tube, though will confirm this after FEES tomorrow am.    HPI HPI: This 46 y.o. male admitted with generalized weakness and cough. Head CT-Evolving RIGHT thalamic hemorrhage. MRI-Evolving RIGHT thalamic hematoma. Effaced 3rd ventricle with mild acute hydrocephalus. Bilateral sub centimeter acute watershed territory infarcts. PMH includes:  CAD with multiple stents, COPD on 2L 02, HTN, CKDIII, parosysmal A-fib, h/o CVA, syncope, PNA, h/o substance abuse, conversion, anxiety disorder, cognitive impairment.       SLP Plan  Continue with current plan of care     Recommendations  Diet recommendations: NPO                General recommendations: Rehab consult Oral Care Recommendations: Oral care QID Follow up Recommendations: Inpatient Rehab Plan: Continue with current plan of care       GO                [facility], [place] CCC-SLP [phone]  Lynann Beaver 02/17/2016, 11:44 AM

## 2016-02-17 NOTE — Progress Notes (Addendum)
Physical Therapy Treatment Patient Details Name: Adam Vincent MRN: [id number] DOB: [birth year] Today's Date: 02/17/2016    History of Present Illness This 46 y.o. male admitted with generalized weakness and cough. Head CT-Evolving RIGHT thalamic hemorrhage. MRI-Evolving RIGHT thalamic hematoma. Effaced 3rd ventricle with mild acute hydrocephalus. Bilateral sub centimeter acute watershed territory infarcts. PMH includes:  CAD with multiple stents, COPD on 2L 02, HTN, CKDIII, parosysmal A-fib, h/o CVA, syncope, PNA, h/o substance abuse, conversion, anxiety disorder, cognitive impairment.     PT Comments    Patient with significant improvements in arousal and cognition this session. Patient tolerated increased time at EOB for functional task performance. Patient able to follow simple commands with improved consistency. Patient able to respond appropriately to some verbal inquiries with Prompting to repeat the word. patient was also able to anticipate and "sing" part of a song (unintelligible words). Patient able to demonstrate functional use of UE with multimodal comands for task performance as well as self assist with repositioning. Patient shows LE movement on command but limited overall ROM due to weakness. Given improvements in patients arousal and engagement during session, feel patient would benefit significantly from CIR upon acute discharge.    Follow Up Recommendations  CIR;Supervision/Assistance - 24 hour     Equipment Recommendations  Other (comment)    Recommendations for Other Services Rehab consult     Precautions / Restrictions Precautions Precautions: Fall Precaution Comments: left hemiplegia; NG tube Restrictions Weight Bearing Restrictions: No    Mobility  Bed Mobility Overal bed mobility: Needs Assistance Bed Mobility: Supine to Sit;Sit to Supine Rolling: Total assist;+2 for physical assistance Sidelying to sit: Max assist;+2 for physical assistance;+2 for  safety/equipment Supine to sit: Total assist;+2 for physical assistance;+2 for safety/equipment;HOB elevated Sit to supine: Total assist;+2 for physical assistance;HOB elevated;+2 for safety/equipment   General bed mobility comments: Patient was able to utilize RUE to pull on rail with 2 person max assist to helicopter to EOB, total assist to elevate trunk to upright  Transfers                 General transfer comment: Session focused on functional task performance and sitting balance activities.  Ambulation/Gait                 Stairs            Wheelchair Mobility    Modified Rankin (Stroke Patients Only) Modified Rankin (Stroke Patients Only) Pre-Morbid Rankin Score: No symptoms Modified Rankin: Severe disability     Balance Overall balance assessment: Needs assistance Sitting-balance support: Feet supported;No upper extremity supported Sitting balance-Leahy Scale: Zero Sitting balance - Comments: Total support provided posteriorly, but patient was able to initiate some use of bilateral UEs to reposition. Improved midline awareness Postural control: Posterior lean                          Cognition Arousal/Alertness: Awake/alert Behavior During Therapy: Flat affect   Area of Impairment: Attention   Current Attention Level: Sustained Memory: Decreased short-term memory Following Commands: Follows one step commands with increased time (followed 1 command. ) Safety/Judgement: Decreased awareness of safety;Decreased awareness of deficits Awareness: Intellectual Problem Solving: Slow processing;Decreased initiation;Difficulty sequencing;Requires verbal cues;Requires tactile cues General Comments: Patient with significant improvements in arousal and cognition this session, able to follow simple commands with improved consistency. Patient able to respond appropriately to some verbal inquiries with Prompting to repeat the word. patient was also able  to  anticipate and "sing" part of a song (unintelligible words). Patient able to demonstrate functional use of UE with multimodal comands for task performance as well as self assist with repositioning. Patient shows LE movement on command but limited overall ROM due to weakness.     Exercises Other Exercises Other Exercises: AAROM Bilateral LEs and AAROM during functional tasks for UEs    General Comments General comments (skin integrity, edema, etc.): wife present during session      Pertinent Vitals/Pain Pain Assessment: Faces Faces Pain Scale: No hurt    Home Living                      Prior Function            PT Goals (current goals can now be found in the care plan section) Acute Rehab PT Goals Patient Stated Goal: none stated as pt unable to at this time. PT Goal Formulation: Patient unable to participate in goal setting Time For Goal Achievement: 02/20/16 Potential to Achieve Goals: Fair Progress towards PT goals: Progressing toward goals    Frequency    Min 3X/week      PT Plan Current plan remains appropriate    Co-evaluation PT/OT/SLP Co-Evaluation/Treatment: Yes Reason for Co-Treatment: Complexity of the patient's impairments (multi-system involvement) PT goals addressed during session: Mobility/safety with mobility   SLP goals addressed during session: Swallowing;Communication   End of Session   Activity Tolerance: Patient tolerated treatment well Patient left: in bed;with call bell/phone within reach;with family/visitor present;with SCD's reapplied     Time: 9373-4287 PT Time Calculation (min) (ACUTE ONLY): 25 min  Charges:  $Therapeutic Activity: 8-22 mins                    G Codes:      Duncan Dull 02-23-2016, 12:05 PM Alben Deeds, [place] DPT  [phone]

## 2016-02-18 ENCOUNTER — Encounter (HOSPITAL_COMMUNITY): Payer: Self-pay | Admitting: Anesthesiology

## 2016-02-18 LAB — BASIC METABOLIC PANEL
Anion gap: 10 (ref 5–15)
BUN: 35 mg/dL — ABNORMAL HIGH (ref 6–20)
CHLORIDE: 106 mmol/L (ref 101–111)
CO2: 21 mmol/L — AB (ref 22–32)
CREATININE: 1.56 mg/dL — AB (ref 0.61–1.24)
Calcium: 8.9 mg/dL (ref 8.9–10.3)
GFR calc non Af Amer: 52 mL/min — ABNORMAL LOW (ref >60)
Glucose, Bld: 116 mg/dL — ABNORMAL HIGH (ref 65–99)
POTASSIUM: 5.4 mmol/L — AB (ref 3.5–5.1)
SODIUM: 137 mmol/L (ref 135–145)

## 2016-02-18 LAB — GLUCOSE, CAPILLARY
GLUCOSE-CAPILLARY: 115 mg/dL — AB (ref 65–99)
GLUCOSE-CAPILLARY: 151 mg/dL — AB (ref 65–99)
Glucose-Capillary: 129 mg/dL — ABNORMAL HIGH (ref 65–99)
Glucose-Capillary: 143 mg/dL — ABNORMAL HIGH (ref 65–99)
Glucose-Capillary: 118 mg/dL — ABNORMAL HIGH (ref 65–99)
Glucose-Capillary: 126 mg/dL — ABNORMAL HIGH (ref 65–99)

## 2016-02-18 LAB — CBC
HEMATOCRIT: 37.4 % — AB (ref 39.0–52.0)
HEMOGLOBIN: 11.6 g/dL — AB (ref 13.0–17.0)
MCH: 27 pg (ref 26.0–34.0)
MCHC: 31 g/dL (ref 30.0–36.0)
MCV: 87 fL (ref 78.0–100.0)
Platelets: 332 10*3/uL (ref 150–400)
RBC: 4.3 MIL/uL (ref 4.22–5.81)
RDW: 15.1 % (ref 11.5–15.5)
WBC: 10 10*3/uL (ref 4.0–10.5)

## 2016-02-18 MED ORDER — INSULIN GLARGINE 100 UNIT/ML ~~LOC~~ SOLN
10.0000 [IU] | Freq: Two times a day (BID) | SUBCUTANEOUS | Status: DC
  Administered 2016-02-18 – 2016-02-19 (×2): 10 [IU] via SUBCUTANEOUS
  Filled 2016-02-18 (×4): qty 0.1

## 2016-02-18 MED ORDER — LABETALOL HCL 200 MG PO TABS
300.0000 mg | ORAL_TABLET | Freq: Three times a day (TID) | ORAL | Status: DC
  Administered 2016-02-18 – 2016-02-26 (×18): 300 mg via ORAL
  Filled 2016-02-18 (×19): qty 1

## 2016-02-18 MED ORDER — HYDRALAZINE HCL 50 MG PO TABS
50.0000 mg | ORAL_TABLET | Freq: Three times a day (TID) | ORAL | Status: DC
  Administered 2016-02-18 – 2016-02-26 (×13): 50 mg via ORAL
  Filled 2016-02-18 (×18): qty 1

## 2016-02-18 MED ORDER — PANTOPRAZOLE SODIUM 40 MG PO PACK
40.0000 mg | PACK | Freq: Every day | ORAL | Status: DC
  Administered 2016-02-19 – 2016-02-26 (×7): 40 mg via ORAL
  Filled 2016-02-18 (×7): qty 20

## 2016-02-18 MED ORDER — HYDRALAZINE HCL 50 MG PO TABS
50.0000 mg | ORAL_TABLET | Freq: Three times a day (TID) | ORAL | Status: DC
  Filled 2016-02-18: qty 1

## 2016-02-18 MED ORDER — SERTRALINE HCL 50 MG PO TABS
50.0000 mg | ORAL_TABLET | Freq: Every day | ORAL | Status: DC
  Administered 2016-02-19 – 2016-02-26 (×7): 50 mg via ORAL
  Filled 2016-02-18 (×7): qty 1

## 2016-02-18 MED ORDER — SENNOSIDES-DOCUSATE SODIUM 8.6-50 MG PO TABS
1.0000 | ORAL_TABLET | Freq: Two times a day (BID) | ORAL | Status: DC
  Administered 2016-02-18 – 2016-02-25 (×11): 1 via ORAL
  Filled 2016-02-18 (×12): qty 1

## 2016-02-18 NOTE — Progress Notes (Signed)
Patient transferred from 4E to 5M09. On arrival patient non-verbal but response with his head. Weak on the lt side. No family with him at the bedside. Oriented to the room and made him comfortable.Cardiac monitor applied ad CCMD notified. Safety measures put in place and will keep monitoring.

## 2016-02-18 NOTE — Clinical Social Work Note (Signed)
Clinical Social Work Assessment  Patient Details  Name: Adam Vincent MRN: [id number] Date of Birth: 02-Sep-1970  Date of referral:  02/18/16               Reason for consult:  Facility Placement, Discharge Planning                Permission sought to share information with:  Facility Sport and exercise psychologist, Family Supports Permission granted to share information::  Yes, Verbal Permission Granted  Name::     Renard Hamper  Agency::  SNF's  Relationship::  Wife  Contact Information:  [phone]  Housing/Transportation Living arrangements for the past 2 months:  [place] of Information:  Medical Team, Spouse Patient Interpreter Needed:  None Criminal Activity/Legal Involvement Pertinent to Current Situation/Hospitalization:  No - Comment as needed Significant Relationships:  Adult Children, Spouse, Siblings, Parents Lives with:  Spouse Do you feel safe going back to the place where you live?  Yes Need for family participation in patient care:  Yes (Comment)  Care giving concerns:  PT recommending CIR. CSW initiating SNF backup.   Social Worker assessment / plan:  Patient not oriented. CSW met with patient's wife at bedside. CSW introduced role and explained that PT recommendations would be discussed. Patient's wife aware of CIR recommendation but is agreeable to SNF if needed. CSW explained that 30-day LOG had been approved. Patient's wife stated that a financial counselor met with her when patient was first admitted about disability Medicaid application. CSW awaiting call back from financial counselor to confirm. No further concerns. CSW encouraged patient's wife to contact CSW as needed. Patient's wife said if CSW is unable to reach her, to contact patient's daughter, Jonelle Sidle. CSW will continue to follow patient and his family for support and facilitate discharge to SNF, if needed, once medically stable.  Employment status:  [employer] information:  Self  Pay (Medicaid Pending) PT Recommendations:  Inpatient Rehab Consult Information / Referral to community resources:  [place]  Patient/Family's Response to care:  Patient not oriented. Patient's wife agreeable to SNF if patient cannot go to CIR. Patient's family supportive and involved in patient's care. Patient's wife appreciated social work intervention.  Patient/Family's Understanding of and Emotional Response to Diagnosis, Current Treatment, and Prognosis:  Patient not oriented. Patient's wife has a good understanding of the reason for admission. Patient's wife appears happy with hospital care.  Emotional Assessment Appearance:  Appears stated age Attitude/Demeanor/Rapport:  Unable to Assess Affect (typically observed):  Unable to Assess Orientation:    Alcohol / Substance use:  Illicit Drugs Psych involvement (Current and /or in the community):  No (Comment)  Discharge Needs  Concerns to be addressed:  Care Coordination Readmission within the last 30 days:  No Current discharge risk:  Cognitively Impaired, Dependent with Mobility Barriers to Discharge:  Continued Medical Work up, Inadequate or no insurance   Candie Chroman, LCSW 02/18/2016, 1:44 PM

## 2016-02-18 NOTE — Progress Notes (Signed)
Noted pt passed his swallowing eval. I met with pt and his wife at bedside. Discussed his progress thus far. I await better ability to participate in therapy before hopefully admitting him to inpt rehab. [phone]

## 2016-02-18 NOTE — Progress Notes (Addendum)
STROKE TEAM PROGRESS NOTE   SUBJECTIVE (INTERVAL HISTORY) Wife is at bedside. Patient has passed swallow this am and put on diet. PEG procedure cancelled. He is more awake alert but still not very cooperative with verbal communications. However, wife said yesterday and the day before yesterday, he was talking well and also sung without difficulty. He was even talking with his kids via ipad/iphone.    OBJECTIVE Temp:  [98.5 F (36.9 C)-100 F (37.8 C)] 100 F (37.8 C) (02/13 2054) Pulse Rate:  [65-84] 71 (02/13 2054) Cardiac Rhythm: Normal sinus rhythm (02/13 2102) Resp:  [10-20] 20 (02/13 2054) BP: (111-168)/(62-97) 112/62 (02/13 2054) SpO2:  [92 %-100 %] 98 % (02/13 2054) Weight:  [374 lb (169.6 kg)] 374 lb (169.6 kg) (02/13 0600)  CBC:   Recent Labs Lab 02/17/16 0324 02/18/16 1127  WBC 9.5 10.0  HGB 11.0* 11.6*  HCT 35.5* 37.4*  MCV 86.8 87.0  PLT 350 616    Basic Metabolic Panel:   Recent Labs Lab 02/17/16 0324 02/18/16 0402  NA 140 137  K 4.7 5.4*  CL 104 106  CO2 27 21*  GLUCOSE 125* 116*  BUN 32* 35*  CREATININE 1.51* 1.56*  CALCIUM 8.8* 8.9    Lipid Panel:     Component Value Date/Time   CHOL 153 02/04/2016 0742   TRIG 174 (H) 02/04/2016 0742   HDL 40 (L) 02/04/2016 0742   CHOLHDL 3.8 02/04/2016 0742   VLDL 35 02/04/2016 0742   LDLCALC 78 02/04/2016 0742   HgbA1c:  Lab Results  Component Value Date   HGBA1C 7.8 (H) 02/06/2016   Urine Drug Screen:     Component Value Date/Time   LABOPIA POSITIVE (A) 02/04/2016 0530   COCAINSCRNUR NONE DETECTED 02/04/2016 0530   LABBENZ NONE DETECTED 02/04/2016 0530   AMPHETMU NONE DETECTED 02/04/2016 0530   THCU POSITIVE (A) 02/04/2016 0530   LABBARB NONE DETECTED 02/04/2016 0530      IMAGING I have personally reviewed the radiological images below and agree with the radiology interpretations.  Ct Head Wo Contrast 02/11/2016 1. Resolving hydrocephalus with expected evolution of right thalamic  hemorrhage and decreased mass effect on the third ventricle. 2. Diffuse periventricular and subcortical white matter hypoattenuation representing a combination of watershed territory infarcts and chronic microvascular ischemic change. These cannot be differentiated on the basis of CT. 3. No acute cortical infarct.  02/06/2016 Evolving RIGHT thalamic hemorrhage. Unchanged mild obstructive hydrocephalus. Chronic changes include old LEFT thalamus lacunar infarct and moderate to severe chronic small vessel ischemic disease better characterized on recent MRI.   02/04/2016 Evolving 11 x 30 mm RIGHT caudal thalamic hematoma resulting in mild obstructive hydrocephalus. Severe white matter changes most compatible with chronic hypertensive encephalopathy, recommend MRI of the head on nonemergent basis for further characterization.   02/03/2016 Interval development of intraparenchymal hemorrhage seen in the right thalamus with surrounding white matter edema. Mild dilatation of lateral ventricles is noted.   [name] 02/06/2016 IMPRESSION: 1. Bilateral acute cerebral watershed infarcts, progressed from the recent MRI. 2. Punctate acute infarct in the right superior cerebellar peduncle. 3. Unchanged right thalamic hemorrhage with surrounding edema and mild obstructive hydrocephalus. 4. Extensive chronic small vessel ischemic disease.    Mri and Mra Brain Wo Contrast 02/05/2016 IMPRESSION: MRI HEAD: Evolving RIGHT thalamic hematoma. Effaced 3rd ventricle with mild acute hydrocephalus. Bilateral sub centimeter acute watershed territory infarcts. Moderate to severe white matter changes consistent with interstitial edema and chronic small vessel ischemic disease. MRA HEAD:  No acute vascular process. Mildly ectatic basilar artery.   2D echo  - Left ventricle: The cavity size was normal. Wall thickness was increased in a pattern of moderate LVH. Systolic function was normal. The estimated ejection fraction  was in the range of 55% to 60%. Wall motion was normal; there were no regional wall motion abnormalities. Doppler parameters are consistent with restrictive physiology, indicative of decreased left ventricular diastolic compliance and/or increased left atrial pressure. Impressions:   Extremely limited; definity used; normal LV systolic function;  moderate LVH; restrictive filling.  CUS - Bilateral 1-39% ICA stenosis, antegrade vertebral flow. Difficult exam due to patient cooperation, snoring and body habitus.  LE venous doppler - No evidence of deep vein thrombosis or baker's cysts bilaterally.  TCD - no vasospasm   PHYSICAL EXAM  Temp:  [98.5 F (36.9 C)-100 F (37.8 C)] 100 F (37.8 C) (02/13 2054) Pulse Rate:  [65-84] 71 (02/13 2054) Resp:  [10-20] 20 (02/13 2054) BP: (111-168)/(62-97) 112/62 (02/13 2054) SpO2:  [92 %-100 %] 98 % (02/13 2054) Weight:  [374 lb (169.6 kg)] 374 lb (169.6 kg) (02/13 0600)  General - morbid obesity, well developed, depressed mood, awake alert.  Ophthalmologic - Fundi not visualized due to noncooperation  Cardiovascular - Regular rate and rhythm.  neuro -  eyes open, awake and alert. Follows some simple commands. Not answer questions or have speech output, but able to repeat single words on command. Not cooperative on verbal communications. PERRL, eyes moving to both sides, left facial droop. Tongue in middle position. On pain stimulation RUE and RLE purposeful movement, LLE withdrawal to pain, and LUE 0/5. DTR 1+ and no Babinski. Sensation, coordination and gait not cooperative.    ASSESSMENT/PLAN [name] is a 46 y.o. male with history of hypertension and hyperlipidemia presenting with headache and left-sided weakness. CT showed a right thalamic intracerebral hemorrhage in setting of elevated blood pressure 211/115. He was started on IV Cardene admitted to the neuro ICU.  Stroke:  Right thalamic hemorrhage secondary to hypertensive  emergency.   Resultant  left hemiparesis and left facial droop, AMS  CT head x 2 evolving right thalamic ICH with mild obstructive hydrocephalus.   MRI - evolving RIGHT thalamic hematoma. Mild acute hydrocephalus. Bilateral sub centimeter acute watershed territory infarcts.   MRA  unremarkable   MR repeat - increase bilateral watershed territory infarcts.  Carotid Doppler  unremarkable   2D Echo  EF 55-60%   EEG abnormal due to diffuse slowing of the waking background  LE venous doppler - no DVT  LDL 78  HgbA1c 7.9  Heparin subcutaneous for VTE prophylaxis DIET DYS 2 Room service appropriate? Yes; Fluid consistency: Thin  aspirin 81 mg daily prior to admission, now on No antithrombotic secondary to hemorrhage  Ritalin to 10 mg at 0600 and 12 noon for arousal  Ongoing aggressive stroke risk factor management  Therapy recommendations:  CIR   Disposition:  Transfer to floor  Bilateral watershed territory infarcts - may be due to strict BP control and hypoxia/hypercapnia from OSA.  May explain mental status changes  May be related to strict BP control and hypoxia/hypercarbia from OSA  BP goal < 160   TCD no sign of vasospasm  CPAP PRN at night  Hypertension  BP better controlled  SBP goal < 160   on po meds with labetalol 300 tid, amlodipine 10 and hydralazine 50 tid  Depression  Depressed mood  Not interested in communication with providers  Put on zoloft 50mg    obstructive sleep apnea  hypoxia/hypercapnia from OSA  Uses CPAP at home at night  CPAP here at night PRN  Hyperlipidemia  Home meds:  No statin  LDL 78  Added Lipitor 10  Continue statin on discharge  Diabetes  Hyperglycemia still not in good control,   Check CBGs  SSI  HgbA1c 7.9 , goal < 7.0  Lantus to 10 units twice a day   Dysphagia   Passed swallow  On diet   IVF @ 50  CKD, stage III  Creatinine 2.14->2.22-> 1.93->1.87 -> 1.52 -> 1.52 ->1.56  Likely  due to uncontrolled HTN and DM  On IV fluid  Other Stroke Risk Factors  Marijuana user, has 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   Morbid Obesity, Body mass index is 49.34 kg/m., recommend weight loss, diet and exercise as appropriate   Other Active Problems  Right cheek swelling -> resolved  Leukocytosis - 12.5 -> 10.2 -> 10.6 -> 9.5 -> 10.0 (afebrile)  Hospital day # 15  Rosalin Hawking, MD PhD Stroke Neurology 02/18/2016 9:06 PM    To contact Stroke Continuity provider, please refer to [email or web address] After hours, contact General Neurology

## 2016-02-18 NOTE — Progress Notes (Signed)
Occupational Therapy Treatment Patient Details Name: Adam Vincent MRN: [id number] DOB: 03-11-1970 Today's Date: 02/18/2016    History of present illness This 46 y.o. male admitted with generalized weakness and cough. Head CT-Evolving RIGHT thalamic hemorrhage. MRI-Evolving RIGHT thalamic hematoma. Effaced 3rd ventricle with mild acute hydrocephalus. Bilateral sub centimeter acute watershed territory infarcts. PMH includes:  CAD with multiple stents, COPD on 2L 02, HTN, CKDIII, parosysmal A-fib, h/o CVA, syncope, PNA, h/o substance abuse, conversion, anxiety disorder, cognitive impairment.    OT comments  Pt alert throughout session with inconsistent response to commands. Performed PROM B UE in supine. Pt with trace movement R elbow and forearm and L forearm. Requiring total assist for rolling and bed positioning. Will continue to follow.  Follow Up Recommendations  CIR;Supervision/Assistance - 24 hour    Equipment Recommendations   (defer to next venue)    Recommendations for Other Services      Precautions / Restrictions Precautions Precautions: Fall Restrictions Weight Bearing Restrictions: No       Mobility Bed Mobility Overal bed mobility: Needs Assistance Bed Mobility: Rolling Rolling: Total assist;+2 for physical assistance         General bed mobility comments: rolling for pericare, no attempt to assist  Transfers                      Balance                                   ADL   Eating/Feeding: Total assistance;Bed level                                     General ADL Comments: Pt awake full session.      Vision                     Perception     Praxis      Cognition   Behavior During Therapy: Flat affect Overall Cognitive Status: Difficult to assess          Following Commands: Follows one step commands inconsistently;Follows one step commands with increased time             Extremity/Trunk Assessment               Exercises Other Exercises Other Exercises: B UE PROM all areas x 10, pt with trace movement of R elbow and forearm, L forearm   Shoulder Instructions       General Comments      Pertinent Vitals/ Pain       Pain Assessment: Faces Faces Pain Scale: No hurt  Home Living                                          Prior Functioning/Environment              Frequency  Min 3X/week        Progress Toward Goals  OT Goals(current goals can now be found in the care plan section)  Progress towards OT goals: Not progressing toward goals - comment (pt alert, but minimal participation)  Acute Rehab OT Goals Patient Stated Goal: none stated as pt unable to at this time. Time For Goal  Achievement: 02/27/16 Potential to Achieve Goals: [name] Discharge plan remains appropriate    Co-evaluation                 End of Session     Activity Tolerance Patient tolerated treatment well   Patient Left in bed;with call bell/phone within reach   Nurse Communication          Time: 7366-8159 OT Time Calculation (min): 24 min  Charges: OT General Charges $OT Visit: 1 Procedure OT Treatments $Therapeutic Activity: 23-37 mins  Malka So 02/18/2016, 2:40 PM  [phone]

## 2016-02-18 NOTE — Progress Notes (Signed)
RT found pt off the CPAP and on room air.

## 2016-02-18 NOTE — Clinical Social Work Placement (Signed)
   CLINICAL SOCIAL WORK PLACEMENT  NOTE  Date:  02/18/2016  Patient Details  Name: Adam Vincent MRN: [id number] Date of Birth: [birth year]  Clinical Social Work is seeking post-discharge placement for this patient at the [place] level of care (*CSW will initial, date and re-position this form in  chart as items are completed):  Yes   Patient/family provided with [place] Work Department's list of facilities offering this level of care within the geographic area requested by the patient (or if unable, by the patient's family).  Yes   Patient/family informed of their freedom to choose among providers that offer the needed level of care, that participate in Medicare, Medicaid or managed care program needed by the patient, have an available bed and are willing to accept the patient.  Yes   Patient/family informed of [facility]'s ownership interest in [facility] and [facility], as well as of the fact that they are under no obligation to receive care at these facilities.  PASRR submitted to EDS on 02/18/16     PASRR number received on 02/18/16     Existing PASRR number confirmed on       FL2 transmitted to all facilities in geographic area requested by pt/family on 02/18/16     FL2 transmitted to all facilities within larger geographic area on       Patient informed that his/her managed care company has contracts with or will negotiate with certain facilities, including the following:            Patient/family informed of bed offers received.  Patient chooses bed at       Physician recommends and patient chooses bed at      Patient to be transferred to   on  .  Patient to be transferred to facility by       Patient family notified on   of transfer.  Name of family member notified:        PHYSICIAN Please sign FL2     Additional Comment:    _______________________________________________ Candie Chroman, LCSW 02/18/2016,  1:49 PM

## 2016-02-18 NOTE — NC FL2 (Signed)
[place] LEVEL OF CARE SCREENING TOOL     IDENTIFICATION  Patient Name: Adam Vincent Birthdate: Jul 15, 1970 Sex: male Admission Date (Current Location): 02/03/2016  [facility] and [place] Number:  Herbalist and Address:  [facility], [place] [address] [place], [place] [address]      Provider Number: [id number]  Attending Physician Name and Address:  Rosalin Hawking, MD  Relative Name and Phone Number:       Current Level of Care: Hospital Recommended Level of Care: [place] Prior Approval Number:    Date Approved/Denied:   PASRR Number: [id number]  Discharge Plan: SNF    Current Diagnoses: Patient Active Problem List   Diagnosis Date Noted  . Hydrocephalus   . Cerebrovascular accident (CVA) ([place])   . Morbid obesity ([place])   . Marijuana abuse   . Benign essential HTN   . Diabetes mellitus type 2 in obese ([place])   . Fever   . Acute blood loss anemia   . Dysphagia   . Stage 3 chronic kidney disease   . Lethargy   . Altered mental state   . Thalamic hemorrhage with stroke ([place])   . Uncontrolled hypertension   . ICH (intracerebral hemorrhage) ([place]) 02/03/2016    Orientation RESPIRATION BLADDER Height & Weight        Normal (CPAP at night prn: Large full face mask (Set rate: 0, Respiratory rate: 16, Oxygen Percentage: 21, Flow Rate: 0)) Incontinent, External catheter Weight: (!) 374 lb (169.6 kg) Height:  6\' 1"  (185.4 cm)  BEHAVIORAL SYMPTOMS/MOOD NEUROLOGICAL BOWEL NUTRITION STATUS   (None)  (Stroke) Incontinent Diet (DYS 2)  AMBULATORY STATUS COMMUNICATION OF NEEDS Skin   Total Care Non-Verbally (Moans in response to questions) Normal                       Personal Care Assistance Level of Assistance  Total care       Total Care Assistance: Maximum assistance   Functional Limitations Info  Sight, Hearing, Speech Sight Info: Adequate Hearing Info: Adequate Speech Info:  (Moans)    SPECIAL  CARE FACTORS FREQUENCY  PT (By licensed PT), Blood pressure, OT (By licensed OT), Speech therapy     PT Frequency: 5 x week OT Frequency: 5 x week     Speech Therapy Frequency: 5 x week      Contractures Contractures Info: Not present    Additional Factors Info  Code Status, Allergies Code Status Info: Full Allergies Info: NKDA           Current Medications (02/18/2016):  This is the current hospital active medication list Current Facility-Administered Medications  Medication Dose Route Frequency Provider Last Rate Last Dose  . 0.9 %  sodium chloride infusion   Intravenous Continuous Rosalin Hawking, MD 40 mL/hr at 02/18/16 0603    . acetaminophen (TYLENOL) tablet 650 mg  650 mg Oral Q4H PRN Marliss Coots, PA-C   650 mg at 02/12/16 0354   Or  . acetaminophen (TYLENOL) solution 650 mg  650 mg Per Tube Q4H PRN Marliss Coots, PA-C   650 mg at 02/14/16 0401   Or  . acetaminophen (TYLENOL) suppository 650 mg  650 mg Rectal Q4H PRN Marliss Coots, PA-C   650 mg at 02/04/16 0140  . amLODipine (NORVASC) tablet 10 mg  10 mg Oral Daily Rosalin Hawking, MD   10 mg at 02/18/16 1110  . atorvastatin (LIPITOR)  tablet 10 mg  10 mg Per Tube q1800 Rosalin Hawking, MD   10 mg at 02/17/16 1703  . chlorhexidine (PERIDEX) 0.12 % solution 15 mL  15 mL Mouth Rinse BID Rosalin Hawking, MD   15 mL at 02/18/16 1000  . feeding supplement (JEVITY 1.5 CAL/FIBER) liquid 1,000 mL  1,000 mL Per Tube Continuous Rosalin Hawking, MD 105 mL/hr at 02/17/16 1031 1,000 mL at 02/17/16 1031  . heparin injection 5,000 Units  5,000 Units Subcutaneous Q8H Rosalin Hawking, MD   5,000 Units at 02/18/16 0607  . hydrALAZINE (APRESOLINE) tablet 75 mg  75 mg Per Tube Q8H Rosalin Hawking, MD   75 mg at 02/17/16 1323  . Influenza vac split quadrivalent PF (FLUARIX) injection 0.5 mL  0.5 mL Intramuscular Tomorrow-1000 Greta Doom, MD      . insulin aspart (novoLOG) injection 0-15 Units  0-15 Units Subcutaneous Q4H Garvin Fila, MD   3 Units at 02/18/16  1234  . insulin glargine (LANTUS) injection 15 Units  15 Units Subcutaneous BID Rosalin Hawking, MD   15 Units at 02/18/16 1100  . labetalol (NORMODYNE) tablet 300 mg  300 mg Per Tube TID Rosalin Hawking, MD   300 mg at 02/18/16 1100  . labetalol (NORMODYNE,TRANDATE) injection 10-20 mg  10-20 mg Intravenous Q10 min PRN Greta Doom, MD   10 mg at 02/18/16 0606  . MEDLINE mouth rinse  15 mL Mouth Rinse q12n4p Rosalin Hawking, MD   15 mL at 02/18/16 1200  . metFORMIN (GLUCOPHAGE) tablet 500 mg  500 mg Oral Q breakfast Rosalin Hawking, MD   500 mg at 02/18/16 1000  . methylphenidate (RITALIN) tablet 10 mg  10 mg Oral BID Garvin Fila, MD   10 mg at 02/18/16 1235  . pantoprazole sodium (PROTONIX) 40 mg/20 mL oral suspension 40 mg  40 mg Per Tube Daily Garvin Fila, MD   40 mg at 02/18/16 1000  . senna-docusate (Senokot-S) tablet 1 tablet  1 tablet Per Tube BID Rosalin Hawking, MD   1 tablet at 02/18/16 1100     Discharge Medications: Please see discharge summary for a list of discharge medications.  Relevant Imaging Results:  Relevant Lab Results:   Additional Information SS#: [id number]. Will need 30-day LOG. Waiting on financial counseling to call me back about a Medicaid/disability application but wife says they talked to her about it when patient was admitted.  Candie Chroman, LCSW

## 2016-02-18 NOTE — Care Management Note (Signed)
Case Management Note  Patient Details  Name: VORIS TIGERT MRN: [id number] Date of Birth: 09/06/1970  Subjective/Objective:   Patient with right thalamic hemorrhage, mild acute hydrocephalus and bilateral watershed infarcts with hypertensive emergency.  Patient has passed swallow, will be on Dysp 2 diet, plan is for CIR vs SNF, CSW also following.                   Action/Plan:   Expected Discharge Date:                  Expected Discharge Plan:  Skilled Nursing Facility  In-House Referral:  Clinical Social Work  Discharge planning Services  CM Consult  Post Acute Care Choice:    Choice offered to:     DME Arranged:    DME Agency:     HH Arranged:    [place] Agency:     Status of Service:  Completed, signed off  If discussed at [employer], dates discussed:    Additional Comments:  Zenon Mayo, RN 02/18/2016, 2:43 PM

## 2016-02-18 NOTE — Procedures (Signed)
Objective Swallowing Evaluation: Type of Study: FEES-Fiberoptic Endoscopic Evaluation of Swallow  Patient Details  Name: Adam Vincent MRN: [id number] Date of Birth: Sep 10, 1970  Today's Date: 02/18/2016 Time: SLP Start Time (ACUTE ONLY): 0915-SLP Stop Time (ACUTE ONLY): 0940 SLP Time Calculation (min) (ACUTE ONLY): 25 min  Past Medical History:  Past Medical History:  Diagnosis Date  . Diabetes mellitus   . Hypertension    Past Surgical History:  Past Surgical History:  Procedure Laterality Date  . ABSCESS DRAINAGE     Left leg   HPI: This 46 y.o. male admitted with generalized weakness and cough. Head CT-Evolving RIGHT thalamic hemorrhage. MRI-Evolving RIGHT thalamic hematoma. Effaced 3rd ventricle with mild acute hydrocephalus. Bilateral sub centimeter acute watershed territory infarcts. PMH includes:  CAD with multiple stents, COPD on 2L 02, HTN, CKDIII, parosysmal A-fib, h/o CVA, syncope, PNA, h/o substance abuse, conversion, anxiety disorder, cognitive impairment.   Subjective: pt lethargic   Assessment / Plan / Recommendation  CHL IP CLINICAL IMPRESSIONS 02/18/2016  Therapy Diagnosis WFL  Clinical Impression Pt demonstrates adequate oropharyngeal function though anatomical differences due to obesity make swallow outside of normal limits. Pt pharynx is very narrow due to fatty tissue filling areas that would typically be negative spaces for liquids to pass through. Passage of secretions and bolus fall directly above airway. Pt compensates by early airway protection, which is sufficient to prevent any penetration.  Pt does constantly adduct cords with exhalation to make grunt/throat clearing sound. This is not related to penetration/aspiration. Could not encourage pt to consume purees or soilds during this test due to pts disike of POs offered and sensation of scope. However, pt consumed pudding with automaticity in the preceeding session. No muscular weakness observed that would be  concerning for residuals. Recommend pt consume thin liquids and start out with finely chopped solids. SLP will advance as tolerated after he can be observed with more challening solid textures. Family and MD in agreement.   Impact on safety and function Mild aspiration risk      CHL IP TREATMENT RECOMMENDATION 02/18/2016  Treatment Recommendations Therapy as outlined in treatment plan below     Prognosis 02/18/2016  Prognosis for Safe Diet Advancement Good  Barriers to Reach Goals --  Barriers/Prognosis Comment --    CHL IP DIET RECOMMENDATION 02/18/2016  SLP Diet Recommendations Thin liquid;Dysphagia 2 (Fine chop) solids  Liquid Administration via Cup;Straw  Medication Administration Whole meds with liquid  Compensations Minimize environmental distractions;Slow rate;Small sips/bites  Postural Changes Remain semi-upright after after feeds/meals (Comment);Seated upright at 90 degrees      CHL IP OTHER RECOMMENDATIONS 02/18/2016  Recommended Consults --  Oral Care Recommendations Oral care BID  Other Recommendations --      CHL IP FOLLOW UP RECOMMENDATIONS 02/18/2016  Follow up Recommendations Inpatient Rehab      CHL IP FREQUENCY AND DURATION 02/18/2016  Speech Therapy Frequency (ACUTE ONLY) min 2x/week  Treatment Duration 2 weeks           CHL IP ORAL PHASE 02/18/2016  Oral Phase WFL  Oral - Pudding Teaspoon --  Oral - Pudding Cup --  Oral - Honey Teaspoon --  Oral - Honey Cup --  Oral - Nectar Teaspoon --  Oral - Nectar Cup --  Oral - Nectar Straw --  Oral - Thin Teaspoon --  Oral - Thin Cup --  Oral - Thin Straw --  Oral - Puree --  Oral - Mech Soft --  Oral - Regular --  Oral - Multi-Consistency --  Oral - Pill --  Oral Phase - Comment --    CHL IP PHARYNGEAL PHASE 02/18/2016  Pharyngeal Phase WFL  Pharyngeal- Pudding Teaspoon --  Pharyngeal --  Pharyngeal- Pudding Cup --  Pharyngeal --  Pharyngeal- Honey Teaspoon --  Pharyngeal --  Pharyngeal- Honey Cup --   Pharyngeal --  Pharyngeal- Nectar Teaspoon --  Pharyngeal --  Pharyngeal- Nectar Cup --  Pharyngeal --  Pharyngeal- Nectar Straw --  Pharyngeal --  Pharyngeal- Thin Teaspoon --  Pharyngeal --  Pharyngeal- Thin Cup --  Pharyngeal --  Pharyngeal- Thin Straw --  Pharyngeal --  Pharyngeal- Puree --  Pharyngeal --  Pharyngeal- Mechanical Soft --  Pharyngeal --  Pharyngeal- Regular --  Pharyngeal --  Pharyngeal- Multi-consistency --  Pharyngeal --  Pharyngeal- Pill --  Pharyngeal --  Pharyngeal Comment --     No flowsheet data found.  No flowsheet data found.  Zayne Marovich, Katherene Ponto 02/18/2016, 11:04 AM

## 2016-02-18 NOTE — ED Notes (Signed)
Pt is not very cooperative with verbal communications, pt did shake his head Noin refusal to wear CPAP for the night. Pt is on RA.

## 2016-02-19 ENCOUNTER — Encounter (HOSPITAL_COMMUNITY)
Admission: EM | Disposition: A | Discharge status: Skilled Nursing Facility | Payer: Self-pay | Source: Home / Self Care | Attending: Neurology

## 2016-02-19 LAB — GLUCOSE, CAPILLARY
GLUCOSE-CAPILLARY: 110 mg/dL — AB (ref 65–99)
GLUCOSE-CAPILLARY: 119 mg/dL — AB (ref 65–99)
GLUCOSE-CAPILLARY: 123 mg/dL — AB (ref 65–99)
GLUCOSE-CAPILLARY: 132 mg/dL — AB (ref 65–99)
Glucose-Capillary: 131 mg/dL — ABNORMAL HIGH (ref 65–99)
Glucose-Capillary: 148 mg/dL — ABNORMAL HIGH (ref 65–99)

## 2016-02-19 LAB — CBC
HEMATOCRIT: 36 % — AB (ref 39.0–52.0)
Hemoglobin: 11.1 g/dL — ABNORMAL LOW (ref 13.0–17.0)
MCH: 26.8 pg (ref 26.0–34.0)
MCHC: 30.8 g/dL (ref 30.0–36.0)
MCV: 87 fL (ref 78.0–100.0)
Platelets: 284 10*3/uL (ref 150–400)
RBC: 4.14 MIL/uL — ABNORMAL LOW (ref 4.22–5.81)
RDW: 15.3 % (ref 11.5–15.5)
WBC: 9.4 10*3/uL (ref 4.0–10.5)

## 2016-02-19 LAB — BASIC METABOLIC PANEL
ANION GAP: 11 (ref 5–15)
BUN: 30 mg/dL — ABNORMAL HIGH (ref 6–20)
CO2: 26 mmol/L (ref 22–32)
Calcium: 8.9 mg/dL (ref 8.9–10.3)
Chloride: 102 mmol/L (ref 101–111)
Creatinine, Ser: 1.52 mg/dL — ABNORMAL HIGH (ref 0.61–1.24)
GFR calc Af Amer: >60 mL/min (ref >60)
GFR calc non Af Amer: 54 mL/min — ABNORMAL LOW (ref >60)
GLUCOSE: 120 mg/dL — AB (ref 65–99)
Potassium: 4.6 mmol/L (ref 3.5–5.1)
Sodium: 139 mmol/L (ref 135–145)

## 2016-02-19 SURGERY — EGD (ESOPHAGOGASTRODUODENOSCOPY)
Anesthesia: Monitor Anesthesia Care

## 2016-02-19 MED ORDER — PREMIER PROTEIN SHAKE
11.0000 [oz_av] | Freq: Two times a day (BID) | ORAL | Status: DC
  Administered 2016-02-20 – 2016-02-25 (×8): 11 [oz_av] via ORAL
  Filled 2016-02-19 (×22): qty 325.31

## 2016-02-19 MED ORDER — ADULT MULTIVITAMIN W/MINERALS CH
1.0000 | ORAL_TABLET | Freq: Every day | ORAL | Status: DC
  Administered 2016-02-21 – 2016-02-26 (×6): 1 via ORAL
  Filled 2016-02-19 (×6): qty 1

## 2016-02-19 NOTE — Progress Notes (Signed)
Nutrition Follow-up  DOCUMENTATION CODES:   Morbid obesity  INTERVENTION:  Provide Magic cup BID with meals, each supplement provides 290 kcal and 9 grams of protein Provide Premier Protein Shakes after meals, each supplement provides 160 kcal and 30 grams of protein Provide multivitamin with minerals daily   NUTRITION DIAGNOSIS:   Inadequate oral intake related to inability to eat as evidenced by NPO status.  Ongoing- diet advanced on 2/13, PO intake remains inadequate  GOAL:   Patient will meet greater than or equal to 90% of their needs  unmet  MONITOR:   TF tolerance, I & O's, Diet advancement  REASON FOR ASSESSMENT:   NPO/Clear Liquid Diet    ASSESSMENT:   Pt with PMH of hypertension and hyperlipidemia presenting with headache and left-sided weakness. CT showed a right thalamic intracerebral hemorrhage in setting of elevated blood pressure  Pt was receiving nutrition during [facility] NGT. He pulled out tube on 2/12, had FEES on 2/13 in the AM and diet was advanced to Dysphagia 2 with thin liquids. Pt nonverbal at time of visit, but responded with small nods or soft grunts. Family at bedside reports that patient needs assistance with feeding. He only ate a few bites of breakfast and 1/2 of a magic cup ice cream.  Per nursing notes, he ate 25% of lunch yesterday. He reports poor appetite. RD discussed the importance of nutrition and encouraged PO intake.   Labs: low hemoglobin, hemoglobin A1c=7.8%   Diet Order:  DIET DYS 2 Room service appropriate? Yes; Fluid consistency: Thin  Skin:  Reviewed, no issues  Last BM:  2/13  Height:   Ht Readings from Last 1 Encounters:  02/03/16 6\' 1"  (1.854 m)    Weight:   Wt Readings from Last 1 Encounters:  02/18/16 (!) 374 lb (169.6 kg)    Ideal Body Weight:  83.6 kg  BMI:  Body mass index is 49.34 kg/m.  Estimated Nutritional Needs:   Kcal:  2200-2400  Protein:  115-130 grams  Fluid:  >2.2 L/day  EDUCATION  NEEDS:   No education needs identified at this time  Scarlette Ar RD, LDN, CSP Inpatient Clinical Dietitian Pager: [phone] After Hours Pager: [phone]

## 2016-02-19 NOTE — Progress Notes (Signed)
Patient responds when wife is at bedside. When wife is not at bedside, patient refuse to respond, only moans on occasion. NIH and assessment completion pending  No pain or discomfort observed. Will continue to monitor.

## 2016-02-19 NOTE — Progress Notes (Addendum)
Occupational Therapy Treatment Patient Details Name: Adam Vincent MRN: [id number] DOB: [birth year] Today's Date: 02/19/2016    History of present illness Pt is a 46 y.o. male who presented to the ED with L side weakness. CT revealign intraparenchymal hemorrhage in R thalamus and surrounding white matter edema. Also with B sub centimeter acute watershed territory infarcts. CT on 2/6 revealing resolving hydrocephalus with expected evolution of R thalamic hemorrhage and decreased mass effect on the third ventricle. PMH: diabetes mellitus, hypertension.   OT comments  Pt unable to participate during ADL this session and required total assistance +2 for rolling in bed in preparation for ADL. Pt refusing to attempt UE movement or engage limbs in self-care activities. Utilized Maximove to assist pt to move to chair for pt/therapist safety. Pt awake and watching TV on arrival but becoming lethargic and closing eyes during most of session. He did respond at times with head nods but only minimally. OT will continue to follow acutely. At this time continue to recommend CIR if pt is able to progress with participation. However, will update D/C recommendations as necessary.   Follow Up Recommendations  CIR;Supervision/Assistance - 24 hour    Equipment Recommendations  None recommended by OT    Recommendations for Other Services Rehab consult    Precautions / Restrictions Precautions Precautions: Fall Precaution Comments: left hemiplegia Restrictions Weight Bearing Restrictions: No       Mobility Bed Mobility Overal bed mobility: Needs Assistance Bed Mobility: Rolling;Supine to Sit Rolling: Total assist;+2 for physical assistance   Supine to sit: Total assist;+2 for physical assistance;+2 for safety/equipment     General bed mobility comments: Pt unable to assist with rolling. Total +2 to roll for limb management and third person to apply maximove pad under pt's trunk. Due to pt inability to  assist with rolling and moving limbs, multiple bouts of rolling occurred for correct placement and adjustment of maximove pad.   Transfers Overall transfer level: Needs assistance Equipment used:  (Maximove)             General transfer comment: Pt unable to engage in mobility. Utilized maximove for pt safety to transfer to chair.    Balance Overall balance assessment: Needs assistance Sitting-balance support: Feet supported;No upper extremity supported Sitting balance-Leahy Scale: Zero Sitting balance - Comments: Pt able to sit in chair but no further assessment done due to pt's inability to participate; likely zero sitting balance. Postural control: Posterior lean                         ADL Overall ADL's : Needs assistance/impaired                         Toilet Transfer: Total assistance;+2 for physical assistance (with maximove) Toilet Transfer Details (indicate cue type and reason): Simulated. Pt unable to follow commands this session. Did grunt and nod in response to questions inconsistently.           General ADL Comments: Pt awake on arrival but lethargic and sleeping during bed mobility. Did become more alert after transfer but demonstrated limited interaction with therapists.      Vision                 Additional Comments: Pt keeping eyes closed for majority of session.   Perception     Praxis      Cognition   Behavior During Therapy: Flat affect Overall Cognitive  Status: Difficult to assess Area of Impairment: Attention;Following commands   Current Attention Level: Selective    Following Commands: Follows one step commands inconsistently (Very little ability to follow commands this session.)       General Comments: Pt awake and watching tv when SPTA/OT entered room. Pt unable to follow commands due to lethargic, flat affect presence. Intermittent eye closing, possibly due to being tired. and required sternal rub and  addressing pt with name to open eyes.  Lethargic state could be from being tired, but unable to respond when asked. Would respond to occassional question with a grunt but never used yes or no responses. Difficult for pt to engage with bed mobility throughout treatment    Extremity/Trunk Assessment               Exercises General Exercises - Upper Extremity Shoulder Flexion: PROM;Both;10 reps (Good scapulohumeral rhythm) Elbow Flexion: PROM;10 reps;Seated;Both Wrist Flexion: PROM;Both;10 reps Other Exercises Other Exercises: Cervical PROM and stretching as pt with preferred neck position falling to the R. Provided positioning of pt's neck to prevent contracture.   Shoulder Instructions       General Comments      Pertinent Vitals/ Pain       Pain Assessment:  (Pt unable to verbalize during session; no signs of distress)  Home Living                                          Prior Functioning/Environment              Frequency  Min 3X/week        Progress Toward Goals  OT Goals(current goals can now be found in the care plan section)  Progress towards OT goals: Not progressing toward goals - comment (decreased arousal)  Acute Rehab OT Goals Patient Stated Goal: none stated OT Goal Formulation: With family Time For Goal Achievement: 02/27/16 Potential to Achieve Goals: Fair ADL Goals Pt Will Perform Eating: with supervision;with set-up;sitting Pt Will Perform Grooming: with max assist;sitting Pt Will Perform Upper Body Bathing: with min assist;sitting Pt Will Perform Lower Body Bathing: with mod assist;sit to/from stand Pt Will Transfer to Toilet: with mod assist;stand pivot transfer;bedside commode Additional ADL Goal #1: Pt will maintain sustained attention x 30 seconds for ADL tasks  Additional ADL Goal #2: Pt will sit EOB with mod A x 5 mins in prep for ADLs  Additional ADL Goal #3: Pt will maintain adequate eye opening to complete visual  assessment   Plan Discharge plan remains appropriate    Co-evaluation    PT/OT/SLP Co-Evaluation/Treatment: Yes Reason for Co-Treatment: Complexity of the patient's impairments (multi-system involvement);For patient/therapist safety PT goals addressed during session: Mobility/safety with mobility;Strengthening/ROM (Positioning and PROM) OT goals addressed during session: ADL's and self-care;Strengthening/ROM;Other (comment) (Positioning)      End of Session Equipment Utilized During Treatment:  [employer] Ao)   Activity Tolerance Patient tolerated treatment well   Patient Left in chair;with call bell/phone within reach;with chair alarm set   Nurse Communication Mobility status;Need for lift equipment        Time: 1511-1553 OT Time Calculation (min): 42 min  Charges: OT General Charges $OT Visit: 1 Procedure OT Treatments $Self Care/Home Management : 23-37 mins Norman Herrlich, MS OTR/L  Pager: [place] A Ashdon Gillson 02/19/2016, 5:32 PM

## 2016-02-19 NOTE — Progress Notes (Addendum)
Speech Language Pathology Treatment: Dysphagia  Patient Details Name: Adam Vincent MRN: [id number] DOB: December 16, 1970 Today's Date: 02/19/2016 Time: 4098-1191 SLP Time Calculation (min) (ACUTE ONLY): 30 min  Assessment / Plan / Recommendation Clinical Impression  Pt seen at bedside for observation at mealtime, assessment of diet tolerance and education. Pt was observed with Dys 2/thin liquids, requiring total assist today. Pt awake and alert, and needed encouragement initially, but then accepted boluses of solid and liquid without difficulty. Pt nonverbal today, but exhibited intermittent grunting noises. Intermittent ability to follow verbal commands noted. No overt s/s aspiration observed during po trials, however, pt did exhibit slow oral prep and propulsion. Chicken on lunch tray was cut into pieces, but was not "finely chopped". SLP cut meat into smaller pieces per FEES recommendations. Safe swallow precautions posted at [facility] and reviewed with RN. RN indicated pt was responding well to me, where usually there is responsiveness to family only, and minimal response to staff. ST will continue to follow for diet tolerance assessment and education. No family present at this time. CIR continues to monitor pt - came to see pt during meal.   HPI HPI: This 46 y.o. male admitted with generalized weakness and cough. Head CT-Evolving RIGHT thalamic hemorrhage. MRI-Evolving RIGHT thalamic hematoma. Effaced 3rd ventricle with mild acute hydrocephalus. Bilateral sub centimeter acute watershed territory infarcts. PMH includes:  CAD with multiple stents, COPD on 2L 02, HTN, CKDIII, parosysmal A-fib, h/o CVA, syncope, PNA, h/o substance abuse, conversion, anxiety disorder, cognitive impairment.    SLP Plan  Continue with current plan of care     Recommendations  Diet recommendations: Thin liquid;Dysphagia 2 (fine chop) Liquids provided via: Straw;Cup Medication Administration: Whole meds with  liquid Supervision: Full supervision/cueing for compensatory strategies Compensations: Minimize environmental distractions;Slow rate;Small sips/bites Postural Changes and/or Swallow Maneuvers: Seated upright 90 degrees;Upright 30-60 min after meal      General recommendations: Rehab consult Oral Care Recommendations: Oral care QID Follow up Recommendations: Inpatient Rehab Plan: Continue with current plan of care       Fernando Torry B. Quentin Ore [facility], CCC-SLP [phone]  Shonna Chock 02/19/2016, 12:27 PM

## 2016-02-19 NOTE — Progress Notes (Signed)
STROKE TEAM PROGRESS NOTE   SUBJECTIVE (INTERVAL HISTORY) Wife is at bedside. Pt not cooperative with nurse and providers for exam and medication, refused CPAP last night, but able to do most of the activities with wife, such as eating, drinking, taking medications.    OBJECTIVE Temp:  [98 F (36.7 C)-100 F (37.8 C)] 98.9 F (37.2 C) (02/14 1838) Pulse Rate:  [71-92] 92 (02/14 1838) Cardiac Rhythm: Normal sinus rhythm (02/14 0700) Resp:  [18-20] 20 (02/14 1838) BP: (112-175)/(62-91) 160/91 (02/14 1838) SpO2:  [95 %-99 %] 97 % (02/14 1838)  CBC:   Recent Labs Lab 02/18/16 1127 02/19/16 0508  WBC 10.0 9.4  HGB 11.6* 11.1*  HCT 37.4* 36.0*  MCV 87.0 87.0  PLT 332 644    Basic Metabolic Panel:   Recent Labs Lab 02/18/16 0402 02/19/16 0508  NA 137 139  K 5.4* 4.6  CL 106 102  CO2 21* 26  GLUCOSE 116* 120*  BUN 35* 30*  CREATININE 1.56* 1.52*  CALCIUM 8.9 8.9    Lipid Panel:     Component Value Date/Time   CHOL 153 02/04/2016 0742   TRIG 174 (H) 02/04/2016 0742   HDL 40 (L) 02/04/2016 0742   CHOLHDL 3.8 02/04/2016 0742   VLDL 35 02/04/2016 0742   LDLCALC 78 02/04/2016 0742   HgbA1c:  Lab Results  Component Value Date   HGBA1C 7.8 (H) 02/06/2016   Urine Drug Screen:     Component Value Date/Time   LABOPIA POSITIVE (A) 02/04/2016 0530   COCAINSCRNUR NONE DETECTED 02/04/2016 0530   LABBENZ NONE DETECTED 02/04/2016 0530   AMPHETMU NONE DETECTED 02/04/2016 0530   THCU POSITIVE (A) 02/04/2016 0530   LABBARB NONE DETECTED 02/04/2016 0530      IMAGING I have personally reviewed the radiological images below and agree with the radiology interpretations.  Ct Head Wo Contrast 02/11/2016 1. Resolving hydrocephalus with expected evolution of right thalamic hemorrhage and decreased mass effect on the third ventricle. 2. Diffuse periventricular and subcortical white matter hypoattenuation representing a combination of watershed territory infarcts and chronic  microvascular ischemic change. These cannot be differentiated on the basis of CT. 3. No acute cortical infarct.  02/06/2016 Evolving RIGHT thalamic hemorrhage. Unchanged mild obstructive hydrocephalus. Chronic changes include old LEFT thalamus lacunar infarct and moderate to severe chronic small vessel ischemic disease better characterized on recent MRI.   02/04/2016 Evolving 11 x 30 mm RIGHT caudal thalamic hematoma resulting in mild obstructive hydrocephalus. Severe white matter changes most compatible with chronic hypertensive encephalopathy, recommend MRI of the head on nonemergent basis for further characterization.   02/03/2016 Interval development of intraparenchymal hemorrhage seen in the right thalamus with surrounding white matter edema. Mild dilatation of lateral ventricles is noted.   [name] 02/06/2016 IMPRESSION: 1. Bilateral acute cerebral watershed infarcts, progressed from the recent MRI. 2. Punctate acute infarct in the right superior cerebellar peduncle. 3. Unchanged right thalamic hemorrhage with surrounding edema and mild obstructive hydrocephalus. 4. Extensive chronic small vessel ischemic disease.    Mri and Mra Brain Wo Contrast 02/05/2016 IMPRESSION: MRI HEAD: Evolving RIGHT thalamic hematoma. Effaced 3rd ventricle with mild acute hydrocephalus. Bilateral sub centimeter acute watershed territory infarcts. Moderate to severe white matter changes consistent with interstitial edema and chronic small vessel ischemic disease. MRA HEAD: No acute vascular process. Mildly ectatic basilar artery.   2D echo  - Left ventricle: The cavity size was normal. Wall thickness was increased in a pattern of moderate LVH. Systolic function was  normal. The estimated ejection fraction was in the range of 55% to 60%. Wall motion was normal; there were no regional wall motion abnormalities. Doppler parameters are consistent with restrictive physiology, indicative of decreased left  ventricular diastolic compliance and/or increased left atrial pressure. Impressions:   Extremely limited; definity used; normal LV systolic function;  moderate LVH; restrictive filling.  CUS - Bilateral 1-39% ICA stenosis, antegrade vertebral flow. Difficult exam due to patient cooperation, snoring and body habitus.  LE venous doppler - No evidence of deep vein thrombosis or baker's cysts bilaterally.  TCD - no vasospasm   PHYSICAL EXAM  Temp:  [98 F (36.7 C)-100 F (37.8 C)] 98.9 F (37.2 C) (02/14 1838) Pulse Rate:  [71-92] 92 (02/14 1838) Resp:  [18-20] 20 (02/14 1838) BP: (112-175)/(62-91) 160/91 (02/14 1838) SpO2:  [95 %-99 %] 97 % (02/14 1838)  General - morbid obesity, well developed, depressed mood, awake alert.  Ophthalmologic - Fundi not visualized due to noncooperation  Cardiovascular - Regular rate and rhythm.  neuro -  eyes open, awake and alert. Follows some simple commands. Not answer questions or have speech output, but able to repeat single words on command. Not cooperative on verbal communications. PERRL, eyes moving to both sides, left facial droop. Tongue in middle position. On pain stimulation RUE and RLE purposeful movement, LLE withdrawal to pain, and LUE 0/5. DTR 1+ and no Babinski. Sensation, coordination and gait not cooperative.    ASSESSMENT/PLAN [name] is a 46 y.o. male with history of hypertension and hyperlipidemia presenting with headache and left-sided weakness. CT showed a right thalamic intracerebral hemorrhage in setting of elevated blood pressure 211/115. He was started on IV Cardene admitted to the neuro ICU.  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   Resultant  left hemiparesis and left facial droop, AMS  CT head x 2 evolving right thalamic ICH with mild obstructive hydrocephalus.   MRI - evolving RIGHT thalamic hematoma. Mild acute hydrocephalus. Bilateral sub centimeter acute watershed  territory infarcts.   MRA  unremarkable   MR repeat - increase bilateral watershed territory infarcts.  Carotid Doppler  unremarkable   2D Echo  EF 55-60%   EEG abnormal due to diffuse slowing of the waking background  LE venous doppler - no DVT  LDL 78  HgbA1c 7.9  Heparin subcutaneous for VTE prophylaxis DIET DYS 2 Room service appropriate? Yes; Fluid consistency: Thin  aspirin 81 mg daily prior to admission, now on No antithrombotic secondary to hemorrhage  Ritalin to 10 mg at 0600 and 12 noon for arousal  Ongoing aggressive stroke risk factor management  Therapy recommendations:  CIR   Disposition:  Transfer to floor  Bilateral watershed territory infarcts - may be due to strict BP control and hypoxia/hypercapnia from OSA.  May explain mental status changes  May be related to strict BP control and hypoxia/hypercarbia from OSA  BP goal < 160   TCD no sign of vasospasm  CPAP PRN at night - pt refused last night  Hypertension  BP better controlled  SBP goal < 160   on po meds with labetalol 300 tid, amlodipine 10 and hydralazine 50 tid  Depression  Depressed mood  Not interested in communication with providers  Put on zoloft 50mg    obstructive sleep apnea  hypoxia/hypercapnia from OSA  Uses CPAP at home at night  CPAP here at night PRN  Hyperlipidemia  Home meds:  No statin  LDL 78  Added Lipitor 10  Continue statin on discharge  Diabetes  Hyperglycemia still not in good control,   Check CBGs  SSI  HgbA1c 7.9 , goal < 7.0  Lantus to 10 units twice a day   Dysphagia   Passed swallow  On diet   IVF @ 50  CKD, stage III  Creatinine 2.14->2.22-> 1.93->1.87 -> 1.52 -> 1.52 ->1.56->1.52  Likely due to uncontrolled HTN and DM  On IV fluid  Other Stroke Risk Factors  Marijuana user, has 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   Morbid Obesity, Body mass index is 49.34 kg/m., recommend weight loss, diet and exercise as appropriate    Other Active Problems  Right cheek swelling -> resolved  Leukocytosis - 12.5 -> 10.2 -> 10.6 -> 9.5 -> 10.0 -> 9.4 (afebrile)  Hospital day # 16  Rosalin Hawking, MD PhD Stroke Neurology 02/19/2016 8:35 PM    To contact Stroke Continuity provider, please refer to [email or web address] After hours, contact General Neurology

## 2016-02-19 NOTE — Progress Notes (Addendum)
Attempted to feed patient the remainder of his lunch, Patient refused-shaking his head from side to side and using his tongue to push food back. Patient is non-verbal, no signs and symptoms of pain/discomfort observed. Will continue to monitor.

## 2016-02-19 NOTE — Progress Notes (Signed)
Patient refused his meds this afternoon. Lips tight and groaning, refusing to open mouth.

## 2016-02-19 NOTE — Progress Notes (Signed)
Physical Therapy Treatment Patient Details Name: Adam Vincent MRN: [id number] DOB: 30-Jun-1970 Today's Date: 02/19/2016    History of Present Illness This 46 y.o. male admitted with generalized weakness and cough. Head CT-Evolving RIGHT thalamic hemorrhage. MRI-Evolving RIGHT thalamic hematoma. Effaced 3rd ventricle with mild acute hydrocephalus. Bilateral sub centimeter acute watershed territory infarcts. PMH includes:  CAD with multiple stents, COPD on 2L 02, HTN, CKDIII, parosysmal A-fib, h/o CVA, syncope, PNA, h/o substance abuse, conversion, anxiety disorder, cognitive impairment.     PT Comments    Pt unable to participate with anything during treatment today; pt appears to be very lethargic and refuses to attempt any muscle movement throughout bed mobility. SPTA/OT made several attempts to get patient to engage limbs to help with rolling but no visible signs of muscle activation present. At this time, treatment team changed plan from sitting EOB with assistance to moving patient into chair with maximove for patient and staff safety. Total A+2 for bed mobility for limb management and trunk support with third person to place maximove under patient's trunk. He opened his eyes intermittently but overall unresponsive. Pt able Nurse is skeptical of malingering behavior since transition to new room.     Follow Up Recommendations  CIR;Supervision/Assistance - 24 hour      Equipment Recommendations  Other (comment) (TBA at next venue)    Recommendations for Other Services Rehab consult     Precautions / Restrictions Precautions Precaution Comments: left hemiplegia Restrictions Weight Bearing Restrictions: No    Mobility  Bed Mobility Overal bed mobility: Needs Assistance Bed Mobility: Rolling;Supine to Sit Rolling: Total assist;+2 for physical assistance   Supine to sit: Total assist;+2 for physical assistance;+2 for safety/equipment     General bed mobility comments: Pt unable  to assist with rolling. Total +2 to roll for limb management and third person to apply maximove pad under pt's trunk. Due to pt inability to assist with rolling and moving limbs, multiple bouts of rolling occurred for correct placement and adjustment of maximove pad.   Transfers Overall transfer level: Needs assistance Equipment used:  (Maximove)             General transfer comment: Pt unable to engage in any mobility; SPTA/OT decided to use maximove for patient and staff safety to get to chair.   Ambulation/Gait                 Stairs            Wheelchair Mobility    Modified Rankin (Stroke Patients Only) Modified Rankin (Stroke Patients Only) Pre-Morbid Rankin Score: No symptoms Modified Rankin: Severe disability     Balance Overall balance assessment: Needs assistance   Sitting balance-Leahy Scale: Zero Sitting balance - Comments: Pt able to sit in chair but no further assessment done due to pt's inability to participate; likely zero sitting balance.                            Cognition Arousal/Alertness: Lethargic Behavior During Therapy: Flat affect Overall Cognitive Status: Difficult to assess                 General Comments: Pt awake and watching tv when SPTA/OT entered room. Pt unable to follow commands due to lethargic, flat affect presence. Intermittent eye closing, possibly due to being tired. and required sternal rub and addressing pt with name to open eyes.  Lethargic state could be from being tired, but unable  to respond when asked. Would respond to occassional question with a grunt but never used yes or no responses. Difficult for pt to engage with bed mobility throughout treatment    Exercises Other Exercises Other Exercises: B heel cord stretching 15-20" holds x4 each. Great toe extension stretching 15" x3 each. No facial grimacing noted.     General Comments        Pertinent Vitals/Pain Pain Assessment: No/denies  pain (pt unable to verbalize anything during treatment. No apparent signs of pain with movement)    Home Living                      Prior Function            PT Goals (current goals can now be found in the care plan section) Acute Rehab PT Goals Patient Stated Goal: none stated Potential to Achieve Goals: Poor Progress towards PT goals: Not progressing toward goals - comment (pt shows decline since last visit and unwilling to attempt any movement. Pt unable to engage in rolling and SPTA/OT deemed unsafe to attempt sitting pt EOB due to no trace of muscle activation)    Frequency    Min 3X/week      PT Plan Current plan remains appropriate    Co-evaluation PT/OT/SLP Co-Evaluation/Treatment: Yes Reason for Co-Treatment: Complexity of the patient's impairments (multi-system involvement);For patient/therapist safety PT goals addressed during session: Mobility/safety with mobility;Strengthening/ROM (Positioning and PROM)       End of Session   Activity Tolerance: Patient limited by lethargy;Patient limited by fatigue (unable to participate with any assistance) Patient left: in chair;with chair alarm set;with call bell/phone within reach     Time: 1511-1553 PT Time Calculation (min) (ACUTE ONLY): 42 min  Charges:  $Therapeutic Activity: 8-22 mins                    G Codes:      [facility] 29-Feb-2016, 4:38 PM [place], [place] Pager [phone]

## 2016-02-19 NOTE — Progress Notes (Signed)
Paged Dr. Katherine Roan- pt.'s [phone] and SS not given due to pt. not eating supper; and pt. sleepy/drowsy and not taking po meds; also wanted to know if Lantus 10 units should be given tonight. RTC and order given to hold Lantus tonight.

## 2016-02-20 ENCOUNTER — Inpatient Hospital Stay (HOSPITAL_COMMUNITY): Payer: Medicaid Other

## 2016-02-20 LAB — BASIC METABOLIC PANEL
ANION GAP: 10 (ref 5–15)
BUN: 30 mg/dL — ABNORMAL HIGH (ref 6–20)
CALCIUM: 9.1 mg/dL (ref 8.9–10.3)
CO2: 23 mmol/L (ref 22–32)
CREATININE: 1.28 mg/dL — AB (ref 0.61–1.24)
Chloride: 103 mmol/L (ref 101–111)
GFR calc Af Amer: >60 mL/min (ref >60)
Glucose, Bld: 115 mg/dL — ABNORMAL HIGH (ref 65–99)
Potassium: 4.3 mmol/L (ref 3.5–5.1)
SODIUM: 136 mmol/L (ref 135–145)

## 2016-02-20 LAB — CBC
HEMATOCRIT: 37.7 % — AB (ref 39.0–52.0)
Hemoglobin: 12 g/dL — ABNORMAL LOW (ref 13.0–17.0)
MCH: 27.3 pg (ref 26.0–34.0)
MCHC: 31.8 g/dL (ref 30.0–36.0)
MCV: 85.9 fL (ref 78.0–100.0)
Platelets: 303 10*3/uL (ref 150–400)
RBC: 4.39 MIL/uL (ref 4.22–5.81)
RDW: 14.9 % (ref 11.5–15.5)
WBC: 10.3 10*3/uL (ref 4.0–10.5)

## 2016-02-20 LAB — GLUCOSE, CAPILLARY
GLUCOSE-CAPILLARY: 108 mg/dL — AB (ref 65–99)
GLUCOSE-CAPILLARY: 121 mg/dL — AB (ref 65–99)
GLUCOSE-CAPILLARY: 131 mg/dL — AB (ref 65–99)
Glucose-Capillary: 117 mg/dL — ABNORMAL HIGH (ref 65–99)
Glucose-Capillary: 126 mg/dL — ABNORMAL HIGH (ref 65–99)
Glucose-Capillary: 164 mg/dL — ABNORMAL HIGH (ref 65–99)

## 2016-02-20 MED ORDER — INSULIN GLARGINE 100 UNIT/ML ~~LOC~~ SOLN
10.0000 [IU] | Freq: Every day | SUBCUTANEOUS | Status: DC
Start: 2016-02-20 — End: 2016-02-26
  Administered 2016-02-20 – 2016-02-25 (×6): 10 [IU] via SUBCUTANEOUS
  Filled 2016-02-20 (×6): qty 0.1

## 2016-02-20 NOTE — Progress Notes (Signed)
Attempted to give patient meds-Patient keeps eyes closed, mouth shut and lips tightened. No sign of pain/discomfort observed will continue to monitor

## 2016-02-20 NOTE — Progress Notes (Signed)
Attempted to feed patient this morning-He refused. Will continue to monitor

## 2016-02-20 NOTE — Progress Notes (Signed)
Tech attempted to feed patient by offering Pt some mash potatoes. Pt would not open mouth and eyes remained shut.

## 2016-02-20 NOTE — Progress Notes (Signed)
[facility] Progress Note     Subjective: According to MD and nursing staff patient is refusing to eat despite passing FEES and being placed on soft diet. Per nursing staff he is clamping his lips closed during most attempts to administer medications or feed. He will apparently eat meals and take some medications for his wife, but refuses to eat with most other hospital staff.  Objective: Vital signs in last 24 hours: Temp:  [98.1 F (36.7 C)-98.9 F (37.2 C)] 98.4 F (36.9 C) (02/15 1347) Pulse Rate:  [69-92] 70 (02/15 1347) Resp:  [20] 20 (02/15 1347) BP: (152-187)/(65-96) 187/96 (02/15 1347) SpO2:  [97 %-100 %] 100 % (02/15 1347) Last BM Date: 02/18/16  Intake/Output from previous day: 02/14 0701 - 02/15 0700 In: -  Out: 8841 [Urine:3425] Intake/Output this shift: Total I/O In: 0  Out: 750 [Urine:750]  PE: Gen:  Alert, NAD, not very interactive Pulm:  Normal effort  Abd: Soft, obese, non-tender  Ext:  No erythema, edema, or tenderness   Lab Results:   Recent Labs  02/19/16 0508 02/20/16 0446  WBC 9.4 10.3  HGB 11.1* 12.0*  HCT 36.0* 37.7*  PLT 284 303   BMET  Recent Labs  02/19/16 0508 02/20/16 0446  NA 139 136  K 4.6 4.3  CL 102 103  CO2 26 23  GLUCOSE 120* 115*  BUN 30* 30*  CREATININE 1.52* 1.28*  CALCIUM 8.9 9.1   PT/INR No results for input(s): LABPROT, INR in the last 72 hours. CMP     Component Value Date/Time   NA 136 02/20/2016 0446   K 4.3 02/20/2016 0446   CL 103 02/20/2016 0446   CO2 23 02/20/2016 0446   GLUCOSE 115 (H) 02/20/2016 0446   BUN 30 (H) 02/20/2016 0446   CREATININE 1.28 (H) 02/20/2016 0446   CALCIUM 9.1 02/20/2016 0446   PROT 7.3 02/08/2016 1342   ALBUMIN 2.9 (L) 02/08/2016 1342   AST 15 02/08/2016 1342   ALT 15 (L) 02/08/2016 1342   ALKPHOS 37 (L) 02/08/2016 1342   BILITOT 1.0 02/08/2016 1342   GFRNONAA >60 02/20/2016 0446   GFRAA >60 02/20/2016 0446   Lipase     Component Value Date/Time   LIPASE 65 (H) 02/09/2011 1215   Anti-infectives: Anti-infectives    Start     Dose/Rate Route Frequency Ordered Stop   02/14/16 0800  ceFAZolin (ANCEF) 3 g in dextrose 5 % 50 mL IVPB  Status:  Discontinued     3 g 130 mL/hr over 30 Minutes Intravenous To [facility] 02/13/16 1101 02/13/16 1337     Assessment/Plan Need for enteral access S/P Right thalamic hemorrhage  - Poor PO intake and risk for malnutrition despite passing swallow eval. and tolerating a SOFT diet - scheduled for PEG tomorrow 02/21/16 at 1200 - Will attempt contacting patients wife prior to procedure to confirm her consent. It will be important for patient and family to understand that, should the patients' PO intake improve over the next few days/weeks, the PEG tube will remain in place for a minimum of 6 weeks before it is safe to remove.    LOS: 17 days    [facility] 02/20/2016, 3:30 PM Pager: [phone] Consults: [phone] Mon-Fri 7:00 am-4:30 pm Sat-Sun 7:00 am-11:30 am

## 2016-02-20 NOTE — Progress Notes (Signed)
CSW continuing to search for an LOG SNF bed for patient. Will continue to follow.  Percell Locus Bethzy Hauck LCSWA [phone]

## 2016-02-20 NOTE — Progress Notes (Signed)
Patient's wife arrived at bedside saying "hello" to patient upon entering the room and patient responded "hey".  She was able to feed him some of the magic cup before leaving for the day. She states that she would be back in the morning. Will continue to monitor.

## 2016-02-20 NOTE — Progress Notes (Signed)
STROKE TEAM PROGRESS NOTE   SUBJECTIVE (INTERVAL HISTORY) No family is at bedside. Pt not cooperative with nurse and providers for exam, feeding, or medication, refused CPAP sp far. Discussed with wife over the phone and she stated that when wife or daughter at bedside, he was able to be fed some. Due to lack of nutrition access, he needs PEG tube for SNF placement.  Repeat MRI.    OBJECTIVE Temp:  [98.1 F (36.7 C)-98.8 F (37.1 C)] 98.5 F (36.9 C) (02/15 2100) Pulse Rate:  [70-86] 74 (02/15 2100) Cardiac Rhythm: Normal sinus rhythm (02/15 2015) Resp:  [17-20] 17 (02/15 1823) BP: (155-187)/(79-112) 183/104 (02/15 2140) SpO2:  [98 %-100 %] 98 % (02/15 2100)  CBC:   Recent Labs Lab 02/19/16 0508 02/20/16 0446  WBC 9.4 10.3  HGB 11.1* 12.0*  HCT 36.0* 37.7*  MCV 87.0 85.9  PLT 284 505    Basic Metabolic Panel:   Recent Labs Lab 02/19/16 0508 02/20/16 0446  NA 139 136  K 4.6 4.3  CL 102 103  CO2 26 23  GLUCOSE 120* 115*  BUN 30* 30*  CREATININE 1.52* 1.28*  CALCIUM 8.9 9.1    Lipid Panel:     Component Value Date/Time   CHOL 153 02/04/2016 0742   TRIG 174 (H) 02/04/2016 0742   HDL 40 (L) 02/04/2016 0742   CHOLHDL 3.8 02/04/2016 0742   VLDL 35 02/04/2016 0742   LDLCALC 78 02/04/2016 0742   HgbA1c:  Lab Results  Component Value Date   HGBA1C 7.8 (H) 02/06/2016   Urine Drug Screen:     Component Value Date/Time   LABOPIA POSITIVE (A) 02/04/2016 0530   COCAINSCRNUR NONE DETECTED 02/04/2016 0530   LABBENZ NONE DETECTED 02/04/2016 0530   AMPHETMU NONE DETECTED 02/04/2016 0530   THCU POSITIVE (A) 02/04/2016 0530   LABBARB NONE DETECTED 02/04/2016 0530      IMAGING I have personally reviewed the radiological images below and agree with the radiology interpretations.  Ct Head Wo Contrast 02/11/2016 1. Resolving hydrocephalus with expected evolution of right thalamic hemorrhage and decreased mass effect on the third ventricle. 2. Diffuse  periventricular and subcortical white matter hypoattenuation representing a combination of watershed territory infarcts and chronic microvascular ischemic change. These cannot be differentiated on the basis of CT. 3. No acute cortical infarct.  02/06/2016 Evolving RIGHT thalamic hemorrhage. Unchanged mild obstructive hydrocephalus. Chronic changes include old LEFT thalamus lacunar infarct and moderate to severe chronic small vessel ischemic disease better characterized on recent MRI.   02/04/2016 Evolving 11 x 30 mm RIGHT caudal thalamic hematoma resulting in mild obstructive hydrocephalus. Severe white matter changes most compatible with chronic hypertensive encephalopathy, recommend MRI of the head on nonemergent basis for further characterization.   02/03/2016 Interval development of intraparenchymal hemorrhage seen in the right thalamus with surrounding white matter edema. Mild dilatation of lateral ventricles is noted.   [name] 02/06/2016 IMPRESSION: 1. Bilateral acute cerebral watershed infarcts, progressed from the recent MRI. 2. Punctate acute infarct in the right superior cerebellar peduncle. 3. Unchanged right thalamic hemorrhage with surrounding edema and mild obstructive hydrocephalus. 4. Extensive chronic small vessel ischemic disease.    Mri and Mra Brain Wo Contrast 02/05/2016 IMPRESSION: MRI HEAD: Evolving RIGHT thalamic hematoma. Effaced 3rd ventricle with mild acute hydrocephalus. Bilateral sub centimeter acute watershed territory infarcts. Moderate to severe white matter changes consistent with interstitial edema and chronic small vessel ischemic disease. MRA HEAD: No acute vascular process. Mildly ectatic basilar artery.  2D echo  - Left ventricle: The cavity size was normal. Wall thickness was increased in a pattern of moderate LVH. Systolic function was normal. The estimated ejection fraction was in the range of 55% to 60%. Wall motion was normal; there were no  regional wall motion abnormalities. Doppler parameters are consistent with restrictive physiology, indicative of decreased left ventricular diastolic compliance and/or increased left atrial pressure. Impressions:   Extremely limited; definity used; normal LV systolic function;  moderate LVH; restrictive filling.  CUS - Bilateral 1-39% ICA stenosis, antegrade vertebral flow. Difficult exam due to patient cooperation, snoring and body habitus.  LE venous doppler - No evidence of deep vein thrombosis or baker's cysts bilaterally.  TCD - no vasospasm  [name] 02/20/2016 IMPRESSION: 1. No new intracranial abnormality. 2. Expected evolution of subacute hematoma in the right thalamus. Vasogenic edema is decreased and ventriculomegaly on prior study is resolved. 3. No progression of deep cerebral watershed and right superior cerebellar peduncle infarcts. 4. Advanced for age chronic microvascular disease. 5. New and prominent sinusitis on the right. Has the patient had nasal intubation on the right?     PHYSICAL EXAM  Temp:  [98.1 F (36.7 C)-98.8 F (37.1 C)] 98.5 F (36.9 C) (02/15 2100) Pulse Rate:  [70-86] 74 (02/15 2100) Resp:  [17-20] 17 (02/15 1823) BP: (155-187)/(79-112) 183/104 (02/15 2140) SpO2:  [98 %-100 %] 98 % (02/15 2100)  General - morbid obesity, well developed, depressed mood, awake alert.  Ophthalmologic - Fundi not visualized due to noncooperation  Cardiovascular - Regular rate and rhythm.  neuro -  eyes open, awake and alert. Tracking objects but not cooperative on exam and not answer questions or have speech output, but able to repeat single words on command when wife is at bedside. Not cooperative on verbal communications. PERRL, eyes moving to both sides, left facial droop. Tongue in middle position. On pain stimulation RUE and RLE purposeful movement, LLE withdrawal to pain, and LUE 0/5. DTR 1+ and no Babinski. Sensation, coordination and gait not  cooperative.    ASSESSMENT/PLAN [name] is a 46 y.o. male with history of hypertension and hyperlipidemia presenting with headache and left-sided weakness. CT showed a right thalamic intracerebral hemorrhage in setting of elevated blood pressure 211/115. He was started on IV Cardene admitted to the neuro ICU.  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   Resultant  left hemiparesis and left facial droop, AMS  CT head x 2 evolving right thalamic ICH with mild obstructive hydrocephalus.   MRI 02/05/16 - evolving RIGHT thalamic hematoma. Mild acute hydrocephalus. Bilateral sub centimeter acute watershed territory infarcts.   MR repeat 02/06/16 - increase bilateral watershed territory infarcts.  MRI 02/20/16 - Expected evolution of subacute hematoma in the right thalamus.  Ventriculomegaly on prior study is resolved. No progression of deep cerebral watershed   MRA  unremarkable   Carotid Doppler  unremarkable   2D Echo  EF 55-60%   EEG abnormal due to diffuse slowing of the waking background  LE venous doppler - no DVT  LDL 78  HgbA1c 7.9  Heparin subcutaneous for VTE prophylaxis DIET DYS 2 Room service appropriate? Yes; Fluid consistency: Thin  aspirin 81 mg daily prior to admission, now on No antithrombotic secondary to hemorrhage  Ritalin to 10 mg at 0600 and 12 noon for arousal  Ongoing aggressive stroke risk factor management  Therapy recommendations:  SNF   Disposition:  pending  Bilateral watershed territory infarcts -  may be due to strict BP control and hypoxia/hypercapnia from OSA.  May explain mental status changes  May be related to strict BP control and hypoxia/hypercarbia from OSA  BP goal normotensive   TCD no sign of vasospasm  CPAP PRN at night - pt refused   Repeat MRI 02/20/16 showed no progression of previous watershed infarcts  Hypertension  BP better controlled  SBP goal normotensive now   on po meds  with labetalol 300 tid, amlodipine 10 and hydralazine 50 tid  Depression  Depressed mood  Not interested in communication with providers  Put on zoloft 50mg    obstructive sleep apnea  hypoxia/hypercapnia from OSA  Uses CPAP at home at night  CPAP here at night PRN - pt so far refused.  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 After hours, contact General Neurology

## 2016-02-20 NOTE — Progress Notes (Signed)
Physical Therapy Treatment Patient Details Name: Adam Vincent MRN: [id number] DOB: 07/30/1970 Today's Date: 02/20/2016    History of Present Illness Pt is a 46 y.o. male who presented to the ED with L side weakness. CT revealign intraparenchymal hemorrhage in R thalamus and surrounding white matter edema. Also with B sub centimeter acute watershed territory infarcts. CT on 2/6 revealing resolving hydrocephalus with expected evolution of R thalamic hemorrhage and decreased mass effect on the third ventricle. PMH: diabetes mellitus, hypertension.    PT Comments    Pt progressing slowly towards physical therapy goals. Pt alert and with eyes open during session today. Was able to participate in some EOB sitting balance activity with max to total assist for trunk control. By end of session, pt following simple commands inconsistently. Do not feel that pt is appropriate for CIR level rehab at this time, and discharge disposition updated to reflect this. Pt will benefit from continued skilled therapy services at the SNF level upon d/c. Will continue to follow acutely.  Follow Up Recommendations  SNF;Supervision/Assistance - 24 hour     Equipment Recommendations  Other (comment) (TBD by next venue of care)    Recommendations for Other Services       Precautions / Restrictions Precautions Precautions: Fall Precaution Comments: left hemiplegia Restrictions Weight Bearing Restrictions: No    Mobility  Bed Mobility Overal bed mobility: Needs Assistance Bed Mobility: Rolling;Supine to Sit Rolling: Total assist;+2 for physical assistance   Supine to sit: Total assist;+2 for physical assistance;+2 for safety/equipment;HOB elevated Sit to supine: Total assist;+2 for physical assistance;+2 for safety/equipment   General bed mobility comments: Hand-over-hand assist required for extremity positioning, and step-by-step cues provided for transition to EOB. Total assist essentially provided with  little to no effort from pt. Bed pad used to assist in positioning.   Transfers                    Ambulation/Gait                 Stairs            Wheelchair Mobility    Modified Rankin (Stroke Patients Only) Modified Rankin (Stroke Patients Only) Pre-Morbid Rankin Score: No symptoms Modified Rankin: Severe disability     Balance Overall balance assessment: Needs assistance Sitting-balance support: Feet supported;Single extremity supported Sitting balance-Leahy Scale: Zero Sitting balance - Comments: Pt with therapist providing posterior support during EOB activity. Pt requiring max to total assist for trunk support and demonstrated no attempt to recover when support was suddenly removed.  Postural control: Posterior lean                          Cognition Arousal/Alertness: Lethargic Behavior During Therapy: Flat affect Overall Cognitive Status: Difficult to assess Area of Impairment: Attention;Following commands;Awareness;Problem solving   Current Attention Level: Selective   Following Commands: Follows one step commands inconsistently;Follows one step commands with increased time   Awareness: Intellectual Problem Solving: Slow processing;Decreased initiation;Difficulty sequencing;Requires verbal cues;Requires tactile cues      Exercises Other Exercises Other Exercises: At EOB, pt assisted into lateral propped sitting onto elbow x2 on R and x2 on L. Total assist provided for return to midline when propped on the R side, and max assist provided for return to midline when propped onto the L elbow.     General Comments General comments (skin integrity, edema, etc.): Initially not following commands, but later in session demonstrated  bilateral grip with cueing, and wiggling of toes with cueing.       Pertinent Vitals/Pain Pain Location: Pt withdrawing hand and foot away from painful stimuli. Slightly delayed and muted pain response.     Home Living                      Prior Function            PT Goals (current goals can now be found in the care plan section) Acute Rehab PT Goals Patient Stated Goal: none stated PT Goal Formulation: Patient unable to participate in goal setting Time For Goal Achievement: 03/12/16 Potential to Achieve Goals: Fair Progress towards PT goals: Progressing toward goals    Frequency    Min 3X/week      PT Plan Discharge plan needs to be updated    Co-evaluation             End of Session   Activity Tolerance: Patient limited by lethargy;Patient limited by fatigue Patient left: in bed;with call bell/phone within reach;with family/visitor present     Time: 7471-5953 PT Time Calculation (min) (ACUTE ONLY): 32 min  Charges:  $Gait Training: 23-37 mins                    G Codes:      Thelma Comp 2016/03/12, 2:22 PM   Rolinda Roan, PT, DPT Acute Rehabilitation Services Pager: [phone]

## 2016-02-20 NOTE — Progress Notes (Signed)
SLP Cancellation Note  Patient Details Name: Adam Vincent MRN: [id number] DOB: Mar 03, 1970   Cancelled treatment:       Reason Eval/Treat Not Completed: Patient declined, no reason specified; nursing stated he is refusing medications/eating, but is more responsive (talking) when family is present; may re-attempt as SLP schedule permits when family present to increase likelihood of participation with [facility]   ADAMS,PAT, M.S., CCC-SLP 02/20/2016, 2:24 PM

## 2016-02-20 NOTE — Progress Notes (Signed)
Patient is uncooperative during assessment-will not follow command. Open eyes upon entering room but closes eyes and not responding.

## 2016-02-20 NOTE — Progress Notes (Signed)
Patient's daughter in the room with patient, when asked which daughter she was, patient was able to identify her  As "Tiff". Daughter nodes in acknowledgement that she is "Tiff" Patient spoke to her in one-two answers, speech- is  dysarthric but able to understand Patient's responses. Daughter is currently feeding patient and he is responding very well to her. She is encouraged to give him the magic cup because it is protein packed. Will continue to monitor.

## 2016-02-20 NOTE — Progress Notes (Signed)
I spoke with pt's wife by phone to discuss his rehab potential. Pt currently not at a level for inpt rehab. She states Dr. Erlinda Hong has contacted her today and plans PEG and repeat MRI. Pt at this level will need SNF. I continue to follow to assess for improvements. [phone]

## 2016-02-21 LAB — CBC
HCT: 38.8 % — ABNORMAL LOW (ref 39.0–52.0)
Hemoglobin: 12.4 g/dL — ABNORMAL LOW (ref 13.0–17.0)
MCH: 27.3 pg (ref 26.0–34.0)
MCHC: 32 g/dL (ref 30.0–36.0)
MCV: 85.3 fL (ref 78.0–100.0)
PLATELETS: 312 10*3/uL (ref 150–400)
RBC: 4.55 MIL/uL (ref 4.22–5.81)
RDW: 14.4 % (ref 11.5–15.5)
WBC: 9.5 10*3/uL (ref 4.0–10.5)

## 2016-02-21 LAB — GLUCOSE, CAPILLARY
GLUCOSE-CAPILLARY: 103 mg/dL — AB (ref 65–99)
GLUCOSE-CAPILLARY: 113 mg/dL — AB (ref 65–99)
GLUCOSE-CAPILLARY: 115 mg/dL — AB (ref 65–99)
GLUCOSE-CAPILLARY: 131 mg/dL — AB (ref 65–99)
GLUCOSE-CAPILLARY: 161 mg/dL — AB (ref 65–99)
Glucose-Capillary: 204 mg/dL — ABNORMAL HIGH (ref 65–99)

## 2016-02-21 LAB — BASIC METABOLIC PANEL
Anion gap: 12 (ref 5–15)
BUN: 24 mg/dL — AB (ref 6–20)
CO2: 25 mmol/L (ref 22–32)
CREATININE: 1.29 mg/dL — AB (ref 0.61–1.24)
Calcium: 9.2 mg/dL (ref 8.9–10.3)
Chloride: 98 mmol/L — ABNORMAL LOW (ref 101–111)
GFR calc Af Amer: >60 mL/min (ref >60)
GLUCOSE: 102 mg/dL — AB (ref 65–99)
POTASSIUM: 4 mmol/L (ref 3.5–5.1)
Sodium: 135 mmol/L (ref 135–145)

## 2016-02-21 NOTE — Progress Notes (Signed)
Speech Language Pathology Treatment: Dysphagia;Cognitive-Linquistic  Patient Details Name: Adam Vincent MRN: [id number] DOB: 1970/07/21 Today's Date: 02/21/2016 Time: 9371-6967 SLP Time Calculation (min) (ACUTE ONLY): 17 min  Assessment / Plan / Recommendation Clinical Impression  SLP arrived at bedside as wife was telling RN she wanted to postpone the PEG tube to see if pt could try eating a more liberalized diet including foods from home. Absolutely agreeable to this plan and immediately changed pts diet to regular textures to allow more choices from the menu. Pts wife had some chicken alfredo she made at home that she chopped up nicely and SLP was able to feed pt foods with any resistance from pt. Pt was able to masticate regular solids though encouraged family to check right buccal cavity for residuals. Pt may attempt any foods from menu or home with assistance from staff or family. Hopeful to increase acceptance of PO. He may need nutritional supplements.   Pt also willing to attempt functional communication tasks, extending phonation to more purposeful "mmhmm" for assent to y/n questions and also nodded yes x1 more spontaneously to question.   To clarify, pts family is making every effort to assist pt, offering appropriate cues for repetition with speech and they have a good understanding of the effort pt has to make to initiate speech, which can be very frustrating and disappointing to pt. Understandably he may turn head and close eyes to refuse foods and activities since he often cannot verbally refuse or express his more complex wants and needs. Will continue to follow   HPI HPI: This 46 y.o. male admitted with generalized weakness and cough. Head CT-Evolving RIGHT thalamic hemorrhage. MRI-Evolving RIGHT thalamic hematoma. Effaced 3rd ventricle with mild acute hydrocephalus. Bilateral sub centimeter acute watershed territory infarcts. PMH includes:  CAD with multiple stents, COPD on 2L 02,  HTN, CKDIII, parosysmal A-fib, h/o CVA, syncope, PNA, h/o substance abuse, conversion, anxiety disorder, cognitive impairment.       SLP Plan  Continue with current plan of care     Recommendations  Diet recommendations: Regular;Thin liquid Liquids provided via: Straw;Cup Medication Administration: Whole meds with liquid Supervision: Trained caregiver to feed patient;Full supervision/cueing for compensatory strategies Compensations: Minimize environmental distractions Postural Changes and/or Swallow Maneuvers: Seated upright 90 degrees                General recommendations: Rehab consult Oral Care Recommendations: Oral care BID Follow up Recommendations: Inpatient Rehab Plan: Continue with current plan of care       GO               [facility], [place] CCC-SLP [phone]  Lynann Beaver 02/21/2016, 10:04 AM

## 2016-02-21 NOTE — Progress Notes (Signed)
Nutrition Follow-up  DOCUMENTATION CODES:   Morbid obesity  INTERVENTION:  Continue Premier Protein Shakes BID, each supplement provides 160 kcal and 30 grams of protein  Monitor PO intake for adequacy   NUTRITION DIAGNOSIS:   Inadequate oral intake related to inability to eat as evidenced by NPO status.  Ongoing/improving  GOAL:   Patient will meet greater than or equal to 90% of their needs  unmet  MONITOR:   TF tolerance, I & O's, Diet advancement  REASON FOR ASSESSMENT:   NPO/Clear Liquid Diet    ASSESSMENT:   Pt with PMH of hypertension and hyperlipidemia presenting with headache and left-sided weakness. CT showed a right thalamic intracerebral hemorrhage in setting of elevated blood pressure  Pt ate very little again yesterday and surgery was consulted for PEG for nutrition and SNF placement. Pt was re-assessed by SLP this morning; SLP was able to feed patient chopped food from home. Pt was able to advance to a Regular diet. RD visited pt and his wife after lunch. Wife reports that patient at almost an entire container of chicken, shrimp, pasta, and broccoli as well as a large amount of fruit jello. Wife states that her daughter is planning to bring more food from home for dinner.   Labs reviewed.   Diet Order:  Diet regular Room service appropriate? Yes; Fluid consistency: Thin  Skin:  Reviewed, no issues  Last BM:  2/13  Height:   Ht Readings from Last 1 Encounters:  02/03/16 6\' 1"  (1.854 m)    Weight:   Wt Readings from Last 1 Encounters:  02/18/16 (!) 374 lb (169.6 kg)    Ideal Body Weight:  83.6 kg  BMI:  Body mass index is 49.34 kg/m.  Estimated Nutritional Needs:   Kcal:  2200-2400  Protein:  115-130 grams  Fluid:  >2.2 L/day  EDUCATION NEEDS:   No education needs identified at this time  Scarlette Ar RD, LDN, CSP Inpatient Clinical Dietitian Pager: [phone] After Hours Pager: [phone]

## 2016-02-21 NOTE — Progress Notes (Signed)
Patient placed on CPAP for the night without complication. RT will continue to monitor as needed. 

## 2016-02-22 LAB — BASIC METABOLIC PANEL
Anion gap: 9 (ref 5–15)
BUN: 29 mg/dL — AB (ref 6–20)
CALCIUM: 9 mg/dL (ref 8.9–10.3)
CO2: 25 mmol/L (ref 22–32)
CREATININE: 1.35 mg/dL — AB (ref 0.61–1.24)
Chloride: 101 mmol/L (ref 101–111)
GFR calc Af Amer: >60 mL/min (ref >60)
Glucose, Bld: 131 mg/dL — ABNORMAL HIGH (ref 65–99)
POTASSIUM: 4.2 mmol/L (ref 3.5–5.1)
SODIUM: 135 mmol/L (ref 135–145)

## 2016-02-22 LAB — CBC
HCT: 37.7 % — ABNORMAL LOW (ref 39.0–52.0)
Hemoglobin: 12.2 g/dL — ABNORMAL LOW (ref 13.0–17.0)
MCH: 27.5 pg (ref 26.0–34.0)
MCHC: 32.4 g/dL (ref 30.0–36.0)
MCV: 84.9 fL (ref 78.0–100.0)
PLATELETS: 351 10*3/uL (ref 150–400)
RBC: 4.44 MIL/uL (ref 4.22–5.81)
RDW: 14.5 % (ref 11.5–15.5)
WBC: 9.8 10*3/uL (ref 4.0–10.5)

## 2016-02-22 LAB — GLUCOSE, CAPILLARY
GLUCOSE-CAPILLARY: 128 mg/dL — AB (ref 65–99)
Glucose-Capillary: 184 mg/dL — ABNORMAL HIGH (ref 65–99)
Glucose-Capillary: 150 mg/dL — ABNORMAL HIGH (ref 65–99)
Glucose-Capillary: 109 mg/dL — ABNORMAL HIGH (ref 65–99)

## 2016-02-22 MED ORDER — INSULIN ASPART 100 UNIT/ML ~~LOC~~ SOLN
0.0000 [IU] | Freq: Three times a day (TID) | SUBCUTANEOUS | Status: DC
  Administered 2016-02-22: 2 [IU] via SUBCUTANEOUS
  Administered 2016-02-22: 3 [IU] via SUBCUTANEOUS
  Administered 2016-02-22: 2 [IU] via SUBCUTANEOUS
  Administered 2016-02-23: 3 [IU] via SUBCUTANEOUS
  Administered 2016-02-23 (×2): 2 [IU] via SUBCUTANEOUS
  Administered 2016-02-24: 5 [IU] via SUBCUTANEOUS
  Administered 2016-02-24 (×2): 2 [IU] via SUBCUTANEOUS
  Administered 2016-02-25: 3 [IU] via SUBCUTANEOUS
  Administered 2016-02-26: 5 [IU] via SUBCUTANEOUS

## 2016-02-22 MED ORDER — INSULIN ASPART 100 UNIT/ML ~~LOC~~ SOLN: 0.0000 [IU] | Freq: Three times a day (TID) | SUBCUTANEOUS | Status: DC

## 2016-02-22 NOTE — Progress Notes (Signed)
STROKE TEAM PROGRESS NOTE   SUBJECTIVE (INTERVAL HISTORY) No family is at bedside. As per nurse, family this morning was able to feed him breakfast. With family at bedside, he was able to work with speech specialist. Diet changed to regular diet. Family request to hold off PEG placement. Repeat MRI no change yesterday.    OBJECTIVE Temp:  [97.8 F (36.6 C)-99.8 F (37.7 C)] 97.8 F (36.6 C) (02/17 0052) Pulse Rate:  [74-87] 78 (02/17 0052) Cardiac Rhythm: Normal sinus rhythm (02/16 1900) Resp:  [18-20] 20 (02/17 0052) BP: (122-168)/(65-86) 147/86 (02/17 0052) SpO2:  [97 %-100 %] 100 % (02/17 0052)  CBC:   Recent Labs Lab 02/20/16 0446 02/21/16 0329  WBC 10.3 9.5  HGB 12.0* 12.4*  HCT 37.7* 38.8*  MCV 85.9 85.3  PLT 303 563    Basic Metabolic Panel:   Recent Labs Lab 02/20/16 0446 02/21/16 0329  NA 136 135  K 4.3 4.0  CL 103 98*  CO2 23 25  GLUCOSE 115* 102*  BUN 30* 24*  CREATININE 1.28* 1.29*  CALCIUM 9.1 9.2    Lipid Panel:     Component Value Date/Time   CHOL 153 02/04/2016 0742   TRIG 174 (H) 02/04/2016 0742   HDL 40 (L) 02/04/2016 0742   CHOLHDL 3.8 02/04/2016 0742   VLDL 35 02/04/2016 0742   LDLCALC 78 02/04/2016 0742   HgbA1c:  Lab Results  Component Value Date   HGBA1C 7.8 (H) 02/06/2016   Urine Drug Screen:     Component Value Date/Time   LABOPIA POSITIVE (A) 02/04/2016 0530   COCAINSCRNUR NONE DETECTED 02/04/2016 0530   LABBENZ NONE DETECTED 02/04/2016 0530   AMPHETMU NONE DETECTED 02/04/2016 0530   THCU POSITIVE (A) 02/04/2016 0530   LABBARB NONE DETECTED 02/04/2016 0530      IMAGING I have personally reviewed the radiological images below and agree with the radiology interpretations.  Ct Head Wo Contrast 02/11/2016 1. Resolving hydrocephalus with expected evolution of right thalamic hemorrhage and decreased mass effect on the third ventricle. 2. Diffuse periventricular and subcortical white matter hypoattenuation representing a  combination of watershed territory infarcts and chronic microvascular ischemic change. These cannot be differentiated on the basis of CT. 3. No acute cortical infarct.  02/06/2016 Evolving RIGHT thalamic hemorrhage. Unchanged mild obstructive hydrocephalus. Chronic changes include old LEFT thalamus lacunar infarct and moderate to severe chronic small vessel ischemic disease better characterized on recent MRI.   02/04/2016 Evolving 11 x 30 mm RIGHT caudal thalamic hematoma resulting in mild obstructive hydrocephalus. Severe white matter changes most compatible with chronic hypertensive encephalopathy, recommend MRI of the head on nonemergent basis for further characterization.   02/03/2016 Interval development of intraparenchymal hemorrhage seen in the right thalamus with surrounding white matter edema. Mild dilatation of lateral ventricles is noted.   [name] 02/06/2016 IMPRESSION: 1. Bilateral acute cerebral watershed infarcts, progressed from the recent MRI. 2. Punctate acute infarct in the right superior cerebellar peduncle. 3. Unchanged right thalamic hemorrhage with surrounding edema and mild obstructive hydrocephalus. 4. Extensive chronic small vessel ischemic disease.    Mri and Mra Brain Wo Contrast 02/05/2016 IMPRESSION: MRI HEAD: Evolving RIGHT thalamic hematoma. Effaced 3rd ventricle with mild acute hydrocephalus. Bilateral sub centimeter acute watershed territory infarcts. Moderate to severe white matter changes consistent with interstitial edema and chronic small vessel ischemic disease. MRA HEAD: No acute vascular process. Mildly ectatic basilar artery.   2D echo  - Left ventricle: The cavity size was normal. Wall thickness  was increased in a pattern of moderate LVH. Systolic function was normal. The estimated ejection fraction was in the range of 55% to 60%. Wall motion was normal; there were no regional wall motion abnormalities. Doppler parameters are consistent with  restrictive physiology, indicative of decreased left ventricular diastolic compliance and/or increased left atrial pressure. Impressions:   Extremely limited; definity used; normal LV systolic function;  moderate LVH; restrictive filling.  CUS - Bilateral 1-39% ICA stenosis, antegrade vertebral flow. Difficult exam due to patient cooperation, snoring and body habitus.  LE venous doppler - No evidence of deep vein thrombosis or baker's cysts bilaterally.  TCD - no vasospasm  [name] 02/20/2016 IMPRESSION: 1. No new intracranial abnormality. 2. Expected evolution of subacute hematoma in the right thalamus. Vasogenic edema is decreased and ventriculomegaly on prior study is resolved. 3. No progression of deep cerebral watershed and right superior cerebellar peduncle infarcts. 4. Advanced for age chronic microvascular disease. 5. New and prominent sinusitis on the right. Has the patient had nasal intubation on the right?     PHYSICAL EXAM  Temp:  [97.8 F (36.6 C)-99.8 F (37.7 C)] 97.8 F (36.6 C) (02/17 0052) Pulse Rate:  [74-87] 78 (02/17 0052) Resp:  [18-20] 20 (02/17 0052) BP: (122-168)/(65-86) 147/86 (02/17 0052) SpO2:  [97 %-100 %] 100 % (02/17 0052)  General - morbid obesity, well developed, depressed mood, awake alert.  Ophthalmologic - Fundi not visualized due to noncooperation  Cardiovascular - Regular rate and rhythm.  neuro -  eyes open, awake and alert. Tracking objects but not cooperative on exam and not answer questions or have speech output, but able to repeat single words on command when wife is at bedside. Not cooperative on verbal communications. PERRL, eyes moving to both sides, left facial droop. Tongue in middle position. On pain stimulation RUE and RLE purposeful movement, LLE withdrawal to pain, and LUE 0/5. DTR 1+ and no Babinski. Sensation, coordination and gait not cooperative.    ASSESSMENT/PLAN [name] is a 46 y.o. male with  history of hypertension and hyperlipidemia presenting with headache and left-sided weakness. CT showed a right thalamic intracerebral hemorrhage in setting of elevated blood pressure 211/115. He was started on IV Cardene admitted to the neuro ICU.  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   Resultant  left hemiparesis and left facial droop, AMS  CT head x 2 evolving right thalamic ICH with mild obstructive hydrocephalus.   MRI 02/05/16 - evolving RIGHT thalamic hematoma. Mild acute hydrocephalus. Bilateral sub centimeter acute watershed territory infarcts.   MR repeat 02/06/16 - increase bilateral watershed territory infarcts.  MRI 02/20/16 - Expected evolution of subacute hematoma in the right thalamus.  Ventriculomegaly on prior study is resolved. No progression of deep cerebral watershed   MRA  unremarkable   Carotid Doppler  unremarkable   2D Echo  EF 55-60%   EEG abnormal due to diffuse slowing of the waking background  LE venous doppler - no DVT  LDL 78  HgbA1c 7.9  Heparin subcutaneous for VTE prophylaxis Diet regular Room service appropriate? Yes; Fluid consistency: Thin  aspirin 81 mg daily prior to admission, now on No antithrombotic secondary to hemorrhage  Ritalin to 10 mg at 0600 and 12 noon for arousal  Ongoing aggressive stroke risk factor management  Therapy recommendations:  SNF   Disposition:  pending  Bilateral watershed territory infarcts - may be due to strict BP control and hypoxia/hypercapnia from OSA.  May  explain mental status changes  May be related to strict BP control and hypoxia/hypercarbia from OSA  BP goal normotensive   TCD no sign of vasospasm  CPAP PRN at night - pt refused   Repeat MRI 02/20/16 showed no progression of previous watershed infarcts  Hypertension  BP better controlled  SBP goal normotensive now   on po meds with labetalol 300 tid, amlodipine 10 and hydralazine 50  tid  Depression  Depressed mood  Not interested in communication with providers  Put on zoloft 50mg    obstructive sleep apnea  hypoxia/hypercapnia from OSA  Uses CPAP at home at night  CPAP here at night PRN  Hyperlipidemia  Home meds:  No statin  LDL 78  Added Lipitor 10  Continue statin on discharge  Diabetes  Hyperglycemia still not in good control,   Check CBGs  SSI  HgbA1c 7.9 , goal < 7.0  Lantus to 10 units Qhs  Dysphagia   Passed swallow  On diet  IVF @ 75  Pt was able to eat with family but not when family absent  Hold off PEG for now.  CKD, stage III  Creatinine 2.14->2.22-> 1.93->1.87 -> 1.52 -> 1.52 ->1.56->1.52->1.28->1.29  Likely due to uncontrolled HTN and DM  On IV fluid  Other Stroke Risk Factors  Marijuana user, has 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   Morbid Obesity, Body mass index is 49.34 kg/m., recommend weight loss, diet and exercise as appropriate   Other Active Problems  Right cheek swelling -> resolved  Leukocytosis - 12.5 -> 10.2 -> 10.6 -> 9.5 -> 10.0 -> 9.4 -> 10.3-> 9.5 (afebrile)  Hospital day # 79  Rosalin Hawking, MD PhD Stroke Neurology 02/22/2016 1:01 AM    To contact Stroke Continuity provider, please refer to [email or web address] After hours, contact General Neurology

## 2016-02-22 NOTE — Progress Notes (Signed)
Patient ate food brought in by family, rice and beans, and drank full protein plus supplement.

## 2016-02-22 NOTE — Plan of Care (Signed)
Problem: Education: Goal: Knowledge of disease or condition will improve Outcome: Not Met (add Reason) Patients current cognitive state is questionable. Patient able to blink twice for "yes" for simple questions about basic daily needs. Goal: Knowledge of patient specific risk factors addressed and post discharge goals established will improve Outcome: Not Met (add Reason) Patients current cognitive state is questionable. Patient able to blink twice for "yes" for simple questions about basic daily needs.  Problem: Self-Care: Goal: Ability to communicate needs accurately will improve Outcome: Progressing Patient will blink eyes twice for "yes" when asked yes or no questions and instructed to do so.  Problem: Nutrition: Goal: Dietary intake will improve Outcome: Progressing SLP approved for patients family to bring food from home in for patient to increase oral intake.

## 2016-02-22 NOTE — Progress Notes (Signed)
STROKE TEAM PROGRESS NOTE   SUBJECTIVE (INTERVAL HISTORY) No family is at bedside. He is alert and awake but not cooperative. Pending discharge to SNF. Clinically stable.   OBJECTIVE Temp:  [97.8 F (36.6 C)-99.8 F (37.7 C)] 98.2 F (36.8 C) (02/17 0518) Pulse Rate:  [76-87] 80 (02/17 0518) Cardiac Rhythm: Normal sinus rhythm (02/16 1900) Resp:  [18-20] 20 (02/17 0518) BP: (115-168)/(64-86) 115/64 (02/17 0518) SpO2:  [97 %-100 %] 97 % (02/17 0518)  CBC:   Recent Labs Lab 02/21/16 0329 02/22/16 0434  WBC 9.5 9.8  HGB 12.4* 12.2*  HCT 38.8* 37.7*  MCV 85.3 84.9  PLT 312 532    Basic Metabolic Panel:   Recent Labs Lab 02/21/16 0329 02/22/16 0434  NA 135 135  K 4.0 4.2  CL 98* 101  CO2 25 25  GLUCOSE 102* 131*  BUN 24* 29*  CREATININE 1.29* 1.35*  CALCIUM 9.2 9.0    Lipid Panel:     Component Value Date/Time   CHOL 153 02/04/2016 0742   TRIG 174 (H) 02/04/2016 0742   HDL 40 (L) 02/04/2016 0742   CHOLHDL 3.8 02/04/2016 0742   VLDL 35 02/04/2016 0742   LDLCALC 78 02/04/2016 0742   HgbA1c:  Lab Results  Component Value Date   HGBA1C 7.8 (H) 02/06/2016   Urine Drug Screen:     Component Value Date/Time   LABOPIA POSITIVE (A) 02/04/2016 0530   COCAINSCRNUR NONE DETECTED 02/04/2016 0530   LABBENZ NONE DETECTED 02/04/2016 0530   AMPHETMU NONE DETECTED 02/04/2016 0530   THCU POSITIVE (A) 02/04/2016 0530   LABBARB NONE DETECTED 02/04/2016 0530      IMAGING I have personally reviewed the radiological images below and agree with the radiology interpretations.  Ct Head Wo Contrast 02/11/2016 1. Resolving hydrocephalus with expected evolution of right thalamic hemorrhage and decreased mass effect on the third ventricle. 2. Diffuse periventricular and subcortical white matter hypoattenuation representing a combination of watershed territory infarcts and chronic microvascular ischemic change. These cannot be differentiated on the basis of CT. 3. No acute  cortical infarct.  02/06/2016 Evolving RIGHT thalamic hemorrhage. Unchanged mild obstructive hydrocephalus. Chronic changes include old LEFT thalamus lacunar infarct and moderate to severe chronic small vessel ischemic disease better characterized on recent MRI.   02/04/2016 Evolving 11 x 30 mm RIGHT caudal thalamic hematoma resulting in mild obstructive hydrocephalus. Severe white matter changes most compatible with chronic hypertensive encephalopathy, recommend MRI of the head on nonemergent basis for further characterization.   02/03/2016 Interval development of intraparenchymal hemorrhage seen in the right thalamus with surrounding white matter edema. Mild dilatation of lateral ventricles is noted.   [name] 02/06/2016 IMPRESSION: 1. Bilateral acute cerebral watershed infarcts, progressed from the recent MRI. 2. Punctate acute infarct in the right superior cerebellar peduncle. 3. Unchanged right thalamic hemorrhage with surrounding edema and mild obstructive hydrocephalus. 4. Extensive chronic small vessel ischemic disease.    Mri and Mra Brain Wo Contrast 02/05/2016 IMPRESSION: MRI HEAD: Evolving RIGHT thalamic hematoma. Effaced 3rd ventricle with mild acute hydrocephalus. Bilateral sub centimeter acute watershed territory infarcts. Moderate to severe white matter changes consistent with interstitial edema and chronic small vessel ischemic disease. MRA HEAD: No acute vascular process. Mildly ectatic basilar artery.   2D echo  - Left ventricle: The cavity size was normal. Wall thickness was increased in a pattern of moderate LVH. Systolic function was normal. The estimated ejection fraction was in the range of 55% to 60%. Wall motion was normal;  there were no regional wall motion abnormalities. Doppler parameters are consistent with restrictive physiology, indicative of decreased left ventricular diastolic compliance and/or increased left atrial pressure. Impressions:   Extremely  limited; definity used; normal LV systolic function;  moderate LVH; restrictive filling.  CUS - Bilateral 1-39% ICA stenosis, antegrade vertebral flow. Difficult exam due to patient cooperation, snoring and body habitus.  LE venous doppler - No evidence of deep vein thrombosis or baker's cysts bilaterally.  TCD - no vasospasm  [name] 02/20/2016 IMPRESSION: 1. No new intracranial abnormality. 2. Expected evolution of subacute hematoma in the right thalamus. Vasogenic edema is decreased and ventriculomegaly on prior study is resolved. 3. No progression of deep cerebral watershed and right superior cerebellar peduncle infarcts. 4. Advanced for age chronic microvascular disease. 5. New and prominent sinusitis on the right. Has the patient had nasal intubation on the right?     PHYSICAL EXAM  Temp:  [97.8 F (36.6 C)-99.8 F (37.7 C)] 98.2 F (36.8 C) (02/17 0518) Pulse Rate:  [76-87] 80 (02/17 0518) Resp:  [18-20] 20 (02/17 0518) BP: (115-168)/(64-86) 115/64 (02/17 0518) SpO2:  [97 %-100 %] 97 % (02/17 0518)   Stable exam today, not cooperative but alert and awake.   General - morbid obesity, well developed, depressed mood, awake alert.  Ophthalmologic - Fundi not visualized due to noncooperation  Cardiovascular - Regular rate and rhythm.  neuro -  eyes open, awake and alert. Tracking objects but not cooperative on exam and not answer questions or have speech output, but able to repeat single words on command when wife is at bedside. Not cooperative on verbal communications. PERRL, eyes moving to both sides, left facial droop. Tongue in middle position. On pain stimulation RUE and RLE purposeful movement, LLE withdrawal to pain, and LUE 0/5. DTR 1+ and no Babinski. Sensation, coordination and gait not cooperative.    ASSESSMENT/PLAN [name] is a 46 y.o. male with history of hypertension and hyperlipidemia presenting with headache and left-sided weakness.  CT showed a right thalamic intracerebral hemorrhage in setting of elevated blood pressure 211/115. He was started on IV Cardene admitted to the neuro ICU.  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   Resultant  left hemiparesis and left facial droop, AMS  CT head x 2 evolving right thalamic ICH with mild obstructive hydrocephalus.   MRI 02/05/16 - evolving RIGHT thalamic hematoma. Mild acute hydrocephalus. Bilateral sub centimeter acute watershed territory infarcts.   MR repeat 02/06/16 - increase bilateral watershed territory infarcts.  MRI 02/20/16 - Expected evolution of subacute hematoma in the right thalamus.  Ventriculomegaly on prior study is resolved. No progression of deep cerebral watershed   MRA  unremarkable   Carotid Doppler  unremarkable   2D Echo  EF 55-60%   EEG abnormal due to diffuse slowing of the waking background  LE venous doppler - no DVT  LDL 78  HgbA1c 7.9  Heparin subcutaneous for VTE prophylaxis Diet regular Room service appropriate? Yes; Fluid consistency: Thin  aspirin 81 mg daily prior to admission, now on No antithrombotic secondary to hemorrhage  Ritalin to 10 mg at 0600 and 12 noon for arousal  Ongoing aggressive stroke risk factor management  Therapy recommendations:  SNF   Disposition:  pending  Bilateral watershed territory infarcts - may be due to strict BP control and hypoxia/hypercapnia from OSA.  May explain mental status changes  May be related to strict BP control and hypoxia/hypercarbia from OSA  BP goal normotensive   TCD no sign of vasospasm  CPAP PRN at night - pt refused   Repeat MRI 02/20/16 showed no progression of previous watershed infarcts  Hypertension  BP better controlled  SBP goal normotensive now   on po meds with labetalol 300 tid, amlodipine 10 and hydralazine 50 tid  Depression  Depressed mood  Not interested in communication with providers  Put on zoloft 50mg     obstructive sleep apnea  hypoxia/hypercapnia from OSA  Uses CPAP at home at night  CPAP here at night PRN  Hyperlipidemia  Home meds:  No statin  LDL 78  Added Lipitor 10  Continue statin on discharge  Diabetes  Hyperglycemia still not in good control,   Check CBGs  SSI  HgbA1c 7.9 , goal < 7.0  Lantus to 10 units Qhs  Dysphagia   Passed swallow  On diet  IVF @ 77  Pt was able to eat with family but not when family absent  Hold off PEG for now.  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  I have personally obtained the history, evaluated lab date, reviewed imaging studies and agree with radiology interpretations.    Sarina Ill, MD Stroke Neurology [facility]    To contact Stroke Continuity provider, please refer to [email or web address] After hours, contact General Neurology

## 2016-02-22 NOTE — Progress Notes (Signed)
Patient's blood pressure 164/85, given scheduled AM blood pressure meds, f/u blood pressure taken, blood pressure now 113/53.

## 2016-02-22 NOTE — Progress Notes (Signed)
Patient not wanting CPAP at this time. Answered with blinks Two for yes and one for no, and was trying to pul mask off when placed on.Rn aware.

## 2016-02-23 LAB — GLUCOSE, CAPILLARY
GLUCOSE-CAPILLARY: 104 mg/dL — AB (ref 65–99)
GLUCOSE-CAPILLARY: 151 mg/dL — AB (ref 65–99)
Glucose-Capillary: 123 mg/dL — ABNORMAL HIGH (ref 65–99)
Glucose-Capillary: 128 mg/dL — ABNORMAL HIGH (ref 65–99)

## 2016-02-23 NOTE — Progress Notes (Signed)
Neurology notified patient increase in BP. RN administering 10 mg IV labetolol and  will continue to monitor patient.

## 2016-02-23 NOTE — Progress Notes (Signed)
STROKE TEAM PROGRESS NOTE   SUBJECTIVE (INTERVAL HISTORY) No family is at bedside. He is alert and awake but not cooperative. Pending discharge to SNF. Clinically stable.   OBJECTIVE Temp:  [97.7 F (36.5 C)-99 F (37.2 C)] 97.7 F (36.5 C) (02/18 0546) Pulse Rate:  [72-141] 74 (02/18 0656) Cardiac Rhythm: Normal sinus rhythm (02/17 1900) Resp:  [16-20] 18 (02/18 0546) BP: (110-174)/(53-106) 124/63 (02/18 0656) SpO2:  [96 %-100 %] 99 % (02/18 0546) Weight:  [159.2 kg (351 lb)] 159.2 kg (351 lb) (02/18 0329)  CBC:   Recent Labs Lab 02/21/16 0329 02/22/16 0434  WBC 9.5 9.8  HGB 12.4* 12.2*  HCT 38.8* 37.7*  MCV 85.3 84.9  PLT 312 867    Basic Metabolic Panel:   Recent Labs Lab 02/21/16 0329 02/22/16 0434  NA 135 135  K 4.0 4.2  CL 98* 101  CO2 25 25  GLUCOSE 102* 131*  BUN 24* 29*  CREATININE 1.29* 1.35*  CALCIUM 9.2 9.0    Lipid Panel:     Component Value Date/Time   CHOL 153 02/04/2016 0742   TRIG 174 (H) 02/04/2016 0742   HDL 40 (L) 02/04/2016 0742   CHOLHDL 3.8 02/04/2016 0742   VLDL 35 02/04/2016 0742   LDLCALC 78 02/04/2016 0742   HgbA1c:  Lab Results  Component Value Date   HGBA1C 7.8 (H) 02/06/2016   Urine Drug Screen:     Component Value Date/Time   LABOPIA POSITIVE (A) 02/04/2016 0530   COCAINSCRNUR NONE DETECTED 02/04/2016 0530   LABBENZ NONE DETECTED 02/04/2016 0530   AMPHETMU NONE DETECTED 02/04/2016 0530   THCU POSITIVE (A) 02/04/2016 0530   LABBARB NONE DETECTED 02/04/2016 0530      IMAGING I have personally reviewed the radiological images below and agree with the radiology interpretations.  Ct Head Wo Contrast 02/11/2016 1. Resolving hydrocephalus with expected evolution of right thalamic hemorrhage and decreased mass effect on the third ventricle. 2. Diffuse periventricular and subcortical white matter hypoattenuation representing a combination of watershed territory infarcts and chronic microvascular ischemic change. These  cannot be differentiated on the basis of CT. 3. No acute cortical infarct.  02/06/2016 Evolving RIGHT thalamic hemorrhage. Unchanged mild obstructive hydrocephalus. Chronic changes include old LEFT thalamus lacunar infarct and moderate to severe chronic small vessel ischemic disease better characterized on recent MRI.   02/04/2016 Evolving 11 x 30 mm RIGHT caudal thalamic hematoma resulting in mild obstructive hydrocephalus. Severe white matter changes most compatible with chronic hypertensive encephalopathy, recommend MRI of the head on nonemergent basis for further characterization.   02/03/2016 Interval development of intraparenchymal hemorrhage seen in the right thalamus with surrounding white matter edema. Mild dilatation of lateral ventricles is noted.   [name] 02/06/2016 IMPRESSION: 1. Bilateral acute cerebral watershed infarcts, progressed from the recent MRI. 2. Punctate acute infarct in the right superior cerebellar peduncle. 3. Unchanged right thalamic hemorrhage with surrounding edema and mild obstructive hydrocephalus. 4. Extensive chronic small vessel ischemic disease.    Mri and Mra Brain Wo Contrast 02/05/2016 IMPRESSION: MRI HEAD: Evolving RIGHT thalamic hematoma. Effaced 3rd ventricle with mild acute hydrocephalus. Bilateral sub centimeter acute watershed territory infarcts. Moderate to severe white matter changes consistent with interstitial edema and chronic small vessel ischemic disease. MRA HEAD: No acute vascular process. Mildly ectatic basilar artery.   2D echo  - Left ventricle: The cavity size was normal. Wall thickness was increased in a pattern of moderate LVH. Systolic function was normal. The estimated ejection fraction  was in the range of 55% to 60%. Wall motion was normal; there were no regional wall motion abnormalities. Doppler parameters are consistent with restrictive physiology, indicative of decreased left ventricular diastolic compliance and/or  increased left atrial pressure. Impressions:   Extremely limited; definity used; normal LV systolic function;  moderate LVH; restrictive filling.  CUS - Bilateral 1-39% ICA stenosis, antegrade vertebral flow. Difficult exam due to patient cooperation, snoring and body habitus.  LE venous doppler - No evidence of deep vein thrombosis or baker's cysts bilaterally.  TCD - no vasospasm  [name] 02/20/2016 IMPRESSION: 1. No new intracranial abnormality. 2. Expected evolution of subacute hematoma in the right thalamus. Vasogenic edema is decreased and ventriculomegaly on prior study is resolved. 3. No progression of deep cerebral watershed and right superior cerebellar peduncle infarcts. 4. Advanced for age chronic microvascular disease. 5. New and prominent sinusitis on the right. Has the patient had nasal intubation on the right?     PHYSICAL EXAM  Temp:  [97.7 F (36.5 C)-99 F (37.2 C)] 97.7 F (36.5 C) (02/18 0546) Pulse Rate:  [72-141] 74 (02/18 0656) Resp:  [16-20] 18 (02/18 0546) BP: (110-174)/(53-106) 124/63 (02/18 0656) SpO2:  [96 %-100 %] 99 % (02/18 0546) Weight:  [159.2 kg (351 lb)] 159.2 kg (351 lb) (02/18 0329)   Stable exam today, not cooperative but alert and awake. No changes on Exam.   General - morbid obesity, flat affect, awake alert.  Ophthalmologic - Fundi not visualized due to noncooperation  Cardiovascular - Regular rate and rhythm.  neuro -  eyes open, awake and alert. Tracking objects but not cooperative on exam, answering minimally. Not cooperative on verbal communications. PERRL, eyes moving to both sides, left facial droop. Tongue in middle position. On pain stimulation RUE and RLE purposeful movement, LLE withdrawal to pain, and LUE 0/5. DTR 1+ and toes equivocal. Sensation, coordination and gait not cooperative.    ASSESSMENT/PLAN Mr. MARKELLE NAJARIAN is a 46 y.o. male with history of hypertension and hyperlipidemia presenting with  headache and left-sided weakness. CT showed a right thalamic intracerebral hemorrhage in setting of elevated blood pressure 211/115. He was started on IV Cardene admitted to the neuro ICU.  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   Resultant  left hemiparesis and left facial droop, AMS  CT head x 2 evolving right thalamic ICH with mild obstructive hydrocephalus.   MRI 02/05/16 - evolving RIGHT thalamic hematoma. Mild acute hydrocephalus. Bilateral sub centimeter acute watershed territory infarcts.   MR repeat 02/06/16 - increase bilateral watershed territory infarcts.  MRI 02/20/16 - Expected evolution of subacute hematoma in the right thalamus.  Ventriculomegaly on prior study is resolved. No progression of deep cerebral watershed   MRA  unremarkable   Carotid Doppler  unremarkable   2D Echo  EF 55-60%   EEG abnormal due to diffuse slowing of the waking background  LE venous doppler - no DVT  LDL 78  HgbA1c 7.9  Heparin subcutaneous for VTE prophylaxis Diet regular Room service appropriate? Yes; Fluid consistency: Thin  aspirin 81 mg daily prior to admission, now on No antithrombotic secondary to hemorrhage  Ritalin to 10 mg at 0600 and 12 noon for arousal  Ongoing aggressive stroke risk factor management  Therapy recommendations:  SNF   Disposition:  pending  Bilateral watershed territory infarcts - may be due to strict BP control and hypoxia/hypercapnia from OSA.  May explain mental status changes  May be related to  strict BP control and hypoxia/hypercarbia from OSA  BP goal normotensive   TCD no sign of vasospasm  CPAP PRN at night - pt refused   Repeat MRI 02/20/16 showed no progression of previous watershed infarcts  Hypertension  BP better controlled  SBP goal normotensive now   on po meds with labetalol 300 tid, amlodipine 10 and hydralazine 50 tid  Depression  Depressed mood  Not interested in communication with  providers  Put on zoloft 50mg    obstructive sleep apnea  hypoxia/hypercapnia from OSA  Uses CPAP at home at night  CPAP here at night PRN  Hyperlipidemia  Home meds:  No statin  LDL 78  Added Lipitor 10  Continue statin on discharge  Diabetes  Hyperglycemia still not in good control,   Check CBGs  SSI  HgbA1c 7.9 , goal < 7.0  Lantus to 10 units Qhs  Dysphagia   Passed swallow  On diet  IVF @ 77  Pt was able to eat with family but not when family absent  Hold off PEG for now.  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  I have personally obtained the history, evaluated lab date, reviewed imaging studies and agree with radiology interpretations. Patient waiting for SNF.   Sarina Ill, MD Stroke Neurology [facility]   To contact Stroke Continuity provider, please refer to [email or web address] After hours, contact General Neurology

## 2016-02-23 NOTE — Progress Notes (Signed)
Patient's family stated bed feels hot.  Bed feels warm to touch, patient was switched to another specialty mattress bed.

## 2016-02-23 NOTE — Progress Notes (Signed)
   02/23/16 0320  Vitals  BP 136/84  MAP (mmHg) 95  BP Location Right Arm  BP Method Automatic  Patient Position (if appropriate) Lying  Pulse Rate 75  Pulse Rate Source Dinamap  Resp 18   10 mg IV labetalol effective. RN will continue to closely monitor patient.

## 2016-02-23 NOTE — Treatment Plan (Signed)
Spoke with Dr Jaynee Eagles who spoke with pt's family today regarding need for PEG placement which was scheduled for Monday. Apparently the patient's PO intake is improving and the family plans to bring food from home to help stimulate the patient's appetite. They would like to hold off on PEG placement for now. I will notify surgery.  Mikey Bussing PA-C [facility] Pager [phone] 02/23/2016, 2:55 PM

## 2016-02-23 NOTE — Discharge Summary (Signed)
Stroke Discharge Summary  Patient ID: Adam Vincent   MRN: [id number]      DOB: [birth year]  Date of Admission: 02/03/2016 Date of Discharge: 02/26/2016  Attending Physician:  Garvin Fila, MD, Stroke MD Consultant(s):    Dr. Elsworth Soho (pulmonary/intensive care), Delice Lesch, MD (Physical Medicine & Rehabtilitation), and Dr. 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  Past Medical History:  Diagnosis Date  . Diabetes mellitus   . Hypertension    Past Surgical History:  Procedure Laterality Date  . ABSCESS DRAINAGE     Left leg    Allergies as of 02/26/2016   No Known Allergies     Medication List    STOP taking these medications   aspirin 81 MG chewable tablet   hydrochlorothiazide 25 MG tablet Commonly known as:  HYDRODIURIL   ibuprofen 100 MG tablet Commonly known as:  ADVIL,MOTRIN     TAKE these medications   amLODipine 10 MG tablet Commonly known as:  NORVASC Take 1 tablet (10 mg total) by mouth daily. Start taking on:  02/27/2016   atorvastatin 10 MG tablet Commonly known as:  LIPITOR Take 1 tablet (10 mg total) by mouth daily at 6 PM.   hydrALAZINE 50 MG tablet Commonly known as:  APRESOLINE Take 1 tablet (50 mg total) by mouth every 8 (eight) hours.   insulin glargine 100 UNIT/ML injection Commonly known as:  LANTUS Inject 0.1 mLs (10 Units total) into the skin  at bedtime.   labetalol 300 MG tablet Commonly known as:  NORMODYNE Take 1 tablet (300 mg total) by mouth 3 (three) times daily.   metFORMIN 500 MG tablet Commonly known as:  GLUCOPHAGE Take 1 tablet (500 mg total) by mouth daily with breakfast. Start taking on:  02/27/2016 What changed:  how much to take   methylphenidate 10 MG tablet Commonly known as:  RITALIN Take 1 tablet (10 mg total) by mouth 2 (two) times daily.   multivitamin with minerals Tabs tablet Take 1 tablet by mouth daily. Start taking on:  02/27/2016   pantoprazole sodium 40 mg/20 mL Pack Commonly known as:  PROTONIX Take 20 mLs (40 mg total) by mouth daily. Start taking on:  02/27/2016   protein supplement shake Liqd Commonly known as:  PREMIER PROTEIN Take 325 mLs (11 oz total) by mouth 2 (two) times daily after a meal.   senna-docusate 8.6-50 MG tablet Commonly known as:  Senokot-S Take 1 tablet by mouth 2 (two) times daily.   sertraline 50 MG tablet Commonly known as:  ZOLOFT Take 1 tablet (50 mg total) by mouth daily. Start taking on:  02/27/2016       LABORATORY STUDIES CBC    Component Value Date/Time   WBC 9.8 02/22/2016 0434   RBC 4.44 02/22/2016 0434   HGB 12.2 (L)  02/22/2016 0434   HCT 37.7 (L) 02/22/2016 0434   PLT 351 02/22/2016 0434   MCV 84.9 02/22/2016 0434   MCH 27.5 02/22/2016 0434   MCHC 32.4 02/22/2016 0434   RDW 14.5 02/22/2016 0434   LYMPHSABS 1.7 02/03/2016 1542   MONOABS 0.7 02/03/2016 1542   EOSABS 0.2 02/03/2016 1542   BASOSABS 0.0 02/03/2016 1542   CMP    Component Value Date/Time   NA 135 02/22/2016 0434   K 4.2 02/22/2016 0434   CL 101 02/22/2016 0434   CO2 25 02/22/2016 0434   GLUCOSE 131 (H) 02/22/2016 0434   BUN 29 (H) 02/22/2016 0434   CREATININE 1.35 (H) 02/22/2016 0434   CALCIUM 9.0 02/22/2016 0434   PROT 7.3 02/08/2016 1342   ALBUMIN 2.9 (L) 02/08/2016 1342   AST 15 02/08/2016 1342   ALT 15 (L) 02/08/2016 1342   ALKPHOS 37 (L) 02/08/2016 1342    BILITOT 1.0 02/08/2016 1342   GFRNONAA >60 02/22/2016 0434   GFRAA >60 02/22/2016 0434   COAGS Lab Results  Component Value Date   INR 1.04 02/03/2016   Lipid Panel    Component Value Date/Time   CHOL 153 02/04/2016 0742   TRIG 174 (H) 02/04/2016 0742   HDL 40 (L) 02/04/2016 0742   CHOLHDL 3.8 02/04/2016 0742   VLDL 35 02/04/2016 0742   LDLCALC 78 02/04/2016 0742   HgbA1C  Lab Results  Component Value Date   HGBA1C 7.8 (H) 02/06/2016   Urine Drug Screen     Component Value Date/Time   LABOPIA POSITIVE (A) 02/04/2016 0530   COCAINSCRNUR NONE DETECTED 02/04/2016 0530   LABBENZ NONE DETECTED 02/04/2016 0530   AMPHETMU NONE DETECTED 02/04/2016 0530   THCU POSITIVE (A) 02/04/2016 0530   LABBARB NONE DETECTED 02/04/2016 0530     SIGNIFICANT DIAGNOSTIC STUDIES  Ct Head Wo Contrast 02/11/2016 1. Resolving hydrocephalus with expected evolution of right thalamic hemorrhage and decreased mass effect on the third ventricle. 2. Diffuse periventricular and subcortical white matter hypoattenuation representing a combination of watershed territory infarcts and chronic microvascular ischemic change. These cannot be differentiated on the basis of CT. 3. No acute cortical infarct.  02/06/2016 Evolving RIGHT thalamic hemorrhage. Unchanged mild obstructive hydrocephalus.  Chronic changes include old LEFT thalamus lacunar infarct and moderate to severe chronic small vessel ischemic disease better characterized on recent MRI.   02/04/2016 Evolving 11 x 30 mm RIGHT caudal thalamic hematoma resulting in mild obstructive hydrocephalus. Severe white matter changes most compatible with chronic hypertensive encephalopathy, recommend MRI of the head on nonemergent basis for further characterization.   02/03/2016 Interval development of intraparenchymal hemorrhage seen in the right thalamus with surrounding white matter edema. Mild dilatation of lateral ventricles is noted.   Mr Brain Wo  Contrast 02/20/2016 1. No new intracranial abnormality.  2. Expected evolution of subacute hematoma in the right thalamus.   Vasogenic edema is decreased and ventriculomegaly on prior study is resolved.  3. No progression of deep cerebral watershed and right superior cerebellar peduncle infarcts.  4. Advanced for age chronic microvascular disease.  5. New and prominent sinusitis on the right. Has the patient had nasal intubation on the right?   02/06/2016 1. Bilateral acute cerebral watershed infarcts, progressed from the recent MRI.  2. Punctate acute infarct in the right superior cerebellar peduncle.  3. Unchanged right thalamic hemorrhage with surrounding edema and mild obstructive hydrocephalus.  4. Extensive chronic small vessel ischemic disease.    02/05/2016 Evolving RIGHT thalamic hematoma. Effaced  3rd ventricle with mild acute hydrocephalus.  Bilateral sub centimeter acute watershed territory infarcts. Moderate to severe white matter changes consistent with interstitial edema and chronic small vessel ischemic disease.   MRA HEAD:  02/05/2016 No acute vascular process. Mildly ectatic basilar artery.   2D echo  - Left ventricle: The cavity size was normal. Wall thickness wasincreased in a pattern of moderate LVH. Systolic function wasnormal. The estimated ejection fraction was in the range of 55%to 60%. Wall motion was normal; there were no regional wallmotion abnormalities. Doppler parameters are consistent withrestrictive physiology, indicative of decreased left ventriculardiastolic compliance and/or increased left atrial pressure. Impressions:   Extremely limited; definity used; normal LV systolic function; moderate LVH; restrictive filling.  CUS - Bilateral 1-39% ICA stenosis, antegrade vertebral flow. Difficult exam due to patient cooperation, snoring and body habitus.  LE venous doppler - No evidence of deep vein thrombosis or baker's cysts bilaterally.  TCD - no  vasospasm     HISTORY OF PRESENT ILLNESS Adam Vincent an 46 y.o.malewith known hyperlipidemia and hypertension who does not see a PCP. Patient states at that he was going to the bathroom multiple times last night when about 12:00a (LKW 02/02/2016 at 2400) he noted a sudden onset of headache. That morning he woke up and he noted that he is weaker in his left arm and his left leg. He also continued to have a headache. He had no other complaints. Patient was brought to the emergency department where CT of the brain showed intraparenchymal hemorrhage seen in the right thalamus with surrounding white matter edema. Currently blood pressure is being maintained with Cardene. Neurology was asked to consult. Patient was not administered IV t-PA secondary to [place]. He was admitted to the neuro ICU for further evaluation and treatment.   HOSPITAL COURSE Adam Vincent is a 46 y.o. male with a history of hypertension and hyperlipidemia presenting with a headache and left-sided weakness.  A CT of the head showed a right thalamic intracerebral hemorrhage in the setting of an elevated blood pressure 211/115. He was started on IV Cardene admitted to the neuro ICU.  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   Resultant  left hemiparesis and left facial droop, AMS  CT head x 2 evolving right thalamic ICH with mild obstructive hydrocephalus.   MRI 02/05/16 - evolving RIGHT thalamic hematoma. Mild acute hydrocephalus. Bilateral sub centimeter acute watershed territory infarcts.   MR repeat 02/06/16 - increase bilateral watershed territory infarcts.  MRI 02/20/16 - Expected evolution of subacute hematoma in the right thalamus.  Ventriculomegaly on prior study is resolved. No progression of deep cerebral watershed   MRA  unremarkable   Carotid Dopplers - unremarkable   2D Echo  EF 55-60%   EEG abnormal due to diffuse slowing of the waking background  LE venous doppler -  no DVT  LDL 78  HgbA1c 7.9  aspirin 81 mg daily prior to admission, now on No antithrombotic secondary to hemorrhage  Ritalin 10 mg at 0600 and 12 noon for arousal  Ongoing aggressive stroke risk factor management  Therapy recommendations:   Skilled nursing facility placement   Disposition: skilled nursing facility    Bilateral watershed territory infarcts - may be due to strict BP control and hypoxia/hypercapnia from OSA.  May explain mental status changes  May be related to strict BP control and hypoxia/hypercarbia from OSA  BP goal normotensive   TCD no sign of vasospasm  CPAP PRN at night -  pt refused   Repeat MRI 02/20/16 showed no progression of previous watershed infarcts  Hypertension  BP better controlled  SBP goal normotensive now   on po meds with labetalol 300 tid, amlodipine 10 and hydralazine 50 tid  Depression  Depressed mood  Not interested in communication with providers  Put on zoloft 50mg    obstructive sleep apnea  hypoxia/hypercapnia from OSA  Uses CPAP at home at night  CPAP here at night PRN  Hyperlipidemia  Home meds:  No statin  LDL 78  Added Lipitor 10  Continue statin on discharge  Diabetes  Glucose control much improved during hospitalizaton  HgbA1c 7.9 , goal < 7.0  Lantus to 10 units Qhs  Dysphagia   Passed swallow  On diet  Pt eats better when family present   Hold off PEG for now.  CKD, stage III  Likely due to uncontrolled HTN and DM  Other Stroke Risk Factors  Marijuana user, has 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   Morbid Obesity, Body mass index is 46.31 kg/m., recommend weight loss, diet and exercise as appropriate   Other Active Problems  Right cheek swelling -> resolved  Leukocytosis - resolved   DISCHARGE EXAM Blood pressure (!) 168/83, pulse 75, temperature 98.3 F (36.8 C), temperature source Oral, resp. rate 16, height 6\' 1"  (1.854 m), weight (!) 227.3 kg (501 lb), SpO2 99  %. General- morbid obese middle aged african [place] male, flat affect, awake alert. Ophthalmologic- Fundi not visualized due to noncooperation Cardiovascular - Regular rate and rhythm.no murmur neuro- drowsy but awakens eyes open, awake and alert. Tracking objects but not cooperative on exam, answering minimally. Dysarthric speech hesitant nonfluent.Follow commands well.Marland Kitchen PERRL, eyes moving to both sides, left facial droop. Tongue in middle position. On pain stimulation RUE and RLE purposeful movement, LLE withdrawal to pain, and LUE 0/5 moves RUE well dermatitis/ DTR 1+ and toes equivocal. Sensation, coordination and gait not cooperative.   Discharge Diet   Diet regular Room service appropriate? Yes; Fluid consistency: Thin liquids  DISCHARGE PLAN  Disposition:  Discharged to a skilled nursing facility.  No antithrombotics given hemorrhage  Ongoing risk factor control by Primary Care Physician at time of discharge  Follow-up MD at SNF for primary care  Follow-up with Dr. Antony Contras, Stroke Clinic in 6 weeks, office to schedule an appointment.  45 minutes were spent preparing discharge.  [place] for Pager information 02/26/2016 10:55 AM  I have personally examined this patient, reviewed notes, independently viewed imaging studies, participated in medical decision making and plan of care.ROS completed by me personally and pertinent positives fully documented  I have made any additions or clarifications directly to the above note. Agree with note above.    Antony Contras, MD Medical Director [facility] Pager: [phone] 02/26/2016 3:02 PM

## 2016-02-23 NOTE — Progress Notes (Signed)
Currently pt is on endo schedule for Monday 2/19 for PEG placement by Dr Hulen Skains.  Please let us know today if PEG not needed If PEG needed and primary service wishes for [place] to proceed with PEG on Monday, please make pt NPO p MN  Leighton Ruff. Redmond Pulling, MD, FACS General, Bariatric, & Minimally Invasive Surgery [facility], [place] Pager [phone]

## 2016-02-24 ENCOUNTER — Encounter (HOSPITAL_COMMUNITY): Payer: Self-pay | Admitting: Anesthesiology

## 2016-02-24 ENCOUNTER — Encounter (HOSPITAL_COMMUNITY)
Admission: EM | Disposition: A | Discharge status: Skilled Nursing Facility | Payer: Self-pay | Source: Home / Self Care | Attending: Neurology

## 2016-02-24 DIAGNOSIS — I610 Nontraumatic intracerebral hemorrhage in hemisphere, subcortical: Secondary | ICD-10-CM

## 2016-02-24 LAB — GLUCOSE, CAPILLARY
GLUCOSE-CAPILLARY: 101 mg/dL — AB (ref 65–99)
Glucose-Capillary: 131 mg/dL — ABNORMAL HIGH (ref 65–99)
Glucose-Capillary: 223 mg/dL — ABNORMAL HIGH (ref 65–99)
Glucose-Capillary: 150 mg/dL — ABNORMAL HIGH (ref 65–99)

## 2016-02-24 SURGERY — ESOPHAGOGASTRODUODENOSCOPY (EGD) WITH PROPOFOL
Anesthesia: Monitor Anesthesia Care

## 2016-02-24 NOTE — Progress Notes (Signed)
STROKE TEAM PROGRESS NOTE   SUBJECTIVE (INTERVAL HISTORY) His wife, son and 1 more family member are at bedside. He is sitting up in bedside chair. Pending discharge to SNF. Clinically stable.   OBJECTIVE Temp:  [97.8 F (36.6 C)-98.6 F (37 C)] 97.8 F (36.6 C) (02/19 0856) Pulse Rate:  [70-87] 87 (02/19 0856) Cardiac Rhythm: Heart block (02/19 0700) Resp:  [16-18] 16 (02/19 0856) BP: (137-171)/(73-94) 161/81 (02/19 0856) SpO2:  [96 %-100 %] 99 % (02/19 0856) Weight:  [352 lb (159.7 kg)] 352 lb (159.7 kg) (02/19 0500)  CBC:   Recent Labs Lab 02/21/16 0329 02/22/16 0434  WBC 9.5 9.8  HGB 12.4* 12.2*  HCT 38.8* 37.7*  MCV 85.3 84.9  PLT 312 485    Basic Metabolic Panel:   Recent Labs Lab 02/21/16 0329 02/22/16 0434  NA 135 135  K 4.0 4.2  CL 98* 101  CO2 25 25  GLUCOSE 102* 131*  BUN 24* 29*  CREATININE 1.29* 1.35*  CALCIUM 9.2 9.0    Lipid Panel:     Component Value Date/Time   CHOL 153 02/04/2016 0742   TRIG 174 (H) 02/04/2016 0742   HDL 40 (L) 02/04/2016 0742   CHOLHDL 3.8 02/04/2016 0742   VLDL 35 02/04/2016 0742   LDLCALC 78 02/04/2016 0742   HgbA1c:  Lab Results  Component Value Date   HGBA1C 7.8 (H) 02/06/2016   Urine Drug Screen:     Component Value Date/Time   LABOPIA POSITIVE (A) 02/04/2016 0530   COCAINSCRNUR NONE DETECTED 02/04/2016 0530   LABBENZ NONE DETECTED 02/04/2016 0530   AMPHETMU NONE DETECTED 02/04/2016 0530   THCU POSITIVE (A) 02/04/2016 0530   LABBARB NONE DETECTED 02/04/2016 0530      IMAGING I have personally reviewed the radiological images below and agree with the radiology interpretations.  Ct Head Wo Contrast 02/11/2016 1. Resolving hydrocephalus with expected evolution of right thalamic hemorrhage and decreased mass effect on the third ventricle. 2. Diffuse periventricular and subcortical white matter hypoattenuation representing a combination of watershed territory infarcts and chronic microvascular ischemic  change. These cannot be differentiated on the basis of CT. 3. No acute cortical infarct.  02/06/2016 Evolving RIGHT thalamic hemorrhage. Unchanged mild obstructive hydrocephalus. Chronic changes include old LEFT thalamus lacunar infarct and moderate to severe chronic small vessel ischemic disease better characterized on recent MRI.   02/04/2016 Evolving 11 x 30 mm RIGHT caudal thalamic hematoma resulting in mild obstructive hydrocephalus. Severe white matter changes most compatible with chronic hypertensive encephalopathy, recommend MRI of the head on nonemergent basis for further characterization.   02/03/2016 Interval development of intraparenchymal hemorrhage seen in the right thalamus with surrounding white matter edema. Mild dilatation of lateral ventricles is noted.   [name] 02/06/2016 IMPRESSION: 1. Bilateral acute cerebral watershed infarcts, progressed from the recent MRI. 2. Punctate acute infarct in the right superior cerebellar peduncle. 3. Unchanged right thalamic hemorrhage with surrounding edema and mild obstructive hydrocephalus. 4. Extensive chronic small vessel ischemic disease.    Mri and Mra Brain Wo Contrast 02/05/2016 IMPRESSION: MRI HEAD: Evolving RIGHT thalamic hematoma. Effaced 3rd ventricle with mild acute hydrocephalus. Bilateral sub centimeter acute watershed territory infarcts. Moderate to severe white matter changes consistent with interstitial edema and chronic small vessel ischemic disease. MRA HEAD: No acute vascular process. Mildly ectatic basilar artery.   2D echo  - Left ventricle: The cavity size was normal. Wall thickness was increased in a pattern of moderate LVH. Systolic function was normal.  The estimated ejection fraction was in the range of 55% to 60%. Wall motion was normal; there were no regional wall motion abnormalities. Doppler parameters are consistent with restrictive physiology, indicative of decreased left ventricular diastolic  compliance and/or increased left atrial pressure. Impressions:   Extremely limited; definity used; normal LV systolic function;  moderate LVH; restrictive filling.  CUS - Bilateral 1-39% ICA stenosis, antegrade vertebral flow. Difficult exam due to patient cooperation, snoring and body habitus.  LE venous doppler - No evidence of deep vein thrombosis or baker's cysts bilaterally.  TCD - no vasospasm  [name] 02/20/2016 IMPRESSION: 1. No new intracranial abnormality. 2. Expected evolution of subacute hematoma in the right thalamus. Vasogenic edema is decreased and ventriculomegaly on prior study is resolved. 3. No progression of deep cerebral watershed and right superior cerebellar peduncle infarcts. 4. Advanced for age chronic microvascular disease. 5. New and prominent sinusitis on the right. Has the patient had nasal intubation on the right?     PHYSICAL EXAM  Temp:  [97.8 F (36.6 C)-98.6 F (37 C)] 97.8 F (36.6 C) (02/19 0856) Pulse Rate:  [70-87] 87 (02/19 0856) Resp:  [16-18] 16 (02/19 0856) BP: (137-171)/(73-94) 161/81 (02/19 0856) SpO2:  [96 %-100 %] 99 % (02/19 0856) Weight:  [352 lb (159.7 kg)] 352 lb (159.7 kg) (02/19 0500)      General - morbid obese middle aged african [place] male, flat affect, awake alert.  Ophthalmologic - Fundi not visualized due to noncooperation  Cardiovascular - Regular rate and rhythm.no murmur  neuro -  eyes open, awake and alert. Tracking objects but not cooperative on exam, answering minimally. Dysarthric speech hesitant nonfluent.Follow commands well.Marland Kitchen PERRL, eyes moving to both sides, left facial droop. Tongue in middle position. On pain stimulation RUE and RLE purposeful movement, LLE withdrawal to pain, and LUE 0/5 moves RUE well dermatitis/ DTR 1+ and toes equivocal. Sensation, coordination and gait not cooperative.    ASSESSMENT/PLAN [name] is a 46 y.o. male with history of hypertension and  hyperlipidemia presenting with headache and left-sided weakness. CT showed a right thalamic intracerebral hemorrhage in setting of elevated blood pressure 211/115. He was started on IV Cardene admitted to the neuro ICU.  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   Resultant  left hemiparesis and left facial droop, AMS  CT head x 2 evolving right thalamic ICH with mild obstructive hydrocephalus.   MRI 02/05/16 - evolving RIGHT thalamic hematoma. Mild acute hydrocephalus. Bilateral sub centimeter acute watershed territory infarcts.   MR repeat 02/06/16 - increase bilateral watershed territory infarcts.  MRI 02/20/16 - Expected evolution of subacute hematoma in the right thalamus.  Ventriculomegaly on prior study is resolved. No progression of deep cerebral watershed   MRA  unremarkable   Carotid Doppler  unremarkable   2D Echo  EF 55-60%   EEG abnormal due to diffuse slowing of the waking background  LE venous doppler - no DVT  LDL 78  HgbA1c 7.9  Heparin subcutaneous for VTE prophylaxis Diet regular Room service appropriate? Yes; Fluid consistency: Thin  aspirin 81 mg daily prior to admission, now on No antithrombotic secondary to hemorrhage  Ritalin to 10 mg at 0600 and 12 noon for arousal  Ongoing aggressive stroke risk factor management  Therapy recommendations:  SNF   Disposition:  pending  Bilateral watershed territory infarcts - may be due to strict BP control and hypoxia/hypercapnia from OSA.  May explain mental status changes  May  be related to strict BP control and hypoxia/hypercarbia from OSA  BP goal normotensive   TCD no sign of vasospasm  CPAP PRN at night - pt refused   Repeat MRI 02/20/16 showed no progression of previous watershed infarcts  Hypertension  BP better controlled  SBP goal normotensive now   on po meds with labetalol 300 tid, amlodipine 10 and hydralazine 50 tid  Depression  Depressed mood  Not  interested in communication with providers  Put on zoloft 50mg    obstructive sleep apnea  hypoxia/hypercapnia from OSA  Uses CPAP at home at night  CPAP here at night PRN  Hyperlipidemia  Home meds:  No statin  LDL 78  Added Lipitor 10  Continue statin on discharge  Diabetes  Hyperglycemia still not in good control,   Check CBGs  SSI  HgbA1c 7.9 , goal < 7.0  Lantus to 10 units Qhs  Dysphagia   Passed swallow  On diet  IVF @ 75  Pt was able to eat with family but not when family absent  Hold off PEG for now.  CKD, stage III  Creatinine 2.14->2.22-> 1.93->1.87 -> 1.52 -> 1.52 ->1.56->1.52->1.28->1.29  Likely due to uncontrolled HTN and DM  On IV fluid  Other Stroke Risk Factors  Marijuana user, has 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   Morbid Obesity, Body mass index is 46.44 kg/m., recommend weight loss, diet and exercise as appropriate   Other Active Problems  Right cheek swelling -> resolved  Leukocytosis - 12.5 -> 10.2 -> 10.6 -> 9.5 -> 10.0 -> 9.4 -> 10.3-> 9.5 (afebrile)  Hospital day # 21   Personally examined patient and images, and have participated in and made any corrections needed to history, physical, neuro exam,assessment and plan as stated above.  I have personally obtained the history, evaluated lab date, reviewed imaging studies and agree with radiology interpretations. Patient waiting for SNF.D/w patient and family. Medically stable for Dc to SNF when bed available.   Antony Contras, MD Stroke Neurology [facility]   To contact Stroke Continuity provider, please refer to [email or web address] After hours, contact General Neurology

## 2016-02-24 NOTE — Progress Notes (Signed)
Physical Therapy Treatment Patient Details Name: Adam Vincent MRN: [id number] DOB: 10/07/1970 Today's Date: 02/24/2016    History of Present Illness Pt is a 46 y.o. male who presented to the ED with L side weakness. CT revealign intraparenchymal hemorrhage in R thalamus and surrounding white matter edema. Also with B sub centimeter acute watershed territory infarcts. CT on 2/6 revealing resolving hydrocephalus with expected evolution of R thalamic hemorrhage and decreased mass effect on the third ventricle. PMH: diabetes mellitus, hypertension.    PT Comments    Pt remains to require total assist +2-+3.  Pt more alert but remains unable to actively participate.  Focused today's treatment on stretching B LEs to maintain ROM and positioning to improve respiratory function.  Will continue to provide acute services 3x/week during acute hopsitalization.      Follow Up Recommendations  SNF;Supervision/Assistance - 24 hour     Equipment Recommendations  Other (comment)    Recommendations for Other Services Rehab consult     Precautions / Restrictions Precautions Precautions: Fall Precaution Comments: left hemiplegia Restrictions Weight Bearing Restrictions: No    Mobility  Bed Mobility   Bed Mobility: Rolling Rolling: Total assist;+2 for physical assistance (rolling to right and left to change soiled pad and to place maximove sling under patient in prep for transfer OOB.  )         General bed mobility comments: Pt remains unable to provide active assist during bed mobility.  transferred patient to chair to improve body position and improve respiratory function in an upright seated position.  Positioned B LE into neutral alignement, other wise Pt rest in B ER of hips.    Transfers Overall transfer level: Needs assistance Equipment used:  (maximove.  Pt remains unsafe to transfer without lift equipment at this time.  ) Transfers:  (total assist with maximove.  )            General transfer comment: Pt unable to engage in mobility. Utilized maximove for pt safety to transfer to chair.  Ambulation/Gait                 Stairs            Wheelchair Mobility    Modified Rankin (Stroke Patients Only)       Balance Overall balance assessment: Needs assistance   Sitting balance-Leahy Scale: Zero (required positioning and propping to maintain seated posture in recliner chair.  )                              Cognition Arousal/Alertness: Lethargic Behavior During Therapy: Flat affect Overall Cognitive Status: Difficult to assess Area of Impairment: Attention;Following commands;Awareness;Problem solving Orientation Level: Disoriented to;Time Current Attention Level: Selective Memory: Decreased short-term memory Following Commands: Follows one step commands inconsistently;Follows one step commands with increased time Safety/Judgement: Decreased awareness of safety;Decreased awareness of deficits Awareness: Intellectual Problem Solving: Slow processing;Decreased initiation;Difficulty sequencing;Requires verbal cues;Requires tactile cues General Comments: Pt intially with eyes opne on arrival as treatment progressed, patient began to close his eyes.      Exercises Other Exercises Other Exercises: Pt LE heel cord and hamstring stretching 3x30 sec each (B).  Pt then received stretching to B quads 3x30 sec.  Pt unable to follow commands to participate in active ROM.      General Comments        Pertinent Vitals/Pain Faces Pain Scale: Hurts little more (with activity and mobility.  )  Pain Location: Pt remains to withdraw hand and foot away from painful stimuli with increased time.  Pt with intermittent episodes of eyes open.   Pain Intervention(s): Repositioned    Home Living                      Prior Function            PT Goals (current goals can now be found in the care plan section) Acute Rehab PT  Goals Patient Stated Goal: none stated Potential to Achieve Goals: Fair Progress towards PT goals: Progressing toward goals    Frequency    Min 3X/week      PT Plan Discharge plan needs to be updated    Co-evaluation             End of Session Equipment Utilized During Treatment:  (maximove sling) Activity Tolerance: Patient limited by lethargy;Patient limited by fatigue Patient left: in chair;with call bell/phone within reach;with family/visitor present;with chair alarm set (maximove sling under patient in chair.  ) Nurse Communication: Mobility status;Need for lift equipment       Time: 1019-1055 PT Time Calculation (min) (ACUTE ONLY): 36 min  Charges:  $Therapeutic Exercise: 8-22 mins $Therapeutic Activity: 8-22 mins                    G Codes:       Cristela Blue 03-13-16, 11:10 AM Governor Rooks, PTA pager [phone]

## 2016-02-24 NOTE — Progress Notes (Signed)
Patient ID: Adam Vincent, male   DOB: 30-Jan-1970, 46 y.o.   MRN: [id number] PEG cancelled. Trauma signing off. Georganna Skeans, MD, MPH, FACS Trauma: [phone] General Surgery: [phone]

## 2016-02-24 NOTE — Progress Notes (Signed)
LCSW continues to follow for disposition. Discussed plans with wife via phone who remains in agreement with SNF placement. Wife made aware that placement may not be local and though she is unhappy she understands.  LCSW also called Financial Counselors to understand if Medicaid application/Disability has been started.  Message left. Wife reports no one has called regarding Medicaid, however their daughter works for social services and did grab an application, but per wife, someone reported they would take care of medicaid and disability, it is just unclear who she was talking too.  LCSW discussed case with medical director: Reynaldo Minium for hard to place bed. Referrals sent to [facility], [facility], [facility], and [facility]. [facility] also was referred.  Will follow up with the follow facilities.  Currently there are no options in the local area. Will continue to follow and assist with disposition.    Lane Hacker, MSW Clinical Social Work: [employer] Coverage for :

## 2016-02-25 LAB — GLUCOSE, CAPILLARY
GLUCOSE-CAPILLARY: 113 mg/dL — AB (ref 65–99)
GLUCOSE-CAPILLARY: 166 mg/dL — AB (ref 65–99)
GLUCOSE-CAPILLARY: 103 mg/dL — AB (ref 65–99)
Glucose-Capillary: 131 mg/dL — ABNORMAL HIGH (ref 65–99)

## 2016-02-25 NOTE — Progress Notes (Signed)
  Speech Language Pathology Treatment: Cognitive-Linquistic  Patient Details Name: Adam Vincent MRN: [id number] DOB: October 01, 1970 Today's Date: 02/25/2016 Time: 3437-3578 SLP Time Calculation (min) (ACUTE ONLY): 15 min  Assessment / Plan / Recommendation Clinical Impression  Session focused on facilitating communication.  Pt with notable effort; able to imitate vocalizations/humming but no articulated speech could be elicited despite max verbal/tactile cues. Responded to yes/no questions intermittently with head nod/shake.  Followed commands/ identified objects through eye gaze with 40% reliability, but became less interactive as session progressed.  Plan is for likely D/C to SNF tomorrow.  Will follow pending D/C.   HPI HPI: This 46 y.o. male admitted with generalized weakness and cough. Head CT-Evolving RIGHT thalamic hemorrhage. MRI-Evolving RIGHT thalamic hematoma. Effaced 3rd ventricle with mild acute hydrocephalus. Bilateral sub centimeter acute watershed territory infarcts. PMH includes:  CAD with multiple stents, COPD on 2L 02, HTN, CKDIII, parosysmal A-fib, h/o CVA, syncope, PNA, h/o substance abuse, conversion, anxiety disorder, cognitive impairment.       SLP Plan  Continue with current plan of care       Recommendations                   Follow up Recommendations: Inpatient Rehab SLP Visit Diagnosis: Cognitive communication deficit (X78.478) Plan: Continue with current plan of care       GO                Juan Quam Laurice 02/25/2016, 4:06 PM

## 2016-02-25 NOTE — Progress Notes (Signed)
Patient would not take meds scheduled for this time.  Patient would either not open mouth or when finally would open mouth he would then spit the med out.

## 2016-02-25 NOTE — Progress Notes (Signed)
Occupational Therapy Treatment Patient Details Name: Adam Vincent MRN: [id number] DOB: Jan 15, 1970 Today's Date: 02/25/2016    History of present illness Pt is a 46 y.o. male who presented to the ED with L side weakness. CT revealign intraparenchymal hemorrhage in R thalamus and surrounding white matter edema. Also with B sub centimeter acute watershed territory infarcts. CT on 2/6 revealing resolving hydrocephalus with expected evolution of R thalamic hemorrhage and decreased mass effect on the third ventricle. PMH: diabetes mellitus, hypertension.   OT comments  Pt making some progress toward OT goals. Able to assist with grooming tasks at bed level with maximum assistance. Pt remains highly lethargic and only opened his eyes briefly x3 during session. Due to level of participation, updating D/C recommendations to SNF placement for continued OT services in order to maximize ability to participate in ADL.   Follow Up Recommendations  SNF;Supervision/Assistance - 24 hour    Equipment Recommendations  Other (comment) (TBD at next venue of care)    Recommendations for Other Services      Precautions / Restrictions Precautions Precautions: Fall Precaution Comments: left hemiplegia       Mobility Bed Mobility               General bed mobility comments: Pt unable to assist this session. Due to level of lethargy, unsafe to attempt bed mobility at this time.  Transfers                 General transfer comment: Pt unable to engage in mobility. Increased lethargy this session and pt unable to maintain alertness enough to attempt.    Balance                                   ADL Overall ADL's : Needs assistance/impaired     Grooming: Oral care;Maximal assistance;Bed level;Wash/dry face Grooming Details (indicate cue type and reason): Pt able to assist with oral care and face washing tasks this session. Able to raise shoulder to attempt to bring washcloth  to face. Pt remaining with eyes closed during session. Max hand over hand assistance to grasp toothbrush and initiate task but attempted gross grasp on utensil.                               General ADL Comments: Pt opened his eyes 3x during session but otherwise remained lethargic. However, with tactile cues and maximum encouragement pt did attempt to participate in grooming tasks with max assist.      Vision                     Perception     Praxis      Cognition   Behavior During Therapy: Flat affect Overall Cognitive Status: Difficult to assess Area of Impairment: Attention;Following commands   Current Attention Level: Selective    Following Commands: Follows one step commands inconsistently              Exercises Other Exercises Other Exercises: PROM B UE's in all  planes. No subluxation noted. B LE AAROM to reposition out of extreme ER.   Shoulder Instructions       General Comments      Pertinent Vitals/ Pain       Pain Assessment: Faces Faces Pain Scale: Hurts little more Pain Location: Limited alertness during session. Withdraws  hand and foot from painful stimuli.  Pain Intervention(s): Limited activity within patient's tolerance;Monitored during session  Home Living                                          Prior Functioning/Environment              Frequency  Min 3X/week        Progress Toward Goals  OT Goals(current goals can now be found in the care plan section)  Progress towards OT goals: Progressing toward goals  Acute Rehab OT Goals Patient Stated Goal: none stated OT Goal Formulation: With family Time For Goal Achievement: 02/27/16 Potential to Achieve Goals: Fair ADL Goals Pt Will Perform Grooming: with max assist;sitting Additional ADL Goal #1: Pt will maintain sustained attention x 30 seconds for ADL tasks  Additional ADL Goal #2: Pt will sit EOB with mod A x 5 mins in prep for ADLs   Additional ADL Goal #3: Pt will maintain adequate eye opening to complete visual assessment   Plan Discharge plan remains appropriate    Co-evaluation                 End of Session    OT Visit Diagnosis: Muscle weakness (generalized) (M62.81)   Activity Tolerance Patient tolerated treatment well   Patient Left in bed;with call bell/phone within reach;with SCD's reapplied   Nurse Communication Mobility status        Time: 0370-4888 OT Time Calculation (min): 19 min  Charges: OT General Charges $OT Visit: 1 Procedure OT Treatments $Self Care/Home Management : 8-22 mins  Norman Herrlich, MS OTR/L  Pager: [place] A Micha Erck 02/25/2016, 3:47 PM

## 2016-02-25 NOTE — Progress Notes (Signed)
Pt. placed on CPAP for h/s, room air, tolerating well, RN aware was at bedside.

## 2016-02-25 NOTE — Progress Notes (Signed)
STROKE TEAM PROGRESS NOTE   SUBJECTIVE (INTERVAL HISTORY) His family members are not at bedside. He is medically stable for discharge to [facility] skilled nursing facility tomorrow.  OBJECTIVE Temp:  [97.9 F (36.6 C)-98.5 F (36.9 C)] 97.9 F (36.6 C) (02/20 1432) Pulse Rate:  [70-84] 70 (02/20 1432) Cardiac Rhythm: Normal sinus rhythm (02/20 0700) Resp:  [18-20] 20 (02/20 1432) BP: (118-180)/(70-100) 180/78 (02/20 1432) SpO2:  [99 %-100 %] 100 % (02/20 1432)  CBC:   Recent Labs Lab 02/21/16 0329 02/22/16 0434  WBC 9.5 9.8  HGB 12.4* 12.2*  HCT 38.8* 37.7*  MCV 85.3 84.9  PLT 312 416    Basic Metabolic Panel:   Recent Labs Lab 02/21/16 0329 02/22/16 0434  NA 135 135  K 4.0 4.2  CL 98* 101  CO2 25 25  GLUCOSE 102* 131*  BUN 24* 29*  CREATININE 1.29* 1.35*  CALCIUM 9.2 9.0    Lipid Panel:     Component Value Date/Time   CHOL 153 02/04/2016 0742   TRIG 174 (H) 02/04/2016 0742   HDL 40 (L) 02/04/2016 0742   CHOLHDL 3.8 02/04/2016 0742   VLDL 35 02/04/2016 0742   LDLCALC 78 02/04/2016 0742   HgbA1c:  Lab Results  Component Value Date   HGBA1C 7.8 (H) 02/06/2016   Urine Drug Screen:     Component Value Date/Time   LABOPIA POSITIVE (A) 02/04/2016 0530   COCAINSCRNUR NONE DETECTED 02/04/2016 0530   LABBENZ NONE DETECTED 02/04/2016 0530   AMPHETMU NONE DETECTED 02/04/2016 0530   THCU POSITIVE (A) 02/04/2016 0530   LABBARB NONE DETECTED 02/04/2016 0530      IMAGING I have personally reviewed the radiological images below and agree with the radiology interpretations.  Ct Head Wo Contrast 02/11/2016 1. Resolving hydrocephalus with expected evolution of right thalamic hemorrhage and decreased mass effect on the third ventricle. 2. Diffuse periventricular and subcortical white matter hypoattenuation representing a combination of watershed territory infarcts and chronic microvascular ischemic change. These cannot be differentiated on the basis of  CT. 3. No acute cortical infarct.  02/06/2016 Evolving RIGHT thalamic hemorrhage. Unchanged mild obstructive hydrocephalus. Chronic changes include old LEFT thalamus lacunar infarct and moderate to severe chronic small vessel ischemic disease better characterized on recent MRI.   02/04/2016 Evolving 11 x 30 mm RIGHT caudal thalamic hematoma resulting in mild obstructive hydrocephalus. Severe white matter changes most compatible with chronic hypertensive encephalopathy, recommend MRI of the head on nonemergent basis for further characterization.   02/03/2016 Interval development of intraparenchymal hemorrhage seen in the right thalamus with surrounding white matter edema. Mild dilatation of lateral ventricles is noted.   [name] 02/06/2016 IMPRESSION: 1. Bilateral acute cerebral watershed infarcts, progressed from the recent MRI. 2. Punctate acute infarct in the right superior cerebellar peduncle. 3. Unchanged right thalamic hemorrhage with surrounding edema and mild obstructive hydrocephalus. 4. Extensive chronic small vessel ischemic disease.    Mri and Mra Brain Wo Contrast 02/05/2016 IMPRESSION: MRI HEAD: Evolving RIGHT thalamic hematoma. Effaced 3rd ventricle with mild acute hydrocephalus. Bilateral sub centimeter acute watershed territory infarcts. Moderate to severe white matter changes consistent with interstitial edema and chronic small vessel ischemic disease. MRA HEAD: No acute vascular process. Mildly ectatic basilar artery.   2D echo  - Left ventricle: The cavity size was normal. Wall thickness was increased in a pattern of moderate LVH. Systolic function was normal. The estimated ejection fraction was in the range of 55% to 60%. Wall motion was  normal; there were no regional wall motion abnormalities. Doppler parameters are consistent with restrictive physiology, indicative of decreased left ventricular diastolic compliance and/or increased left atrial pressure. Impressions:    Extremely limited; definity used; normal LV systolic function;  moderate LVH; restrictive filling.  CUS - Bilateral 1-39% ICA stenosis, antegrade vertebral flow. Difficult exam due to patient cooperation, snoring and body habitus.  LE venous doppler - No evidence of deep vein thrombosis or baker's cysts bilaterally.  TCD - no vasospasm  [name] 02/20/2016 IMPRESSION: 1. No new intracranial abnormality. 2. Expected evolution of subacute hematoma in the right thalamus. Vasogenic edema is decreased and ventriculomegaly on prior study is resolved. 3. No progression of deep cerebral watershed and right superior cerebellar peduncle infarcts. 4. Advanced for age chronic microvascular disease. 5. New and prominent sinusitis on the right. Has the patient had nasal intubation on the right?     PHYSICAL EXAM  Temp:  [97.9 F (36.6 C)-98.5 F (36.9 C)] 97.9 F (36.6 C) (02/20 1432) Pulse Rate:  [70-84] 70 (02/20 1432) Resp:  [18-20] 20 (02/20 1432) BP: (118-180)/(70-100) 180/78 (02/20 1432) SpO2:  [99 %-100 %] 100 % (02/20 1432)      General - morbid obese middle aged african [place] male, flat affect, awake alert.  Ophthalmologic - Fundi not visualized due to noncooperation  Cardiovascular - Regular rate and rhythm.no murmur  neuro -  drowsy but awakens eyes open, awake and alert. Tracking objects but not cooperative on exam, answering minimally. Dysarthric speech hesitant nonfluent.Follow commands well.Marland Kitchen PERRL, eyes moving to both sides, left facial droop. Tongue in middle position. On pain stimulation RUE and RLE purposeful movement, LLE withdrawal to pain, and LUE 0/5 moves RUE well dermatitis/ DTR 1+ and toes equivocal. Sensation, coordination and gait not cooperative.    ASSESSMENT/PLAN Adam Vincent is a 46 y.o. male with history of hypertension and hyperlipidemia presenting with headache and left-sided weakness. CT showed a right thalamic intracerebral  hemorrhage in setting of elevated blood pressure 211/115. He was started on IV Cardene admitted to the neuro ICU.  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   Resultant  left hemiparesis and left facial droop, AMS  CT head x 2 evolving right thalamic ICH with mild obstructive hydrocephalus.   MRI 02/05/16 - evolving RIGHT thalamic hematoma. Mild acute hydrocephalus. Bilateral sub centimeter acute watershed territory infarcts.   MR repeat 02/06/16 - increase bilateral watershed territory infarcts.  MRI 02/20/16 - Expected evolution of subacute hematoma in the right thalamus.  Ventriculomegaly on prior study is resolved. No progression of deep cerebral watershed   MRA  unremarkable   Carotid Doppler  unremarkable   2D Echo  EF 55-60%   EEG abnormal due to diffuse slowing of the waking background  LE venous doppler - no DVT  LDL 78  HgbA1c 7.9  Heparin subcutaneous for VTE prophylaxis Diet regular Room service appropriate? Yes; Fluid consistency: Thin  aspirin 81 mg daily prior to admission, now on No antithrombotic secondary to hemorrhage  Ritalin to 10 mg at 0600 and 12 noon for arousal  Ongoing aggressive stroke risk factor management  Therapy recommendations:  SNF   Disposition:  pending  Bilateral watershed territory infarcts - may be due to strict BP control and hypoxia/hypercapnia from OSA.  May explain mental status changes  May be related to strict BP control and hypoxia/hypercarbia from OSA  BP goal normotensive   TCD no sign of vasospasm  CPAP PRN  at night - pt refused   Repeat MRI 02/20/16 showed no progression of previous watershed infarcts  Hypertension  BP better controlled  SBP goal normotensive now   on po meds with labetalol 300 tid, amlodipine 10 and hydralazine 50 tid  Depression  Depressed mood  Not interested in communication with providers  Put on zoloft 50mg    obstructive sleep  apnea  hypoxia/hypercapnia from OSA  Uses CPAP at home at night  CPAP here at night PRN  Hyperlipidemia  Home meds:  No statin  LDL 78  Added Lipitor 10  Continue statin on discharge  Diabetes  Hyperglycemia still not in good control,   Check CBGs  SSI  HgbA1c 7.9 , goal < 7.0  Lantus to 10 units Qhs  Dysphagia   Passed swallow  On diet  IVF @ 75  Pt was able to eat with family but not when family absent  Hold off PEG for now.  CKD, stage III  Creatinine 2.14->2.22-> 1.93->1.87 -> 1.52 -> 1.52 ->1.56->1.52->1.28->1.29  Likely due to uncontrolled HTN and DM  On IV fluid  Other Stroke Risk Factors  Marijuana user, has 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   Morbid Obesity, Body mass index is 46.44 kg/m., recommend weight loss, diet and exercise as appropriate   Other Active Problems  Right cheek swelling -> resolved  Leukocytosis - 12.5 -> 10.2 -> 10.6 -> 9.5 -> 10.0 -> 9.4 -> 10.3-> 9.5 (afebrile)  Hospital day # 22   Personally examined patient and images, and have participated in and made any corrections needed to history, physical, neuro exam,assessment and plan as stated above.  I have personally obtained the history, evaluated lab date, reviewed imaging studies and agree with radiology interpretations. Patient waiting for SNF.  Medically stable for Dc to SNF when bed available.   Antony Contras, MD Stroke Neurology [facility]   To contact Stroke Continuity provider, please refer to [email or web address] After hours, contact General Neurology

## 2016-02-25 NOTE — Progress Notes (Signed)
Patient is able to discharge to [name] on Wednesday. CSW updated wife, who asked CSW to discuss it with patient's daughter.    Adam Vincent Iten LCSWA [phone]

## 2016-02-25 NOTE — Care Management Note (Signed)
Case Management Note  Patient Details  Name: Adam Vincent MRN: [id number] Date of Birth: 1970-10-09  Subjective/Objective:                    Action/Plan: Plan is for SNF tomorrow. CM following for further d/c needs.   Expected Discharge Date:                  Expected Discharge Plan:  Skilled Nursing Facility  In-House Referral:  Clinical Social Work  Discharge planning Services  CM Consult  Post Acute Care Choice:    Choice offered to:     DME Arranged:    DME Agency:     HH Arranged:    [place] Agency:     Status of Service:  Completed, signed off  If discussed at [employer], dates discussed:    Additional Comments:  Pollie Friar, RN 02/25/2016, 3:55 PM

## 2016-02-26 DIAGNOSIS — F32A Depression, unspecified: Secondary | ICD-10-CM | POA: Diagnosis present

## 2016-02-26 DIAGNOSIS — G4733 Obstructive sleep apnea (adult) (pediatric): Secondary | ICD-10-CM | POA: Diagnosis present

## 2016-02-26 DIAGNOSIS — I619 Nontraumatic intracerebral hemorrhage, unspecified: Secondary | ICD-10-CM | POA: Diagnosis present

## 2016-02-26 DIAGNOSIS — E785 Hyperlipidemia, unspecified: Secondary | ICD-10-CM | POA: Diagnosis present

## 2016-02-26 DIAGNOSIS — F329 Major depressive disorder, single episode, unspecified: Secondary | ICD-10-CM | POA: Diagnosis present

## 2016-02-26 LAB — GLUCOSE, CAPILLARY
GLUCOSE-CAPILLARY: 104 mg/dL — AB (ref 65–99)
Glucose-Capillary: 108 mg/dL — ABNORMAL HIGH (ref 65–99)
Glucose-Capillary: 209 mg/dL — ABNORMAL HIGH (ref 65–99)

## 2016-02-26 MED ORDER — SENNOSIDES-DOCUSATE SODIUM 8.6-50 MG PO TABS: 1.0000 | ORAL_TABLET | Freq: Two times a day (BID) | ORAL | Status: DC

## 2016-02-26 MED ORDER — ATORVASTATIN CALCIUM 10 MG PO TABS: 10.0000 mg | ORAL_TABLET | Freq: Every day | ORAL | Status: AC

## 2016-02-26 MED ORDER — ADULT MULTIVITAMIN W/MINERALS CH: 1.0000 | ORAL_TABLET | Freq: Every day | ORAL | Status: DC

## 2016-02-26 MED ORDER — HYDRALAZINE HCL 50 MG PO TABS: 50.0000 mg | ORAL_TABLET | Freq: Three times a day (TID) | ORAL | Status: DC

## 2016-02-26 MED ORDER — INSULIN GLARGINE 100 UNIT/ML ~~LOC~~ SOLN: 10.0000 [IU] | Freq: Every day | SUBCUTANEOUS | 11 refills | Status: DC

## 2016-02-26 MED ORDER — ATORVASTATIN CALCIUM 10 MG PO TABS: 10.0000 mg | ORAL_TABLET | Freq: Every day | ORAL | Status: DC

## 2016-02-26 MED ORDER — METFORMIN HCL 500 MG PO TABS: 500.0000 mg | ORAL_TABLET | Freq: Every day | ORAL | Status: DC

## 2016-02-26 MED ORDER — PREMIER PROTEIN SHAKE: 11.0000 [oz_av] | Freq: Two times a day (BID) | ORAL | 0 refills | Status: DC

## 2016-02-26 MED ORDER — LABETALOL HCL 300 MG PO TABS: 300.0000 mg | ORAL_TABLET | Freq: Three times a day (TID) | ORAL | Status: DC

## 2016-02-26 MED ORDER — AMLODIPINE BESYLATE 10 MG PO TABS: 10.0000 mg | ORAL_TABLET | Freq: Every day | ORAL | Status: AC

## 2016-02-26 MED ORDER — SERTRALINE HCL 50 MG PO TABS: 50.0000 mg | ORAL_TABLET | Freq: Every day | ORAL | Status: AC

## 2016-02-26 MED ORDER — METHYLPHENIDATE HCL 10 MG PO TABS: 10.0000 mg | ORAL_TABLET | Freq: Two times a day (BID) | ORAL | 0 refills | Status: DC

## 2016-02-26 MED ORDER — PANTOPRAZOLE SODIUM 40 MG PO PACK: 40.0000 mg | PACK | Freq: Every day | ORAL | Status: DC

## 2016-02-26 NOTE — Clinical Social Work Placement (Signed)
   CLINICAL SOCIAL WORK PLACEMENT  NOTE  Date:  02/26/2016  Patient Details  Name: Adam Vincent MRN: [id number] Date of Birth: 1970/09/05  Clinical Social Work is seeking post-discharge placement for this patient at the [place] level of care (*CSW will initial, date and re-position this form in  chart as items are completed):  Yes   Patient/family provided with [place] Work Department's list of facilities offering this level of care within the geographic area requested by the patient (or if unable, by the patient's family).  Yes   Patient/family informed of their freedom to choose among providers that offer the needed level of care, that participate in Medicare, Medicaid or managed care program needed by the patient, have an available bed and are willing to accept the patient.  Yes   Patient/family informed of [facility]'s ownership interest in [facility] and [facility], as well as of the fact that they are under no obligation to receive care at these facilities.  PASRR submitted to EDS on 02/18/16     PASRR number received on 02/18/16     Existing PASRR number confirmed on       FL2 transmitted to all facilities in geographic area requested by pt/family on 02/18/16     FL2 transmitted to all facilities within larger geographic area on       Patient informed that his/her managed care company has contracts with or will negotiate with certain facilities, including the following:        Yes   Patient/family informed of bed offers received.  Patient chooses bed at [facility]     Physician recommends and patient chooses bed at      Patient to be transferred to [facility] on 02/26/16.  Patient to be transferred to facility by [facility]     Patient family notified on 02/26/16 of transfer.  Name of family member notified:  Tiffany     PHYSICIAN Please sign FL2, Please prepare priority discharge summary, including  medications     Additional Comment:    _______________________________________________ Benard Halsted, LCSWA 02/26/2016, 8:12 AM

## 2016-02-26 NOTE — Progress Notes (Signed)
Report given to nurse Alycia Rossetti at [facility]. All belongings sent with daughter. Dght aware of disposition. Transport per [facility].  Ave Filter, RN

## 2016-02-26 NOTE — Progress Notes (Signed)
Patient will DC to: [facility]  Anticipated DC date: 02/26/16 Family notified: Daughter, Automotive engineer by: Corey Harold   Per MD patient ready for DC to [facility]. RN, patient, patient's family, and facility notified of DC. Discharge Summary sent to facility. RN given number for report. DC packet on chart. Ambulance transport requested for patient.   CSW signing off.  Cedric Fishman, [place] Social Worker [phone]

## 2016-02-26 NOTE — Progress Notes (Addendum)
Physical Therapy Treatment Patient Details Name: Adam Vincent MRN: [id number] DOB: 1970-06-11 Today's Date: 02/26/2016    History of Present Illness Pt is a 46 y.o. male who presented to the ED with L side weakness. CT revealign intraparenchymal hemorrhage in R thalamus and surrounding white matter edema. Also with B sub centimeter acute watershed territory infarcts. CT on 2/6 revealing resolving hydrocephalus with expected evolution of R thalamic hemorrhage and decreased mass effect on the third ventricle. PMH: diabetes mellitus, hypertension.    PT Comments    Pt able to move LEs including knees and ankles but unable to follow commands to move legs edge of bed.  Pt required +3 total assist to sit edge of bed x 15 min during med intake and static sitting balance.  Pt will continue to benefit from placement at SNF to receive rehab and improve function.      Follow Up Recommendations  SNF;Supervision/Assistance - 24 hour     Equipment Recommendations  Other (comment)    Recommendations for Other Services       Precautions / Restrictions Precautions Precautions: Fall Precaution Comments: left hemiplegia Restrictions Weight Bearing Restrictions: No    Mobility  Bed Mobility Overal bed mobility: Needs Assistance       Supine to sit: Total assist;+2 for physical assistance (+3.  Pt performed with +3 assist to move legs to edge of bed, slide hips forward and elevate trunk into sitting.  ) Sit to supine: Total assist;+2 for physical assistance   General bed mobility comments: Pt required assist with B LEs (+2) and +1 to lay trunk down to bed for a total of +3 assist.     Transfers Overall transfer level:  (did not perform.  )                  Ambulation/Gait                 Stairs            Wheelchair Mobility    Modified Rankin (Stroke Patients Only) Modified Rankin (Stroke Patients Only) Pre-Morbid Rankin Score: No symptoms Modified Rankin:  Severe disability     Balance Overall balance assessment: Needs assistance Sitting-balance support: Feet supported;Single extremity supported Sitting balance-Leahy Scale: Zero Sitting balance - Comments: Pt with therapist providing posterior support during EOB activity. Pt requiring max to total assist for trunk support and demonstrated no attempt to recover when support was suddenly removed.  Postural control: Posterior lean;Left lateral lean     Standing balance comment: unable to assess.                      Cognition Arousal/Alertness: Lethargic Behavior During Therapy: Flat affect Overall Cognitive Status: Difficult to assess Area of Impairment: Attention;Following commands Orientation Level: Time;Disoriented to Current Attention Level: Selective Memory: Decreased short-term memory Following Commands: Follows one step commands inconsistently Safety/Judgement: Decreased awareness of safety;Decreased awareness of deficits Awareness: Intellectual Problem Solving: Slow processing;Decreased initiation;Difficulty sequencing;Requires verbal cues;Requires tactile cues General Comments: Pt able to keep eyes open during entire duration of treatment.      Exercises      General Comments        Pertinent Vitals/Pain Pain Assessment: No/denies pain Faces Pain Scale: No hurt Pain Location: Pt does not appear to be in pain during session.   Pain Descriptors / Indicators: Headache Pain Intervention(s): Limited activity within patient's tolerance;Monitored during session    Home Living  Prior Function            PT Goals (current goals can now be found in the care plan section) Acute Rehab PT Goals Patient Stated Goal: none stated Potential to Achieve Goals: Fair Progress towards PT goals: Progressing toward goals    Frequency    Min 3X/week      PT Plan Current plan remains appropriate    Co-evaluation             End of  Session Equipment Utilized During Treatment: Gait belt Activity Tolerance: Patient limited by lethargy;Patient limited by fatigue Patient left: in bed;with call bell/phone within reach;with nursing/sitter in room (feeding patient) Nurse Communication: Mobility status;Need for lift equipment       Time: [phone] PT Time Calculation (min) (ACUTE ONLY): 23 min  Charges:  $Therapeutic Exercise: 23-37 mins                    G Codes:       Cristela Blue 2016-03-07, 10:36 AM Governor Rooks, PTA pager [phone]

## 2016-02-26 NOTE — Care Management Note (Signed)
Case Management Note  Patient Details  Name: FILIPE GREATHOUSE MRN: [id number] Date of Birth: March 18, 1970  Subjective/Objective:                    Action/Plan: Pt discharging to [facility] today. No further needs per CM.   Expected Discharge Date:  02/26/16               Expected Discharge Plan:  Skilled Nursing Facility  In-House Referral:  Clinical Social Work  Discharge planning Services  CM Consult  Post Acute Care Choice:    Choice offered to:     DME Arranged:    DME Agency:     HH Arranged:    [place] Agency:     Status of Service:  Completed, signed off  If discussed at [employer], dates discussed:    Additional Comments:  Pollie Friar, RN 02/26/2016, 11:12 AM

## 2016-02-27 ENCOUNTER — Telehealth: Payer: Self-pay

## 2016-02-27 NOTE — Telephone Encounter (Signed)
LEft vm for TIffany to call back concerning form. Pt is currently in nursing home. PT needs to sign release form. IF patients daughter has HCPOA than its acceptable if not pt needs to sign form. LEft number for TIffany to call back.

## 2016-03-04 NOTE — Telephone Encounter (Signed)
Rn spoke with patients daughter about the attending physicians form. Rn stated per notes no appt was made because he is in [place] in rehab facility. Tiffany did not know of a release date for her father. Rn ask pts daughter can the rehab facility do a letter or fill the form out for pt. Tiffany stated the form she drop off to use was already filled out by Dr.Sethi when her father was in the hospital. Jonelle Sidle stated the form is not needed now. Rn verbalized understanding that Dr.Sethi already fill the form out before her father got discharge from the hospital.

## 2016-06-29 MED FILL — SERTRALINE HCL 50 MG TABLET: 50 | 7 days supply | Qty: 7 | Fill #0

## 2016-07-02 MED FILL — TRIAMTERENE-HCTZ 37.5-25 MG: 37.5-25 | 30 days supply | Qty: 60 | Fill #0

## 2016-07-02 MED FILL — AMLODIPINE BESYLATE 10 MG T: 10 | 30 days supply | Qty: 30 | Fill #0

## 2016-07-02 MED FILL — LABETALOL HCL 300 MG TABLET: 300 | 30 days supply | Qty: 90 | Fill #0

## 2016-07-02 MED FILL — metFORMIN HCL 500 MG TABS: 500 | 30 days supply | Qty: 30 | Fill #0

## 2016-07-02 MED FILL — ATORVASTATIN 10 MG TABLET: 10 | 30 days supply | Qty: 30 | Fill #0

## 2016-07-03 ENCOUNTER — Emergency Department (HOSPITAL_COMMUNITY): Payer: Medicaid Other

## 2016-07-03 ENCOUNTER — Encounter (HOSPITAL_COMMUNITY): Payer: Self-pay | Admitting: Emergency Medicine

## 2016-07-03 ENCOUNTER — Inpatient Hospital Stay (HOSPITAL_COMMUNITY)
Admission: EM | Admit: 2016-07-03 | Discharge: 2016-07-05 | DRG: 684 | Disposition: A | Discharge status: Home / Self Care | Payer: Medicaid Other | Attending: Family Medicine | Admitting: Family Medicine

## 2016-07-03 DIAGNOSIS — E860 Dehydration: Secondary | ICD-10-CM | POA: Diagnosis present

## 2016-07-03 DIAGNOSIS — E669 Obesity, unspecified: Secondary | ICD-10-CM

## 2016-07-03 DIAGNOSIS — N183 Chronic kidney disease, stage 3 unspecified: Secondary | ICD-10-CM | POA: Diagnosis present

## 2016-07-03 DIAGNOSIS — I10 Essential (primary) hypertension: Secondary | ICD-10-CM | POA: Diagnosis not present

## 2016-07-03 DIAGNOSIS — D649 Anemia, unspecified: Secondary | ICD-10-CM | POA: Diagnosis present

## 2016-07-03 DIAGNOSIS — G3184 Mild cognitive impairment, so stated: Secondary | ICD-10-CM | POA: Diagnosis present

## 2016-07-03 DIAGNOSIS — N179 Acute kidney failure, unspecified: Secondary | ICD-10-CM | POA: Diagnosis not present

## 2016-07-03 DIAGNOSIS — F32A Depression, unspecified: Secondary | ICD-10-CM | POA: Diagnosis present

## 2016-07-03 DIAGNOSIS — E785 Hyperlipidemia, unspecified: Secondary | ICD-10-CM | POA: Diagnosis present

## 2016-07-03 DIAGNOSIS — Z7984 Long term (current) use of oral hypoglycemic drugs: Secondary | ICD-10-CM

## 2016-07-03 DIAGNOSIS — Z6834 Body mass index (BMI) 34.0-34.9, adult: Secondary | ICD-10-CM

## 2016-07-03 DIAGNOSIS — E1169 Type 2 diabetes mellitus with other specified complication: Secondary | ICD-10-CM | POA: Diagnosis not present

## 2016-07-03 DIAGNOSIS — F329 Major depressive disorder, single episode, unspecified: Secondary | ICD-10-CM | POA: Diagnosis present

## 2016-07-03 DIAGNOSIS — G4733 Obstructive sleep apnea (adult) (pediatric): Secondary | ICD-10-CM | POA: Diagnosis present

## 2016-07-03 DIAGNOSIS — Z79899 Other long term (current) drug therapy: Secondary | ICD-10-CM

## 2016-07-03 DIAGNOSIS — Z8673 Personal history of transient ischemic attack (TIA), and cerebral infarction without residual deficits: Secondary | ICD-10-CM

## 2016-07-03 DIAGNOSIS — I129 Hypertensive chronic kidney disease with stage 1 through stage 4 chronic kidney disease, or unspecified chronic kidney disease: Secondary | ICD-10-CM | POA: Diagnosis present

## 2016-07-03 DIAGNOSIS — E1122 Type 2 diabetes mellitus with diabetic chronic kidney disease: Secondary | ICD-10-CM | POA: Diagnosis present

## 2016-07-03 HISTORY — DX: Cerebral infarction, unspecified: I63.9

## 2016-07-03 LAB — URINALYSIS, ROUTINE W REFLEX MICROSCOPIC
Bilirubin Urine: NEGATIVE
GLUCOSE, UA: NEGATIVE mg/dL
HGB URINE DIPSTICK: NEGATIVE
Ketones, ur: 5 mg/dL — AB
NITRITE: NEGATIVE
PH: 5 (ref 5.0–8.0)
Protein, ur: NEGATIVE mg/dL
Specific Gravity, Urine: 1.015 (ref 1.005–1.030)

## 2016-07-03 LAB — CBC WITH DIFFERENTIAL/PLATELET
Basophils Absolute: 0.1 10*3/uL (ref 0.0–0.1)
Basophils Relative: 1 %
EOS ABS: 0.2 10*3/uL (ref 0.0–0.7)
EOS PCT: 2 %
HCT: 33.4 % — ABNORMAL LOW (ref 39.0–52.0)
HEMOGLOBIN: 11 g/dL — AB (ref 13.0–17.0)
Lymphocytes Relative: 23 %
Lymphs Abs: 2.3 10*3/uL (ref 0.7–4.0)
MCH: 29.4 pg (ref 26.0–34.0)
MCHC: 32.9 g/dL (ref 30.0–36.0)
MCV: 89.3 fL (ref 78.0–100.0)
Monocytes Absolute: 0.7 10*3/uL (ref 0.1–1.0)
Monocytes Relative: 7 %
NEUTROS PCT: 67 %
Neutro Abs: 6.7 10*3/uL (ref 1.7–7.7)
PLATELETS: 305 10*3/uL (ref 150–400)
RBC: 3.74 MIL/uL — AB (ref 4.22–5.81)
RDW: 15.4 % (ref 11.5–15.5)
WBC: 10 10*3/uL (ref 4.0–10.5)

## 2016-07-03 LAB — COMPREHENSIVE METABOLIC PANEL
ALK PHOS: 44 U/L (ref 38–126)
ALT: 11 U/L — AB (ref 17–63)
AST: 17 U/L (ref 15–41)
Albumin: 3.9 g/dL (ref 3.5–5.0)
Anion gap: 12 (ref 5–15)
BILIRUBIN TOTAL: 0.4 mg/dL (ref 0.3–1.2)
BUN: 25 mg/dL — AB (ref 6–20)
CO2: 25 mmol/L (ref 22–32)
CREATININE: 3.47 mg/dL — AB (ref 0.61–1.24)
Calcium: 9.7 mg/dL (ref 8.9–10.3)
Chloride: 101 mmol/L (ref 101–111)
GFR, EST AFRICAN AMERICAN: 23 mL/min — AB (ref >60)
GFR, EST NON AFRICAN AMERICAN: 20 mL/min — AB (ref >60)
Glucose, Bld: 165 mg/dL — ABNORMAL HIGH (ref 65–99)
Potassium: 3.8 mmol/L (ref 3.5–5.1)
Sodium: 138 mmol/L (ref 135–145)
TOTAL PROTEIN: 7.7 g/dL (ref 6.5–8.1)

## 2016-07-03 LAB — SODIUM, URINE, RANDOM: SODIUM UR: 64 mmol/L

## 2016-07-03 LAB — GLUCOSE, CAPILLARY: Glucose-Capillary: 91 mg/dL (ref 65–99)

## 2016-07-03 LAB — CREATININE, URINE, RANDOM: CREATININE, URINE: 253.91 mg/dL

## 2016-07-03 LAB — URIC ACID: URIC ACID, SERUM: 9.4 mg/dL — AB (ref 4.4–7.6)

## 2016-07-03 MED ORDER — ACETAMINOPHEN 325 MG PO TABS: 650.0000 mg | ORAL_TABLET | Freq: Four times a day (QID) | ORAL | Status: DC | PRN

## 2016-07-03 MED ORDER — SODIUM CHLORIDE 0.9 % IV BOLUS (SEPSIS)
1000.0000 mL | Freq: Once | INTRAVENOUS | Status: AC
  Administered 2016-07-03: 1000 mL via INTRAVENOUS

## 2016-07-03 MED ORDER — LABETALOL HCL 100 MG PO TABS
300.0000 mg | ORAL_TABLET | Freq: Three times a day (TID) | ORAL | Status: DC
  Administered 2016-07-03 – 2016-07-05 (×5): 300 mg via ORAL
  Filled 2016-07-03 (×5): qty 3

## 2016-07-03 MED ORDER — ONDANSETRON HCL 4 MG/2ML IJ SOLN: 4.0000 mg | Freq: Four times a day (QID) | INTRAMUSCULAR | Status: DC | PRN

## 2016-07-03 MED ORDER — ACETAMINOPHEN 650 MG RE SUPP: 650.0000 mg | Freq: Four times a day (QID) | RECTAL | Status: DC | PRN

## 2016-07-03 MED ORDER — HEPARIN SODIUM (PORCINE) 5000 UNIT/ML IJ SOLN
5000.0000 [IU] | Freq: Three times a day (TID) | INTRAMUSCULAR | Status: DC
  Administered 2016-07-03 – 2016-07-04 (×4): 5000 [IU] via SUBCUTANEOUS
  Filled 2016-07-03 (×4): qty 1

## 2016-07-03 MED ORDER — AMLODIPINE BESYLATE 10 MG PO TABS
10.0000 mg | ORAL_TABLET | Freq: Every day | ORAL | Status: DC
  Administered 2016-07-04 – 2016-07-05 (×2): 10 mg via ORAL
  Filled 2016-07-03 (×2): qty 1

## 2016-07-03 MED ORDER — SERTRALINE HCL 50 MG PO TABS
50.0000 mg | ORAL_TABLET | Freq: Every day | ORAL | Status: DC
  Administered 2016-07-04 – 2016-07-05 (×2): 50 mg via ORAL
  Filled 2016-07-03 (×2): qty 1

## 2016-07-03 MED ORDER — HYDRALAZINE HCL 20 MG/ML IJ SOLN: 10.0000 mg | INTRAMUSCULAR | Status: DC | PRN

## 2016-07-03 MED ORDER — INSULIN ASPART 100 UNIT/ML ~~LOC~~ SOLN: 0.0000 [IU] | Freq: Three times a day (TID) | SUBCUTANEOUS | Status: DC

## 2016-07-03 MED ORDER — INSULIN ASPART 100 UNIT/ML ~~LOC~~ SOLN: 0.0000 [IU] | Freq: Every day | SUBCUTANEOUS | Status: DC

## 2016-07-03 MED ORDER — SODIUM CHLORIDE 0.9 % IV SOLN
INTRAVENOUS | Status: DC
  Administered 2016-07-03 – 2016-07-05 (×7): via INTRAVENOUS

## 2016-07-03 MED ORDER — ATORVASTATIN CALCIUM 10 MG PO TABS
10.0000 mg | ORAL_TABLET | Freq: Every day | ORAL | Status: DC
  Administered 2016-07-04: 10 mg via ORAL
  Filled 2016-07-03: qty 1

## 2016-07-03 MED ORDER — ONDANSETRON HCL 4 MG PO TABS: 4.0000 mg | ORAL_TABLET | Freq: Four times a day (QID) | ORAL | Status: DC | PRN

## 2016-07-03 NOTE — ED Provider Notes (Signed)
[facility] DEPT Provider Note   CSN: [id number] Arrival date & time: 07/03/16  1506     History   Chief Complaint Chief Complaint  Patient presents with  . Abnormal Lab    HPI Adam Vincent is a 46 y.o. male.  HPI   46 year old male presents with concern of elevated creatinine from his PCPs office. Family denies any medication changes, any history of diarrhea or dehydration. They do report he has had some intermittent urinary retention, which has appeared worse in the last few days. By history is unclear if this is urinary retention versus decreased urination. Not taking NSAIDs, no diarrhea, no emesis. No infectious symptoms or concerns.  Reports last urination was last night.  Doesn't necessarily feel the urge to urinate now.   Past Medical History:  Diagnosis Date  . Diabetes mellitus   . Hypertension   . Stroke [facility])     Patient Active Problem List   Diagnosis Date Noted  . Acute renal failure ([place]) 07/03/2016  . Normocytic anemia 07/03/2016  . ICH (intracerebral hemorrhage) ([place]) - R thalamic d/t HTN emergency 02/26/2016  . Depression 02/26/2016  . OSA (obstructive sleep apnea) 02/26/2016  . Hyperlipidemia 02/26/2016  . Hydrocephalus   . Cerebrovascular accident (CVA) ([place]) - B watershed d/t strict BP control   . Morbid obesity ([place])   . Marijuana abuse   . Benign essential HTN   . Diabetes mellitus type 2 in obese ([place])   . Fever   . Acute blood loss anemia   . Dysphagia   . Stage 3 chronic kidney disease   . Lethargy   . Altered mental state   . Uncontrolled hypertension     Past Surgical History:  Procedure Laterality Date  . ABSCESS DRAINAGE     Left leg       Home Medications    Prior to Admission medications   Medication Sig Start Date End Date Taking? Authorizing Provider  amLODipine (NORVASC) 10 MG tablet Take 1 tablet (10 mg total) by mouth daily. 02/27/16  Yes Donzetta Starch, NP  atorvastatin (LIPITOR) 10 MG tablet Take 1 tablet (10  mg total) by mouth daily at 6 PM. 02/26/16  Yes Biby, Massie Kluver, NP  labetalol (NORMODYNE) 300 MG tablet Take 1 tablet (300 mg total) by mouth 3 (three) times daily. 02/26/16  Yes Donzetta Starch, NP  metFORMIN (GLUCOPHAGE) 500 MG tablet Take 1 tablet (500 mg total) by mouth daily with breakfast. 02/27/16  Yes Donzetta Starch, NP  pantoprazole sodium (PROTONIX) 40 mg/20 mL PACK Take 20 mLs (40 mg total) by mouth daily. 02/27/16  Yes Donzetta Starch, NP  sertraline (ZOLOFT) 50 MG tablet Take 1 tablet (50 mg total) by mouth daily. 02/27/16  Yes Donzetta Starch, NP  triamcinolone cream (KENALOG) 0.1 % Apply topically.   Yes [provider]  triamterene-hydrochlorothiazide (MAXZIDE-25) 37.5-25 MG tablet Take 2 tablets by mouth every morning. 07/02/16  Yes [provider]  Vitamin D, Ergocalciferol, (DRISDOL) 50000 units CAPS capsule Take 50,000 Units by mouth every 7 (seven) days.   Yes [provider]    Family History No family history on file.  Social History Social History  Substance Use Topics  . Smoking status: Never Smoker  . Smokeless tobacco: Never Used  . Alcohol use No     Allergies   Patient has no known allergies.   Review of Systems Review of Systems  Constitutional: Negative for fever.  HENT: Negative for  sore throat.   Eyes: Negative for visual disturbance.  Respiratory: Negative for shortness of breath.   Cardiovascular: Negative for chest pain.  Gastrointestinal: Negative for abdominal pain.  Genitourinary: Positive for difficulty urinating.  Musculoskeletal: Negative for back pain and neck stiffness.  Skin: Negative for rash.  Neurological: Negative for syncope and headaches.     Physical Exam Updated Vital Signs BP (!) 149/92 (BP Location: Right Arm)   Pulse 66   Temp 98.6 F (37 C) (Oral)   Resp 20   Ht 6\' 1"  (1.854 m)   Wt 116.4 kg (256 lb 9.9 oz)   SpO2 99%   BMI 33.86 kg/m   Physical Exam  Constitutional: He is oriented to  person, place, and time. He appears well-developed and well-nourished. No distress.  HENT:  Head: Normocephalic and atraumatic.  Eyes: Conjunctivae and EOM are normal.  Neck: Normal range of motion.  Cardiovascular: Normal rate, regular rhythm, normal heart sounds and intact distal pulses.  Exam reveals no gallop and no friction rub.   No murmur heard. Pulmonary/Chest: Effort normal and breath sounds normal. No respiratory distress. He has no wheezes. He has no rales.  Abdominal: Soft. He exhibits no distension. There is tenderness (mild suprapubic). There is no guarding.  Musculoskeletal: He exhibits no edema.  Neurological: He is alert and oriented to person, place, and time.  Skin: Skin is warm and dry. He is not diaphoretic.  Nursing note and vitals reviewed.    ED Treatments / Results  Labs (all labs ordered are listed, but only abnormal results are displayed) Labs Reviewed  COMPREHENSIVE METABOLIC PANEL - Abnormal; Notable for the following:       Result Value   Glucose, Bld 165 (*)    BUN 25 (*)    Creatinine, Ser 3.47 (*)    ALT 11 (*)    GFR calc non Af Amer 20 (*)    GFR calc Af Amer 23 (*)    All other components within normal limits  CBC WITH DIFFERENTIAL/PLATELET - Abnormal; Notable for the following:    RBC 3.74 (*)    Hemoglobin 11.0 (*)    HCT 33.4 (*)    All other components within normal limits  URINALYSIS, ROUTINE W REFLEX MICROSCOPIC - Abnormal; Notable for the following:    APPearance HAZY (*)    Ketones, ur 5 (*)    Leukocytes, UA MODERATE (*)    Bacteria, UA FEW (*)    Squamous Epithelial / LPF 0-5 (*)    All other components within normal limits  URIC ACID - Abnormal; Notable for the following:    Uric Acid, Serum 9.4 (*)    All other components within normal limits  SODIUM, URINE, RANDOM  CREATININE, URINE, RANDOM  GLUCOSE, CAPILLARY  URIC ACID, RANDOM URINE  BASIC METABOLIC PANEL  CBC    EKG  EKG Interpretation None        Radiology US Renal  Result Date: 07/03/2016 CLINICAL DATA:  Acute renal failure. EXAM: RENAL / URINARY TRACT ULTRASOUND COMPLETE COMPARISON:  CT scan the from 2013 FINDINGS: Right Kidney: Length: 11.0 cm. Normal renal cortical thickness and echogenicity without focal lesions or hydronephrosis. Left Kidney: Length: 11.0 cm. Normal renal cortical thickness and echogenicity without focal lesions or hydronephrosis. Bladder: Poorly distended bladder. IMPRESSION: Normal renal ultrasound examination.  No hydronephrosis. Decompressed bladder. Electronically Signed   By: Marijo Sanes [name]   On: 07/03/2016 19:13    Procedures Procedures (including critical care time)  Medications  Ordered in ED Medications  amLODipine (NORVASC) tablet 10 mg (not administered)  atorvastatin (LIPITOR) tablet 10 mg (not administered)  labetalol (NORMODYNE) tablet 300 mg (300 mg Oral Given 07/03/16 2238)  sertraline (ZOLOFT) tablet 50 mg (not administered)  hydrALAZINE (APRESOLINE) injection 10 mg (not administered)  heparin injection 5,000 Units (5,000 Units Subcutaneous Given 07/03/16 2238)  0.9 %  sodium chloride infusion ( Intravenous New Bag/Given 07/04/16 0006)  acetaminophen (TYLENOL) tablet 650 mg (not administered)    Or  acetaminophen (TYLENOL) suppository 650 mg (not administered)  ondansetron (ZOFRAN) tablet 4 mg (not administered)    Or  ondansetron (ZOFRAN) injection 4 mg (not administered)  insulin aspart (novoLOG) injection 0-9 Units (not administered)  insulin aspart (novoLOG) injection 0-5 Units (0 Units Subcutaneous Not Given 07/03/16 2240)  sodium chloride 0.9 % bolus 1,000 mL (1,000 mLs Intravenous Transfusing/Transfer 07/03/16 1942)     Initial Impression / Assessment and Plan / ED Course  I have reviewed the triage vital signs and the nursing notes.  Pertinent labs & imaging results that were available during my care of the patient were reviewed by me and considered in my medical decision  making (see chart for details).     46 year old male presents with concern of elevated creatinine from his PCPs office.  Family denies any medication changes, any history of diarrhea or dehydration. They do report he has had some intermittent urinary retention, which has appeared worse in the last few days. By history is unclear if this is urinary retention versus decreased urination. Or bladder scan, urinalysis, urine studies, IV fluids, will admit patient to hospitalist service for further care of acute kidney injury and monitoring.  Final Clinical Impressions(s) / ED Diagnoses   Final diagnoses:  Acute renal failure [facility])    New Prescriptions Current Discharge Medication List       Gareth Morgan, MD 07/04/16 [id number]

## 2016-07-03 NOTE — ED Notes (Signed)
Pt was able to provide 250 ml urine output. Pt still denies pain or discomfort

## 2016-07-03 NOTE — ED Triage Notes (Signed)
Per daughter pt sent for evaluation related to "low creatinine." Pt denies symptoms or pain.

## 2016-07-03 NOTE — ED Notes (Signed)
Pt had gold dk green lt green & lavender drawn in triage.

## 2016-07-03 NOTE — H&P (Signed)
History and Physical   Adam Vincent TDD:[id number] DOB: 26-Mar-1970 DOA: 07/03/2016  Referring Vincent/NP/PA: Billy Fischer, Vincent, EDP PCP: Adam Locks, PA-C Patient coming from: Home  Chief Complaint: Abnormal labs  HPI: Adam Vincent is a 46 y.o. 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 He progressed with PT, had significant improvement in risk factor control, and was discharged from SNF to home with his daughter and SIL with improvement in left-sided deficits 6/15. Per the patient and family, he had been continuing to improve at home at the time of hospital follow up appointment with PCP 6/28. Recheck of labs demonstrated SCr of 3.69, so he was called and told to proceed to the ED. Both he and his family deny any recent illness, NSAID use, changes in medications, changes in urination, though he may be urinating infrequently. He has been losing a significant amount of weight and doesn't drink enough water, per his family, but otherwise feels very well.   In the ED he appeared well, vital signs stable, labs confirming creatinine elevation to 3.47, BUN 25, glucose 165, otherwise normal chemistries and mild normocytic anemia with hgb 11. There was no evidence of urinary retention, though he had not urinated since the previous evening and only urinated ~100cc urine in the ED.   Review of Systems: Denies fever, chills, changes in vision or hearing, headache, cough, sore throat, chest pain, palpitations, shortness of breath, abdominal pain, nausea, vomiting, changes in bowel habits, blood in stool, change in bladder habits, myalgias, arthralgias, and rash, and per HPI. All others reviewed and are negative.   Past Medical History:  Diagnosis Date  . Diabetes mellitus   . Hypertension   .  Stroke [facility])    Past Surgical History:  Procedure Laterality Date  . ABSCESS DRAINAGE     Left leg   - Non-smoker, used to smoke marijuana, no EtOH. Lives in [place] with daughter and SIL and granddaughter.   No Known Allergies No family history on file. - Family history otherwise reviewed and not pertinent.  Prior to Admission medications   Medication Sig Start Date  amLODipine (NORVASC) 10 MG tablet Take 1 tablet (10 mg total) by mouth daily. 02/27/16  atorvastatin (LIPITOR) 10 MG tablet Take 1 tablet (10 mg total) by mouth daily at 6 PM. 02/26/16  labetalol (NORMODYNE) 300 MG tablet Take 1 tablet (300 mg total) by mouth 3 (three) times daily. 02/26/16  metFORMIN (GLUCOPHAGE) 500 MG tablet Take 1 tablet (500 mg total) by mouth daily with breakfast. 02/27/16  Multiple Vitamin (MULTIVITAMIN WITH MINERALS) TABS tablet Take 1 tablet by mouth daily. 02/27/16  sertraline (ZOLOFT) 50 MG tablet Take 1 tablet (50 mg total) by mouth daily. 02/27/16    Physical Exam: Vitals:   07/03/16 1520 07/03/16 1521 07/03/16 1705  BP: 120/85  (!) 148/98  Pulse: 77  69  Resp: 18  20  Temp: 98.5 F (36.9 C)  98.1 F (36.7 C)  TempSrc: Oral  Oral  SpO2: 100%  100%  Weight:  115.7 kg (255 lb)   Height:  '6\' 1"'$  (1.854 m)    Constitutional: 46 y.o. male in no distress, calm demeanor Eyes: Lids and conjunctivae normal, PERRL ENMT: Mucous membranes are tacky. Posterior pharynx clear of any exudate  or lesions. Poor dentition.  Neck: normal, supple, no masses, no thyromegaly Respiratory: Non-labored breathing room air without accessory muscle use. Clear breath sounds to auscultation bilaterally Cardiovascular: Regular rate and rhythm, no murmurs, rubs, or gallops. No carotid bruits. No JVD. No LE edema. 1+ pedal pulses. Abdomen: Normoactive bowel sounds. No tenderness, non-distended, and no masses palpated. No hepatosplenomegaly. GU: No indwelling catheter Musculoskeletal: No clubbing / cyanosis. No joint  deformity upper and lower extremities. Good ROM, no contractures. Normal muscle tone.  Skin: Warm, dry. No rashes, wounds, or ulcers.  Neurologic: CN II-XII grossly intact with mild dysphasia. Gait not assessed. Subtle diminished strength in LLE with normal sensory exam.  Psychiatric: Alert and oriented x3. Normal judgment and insight. Cognitively impaired. Mood euthymic with congruent affect.   Labs on Admission: I have personally reviewed following labs and imaging studies  CBC:  Recent Labs Lab 07/03/16 1523  WBC 10.0  NEUTROABS 6.7  HGB 11.0*  HCT 33.4*  MCV 89.3  PLT 093   Basic Metabolic Panel:  Recent Labs Lab 07/03/16 1523  NA 138  K 3.8  CL 101  CO2 25  GLUCOSE 165*  BUN 25*  CREATININE 3.47*  CALCIUM 9.7   GFR: Estimated Creatinine Clearance: 36.2 mL/min (A) (by C-G formula based on SCr of 3.47 mg/dL (H)). Liver Function Tests:  Recent Labs Lab 07/03/16 1523  AST 17  ALT 11*  ALKPHOS 94  BILITOT 0.4  PROT 7.7  ALBUMIN 3.9   Urine analysis: Pending  Assessment/Plan Principal Problem:   Acute renal failure (HCC) Active Problems:   Benign essential HTN   Diabetes mellitus type 2 in obese (HCC)   Stage 3 chronic kidney disease   Depression   Hyperlipidemia   Normocytic anemia   Acute renal failure on stage III CKD: SCr 3.47 on admission from 1.23 in Feb 2018. Possibly pre-renal with evidence of mild dehydration on presentation combined with combination diuretic use. Postvoid residual is negative. Has history of HTN w/complications. Albumin:Creatinine ratio yesterday w/PCP was only 12.4.  - Will give IVF - Monitor BMP and UOP - Ordered urine lytes, FEUrate  - Renal U/S ordered - Continue home labetalol and prn hydralazine. Holding triamterene and HCTZ.  Cerebrovascular disease s/p hemorrhagic CVA Jan 2018:  - No antiplatelet prescribed  - Continue anti-HTN, statin  HTN: Chronic, stable, improving.  - Medications per PCP note yesterday  (different from unreconciled med list in Epic): Triamterene/HCTZ 75/'50mg'$  (holding), Labetalol '200mg'$  TID (ordered), Norvasc '10mg'$  daily (ordered) - Prn hydralazine ordered  MDD: Worsened since CVA, mood stable. - Continue zoloft '50mg'$   NIDT2DM: Well-controlled, HbA1c 5.4% yesterday, improving from 7.5% in Feb 2018 in the setting of intentional weight loss (~100lbs in 6 months).  - Hold metformin - Sensitive SSI ordered due to renal impairment, titrate as needed  Hyperlipidemia: Well-controlled, LDL 59 yesterday - Continue lipitor  Morbid obesity: Has improved dramatically since CVA. BMI at discharge was reportedly 46, now 34.  - Nutrition consult to ensure protein calorie requirements are met.   OSA:  - Continue routine CPAP qHS  DVT prophylaxis: Heparin  Code Status: Full confirmed at admission  Family Communication: Daughter, SIL at bedside Disposition Plan: [place] home once work up and treatment completed Consults called: None  Admission status: Observation    Vance Gather, Vincent [facility] Pager [phone]  If 7PM-7AM, please contact night-coverage [email or web address] Password [facility] 07/03/2016, 6:52 PM

## 2016-07-03 NOTE — ED Notes (Signed)
Report given to Randal on 3W

## 2016-07-03 NOTE — ED Notes (Signed)
US at bedside

## 2016-07-04 DIAGNOSIS — I10 Essential (primary) hypertension: Secondary | ICD-10-CM | POA: Diagnosis not present

## 2016-07-04 DIAGNOSIS — E860 Dehydration: Secondary | ICD-10-CM | POA: Diagnosis present

## 2016-07-04 DIAGNOSIS — Z7984 Long term (current) use of oral hypoglycemic drugs: Secondary | ICD-10-CM | POA: Diagnosis not present

## 2016-07-04 DIAGNOSIS — N183 Chronic kidney disease, stage 3 (moderate): Secondary | ICD-10-CM | POA: Diagnosis present

## 2016-07-04 DIAGNOSIS — E785 Hyperlipidemia, unspecified: Secondary | ICD-10-CM | POA: Diagnosis present

## 2016-07-04 DIAGNOSIS — E1122 Type 2 diabetes mellitus with diabetic chronic kidney disease: Secondary | ICD-10-CM | POA: Diagnosis present

## 2016-07-04 DIAGNOSIS — Z8673 Personal history of transient ischemic attack (TIA), and cerebral infarction without residual deficits: Secondary | ICD-10-CM | POA: Diagnosis not present

## 2016-07-04 DIAGNOSIS — N179 Acute kidney failure, unspecified: Secondary | ICD-10-CM | POA: Diagnosis present

## 2016-07-04 DIAGNOSIS — Z79899 Other long term (current) drug therapy: Secondary | ICD-10-CM | POA: Diagnosis not present

## 2016-07-04 DIAGNOSIS — G3184 Mild cognitive impairment, so stated: Secondary | ICD-10-CM | POA: Diagnosis present

## 2016-07-04 DIAGNOSIS — I129 Hypertensive chronic kidney disease with stage 1 through stage 4 chronic kidney disease, or unspecified chronic kidney disease: Secondary | ICD-10-CM | POA: Diagnosis present

## 2016-07-04 DIAGNOSIS — D649 Anemia, unspecified: Secondary | ICD-10-CM | POA: Diagnosis present

## 2016-07-04 DIAGNOSIS — E1169 Type 2 diabetes mellitus with other specified complication: Secondary | ICD-10-CM | POA: Diagnosis not present

## 2016-07-04 DIAGNOSIS — G4733 Obstructive sleep apnea (adult) (pediatric): Secondary | ICD-10-CM | POA: Diagnosis present

## 2016-07-04 DIAGNOSIS — Z6834 Body mass index (BMI) 34.0-34.9, adult: Secondary | ICD-10-CM | POA: Diagnosis not present

## 2016-07-04 DIAGNOSIS — F329 Major depressive disorder, single episode, unspecified: Secondary | ICD-10-CM | POA: Diagnosis present

## 2016-07-04 LAB — CBC
HCT: 29.1 % — ABNORMAL LOW (ref 39.0–52.0)
HEMOGLOBIN: 9.6 g/dL — AB (ref 13.0–17.0)
MCH: 29.2 pg (ref 26.0–34.0)
MCHC: 33 g/dL (ref 30.0–36.0)
MCV: 88.4 fL (ref 78.0–100.0)
Platelets: 265 10*3/uL (ref 150–400)
RBC: 3.29 MIL/uL — AB (ref 4.22–5.81)
RDW: 15.4 % (ref 11.5–15.5)
WBC: 9.5 10*3/uL (ref 4.0–10.5)

## 2016-07-04 LAB — BASIC METABOLIC PANEL
ANION GAP: 9 (ref 5–15)
BUN: 26 mg/dL — ABNORMAL HIGH (ref 6–20)
CALCIUM: 8.7 mg/dL — AB (ref 8.9–10.3)
CO2: 26 mmol/L (ref 22–32)
Chloride: 102 mmol/L (ref 101–111)
Creatinine, Ser: 3.31 mg/dL — ABNORMAL HIGH (ref 0.61–1.24)
GFR calc non Af Amer: 21 mL/min — ABNORMAL LOW (ref >60)
GFR, EST AFRICAN AMERICAN: 24 mL/min — AB (ref >60)
Glucose, Bld: 92 mg/dL (ref 65–99)
Potassium: 3.5 mmol/L (ref 3.5–5.1)
SODIUM: 137 mmol/L (ref 135–145)

## 2016-07-04 LAB — GLUCOSE, CAPILLARY
GLUCOSE-CAPILLARY: 90 mg/dL (ref 65–99)
GLUCOSE-CAPILLARY: 97 mg/dL (ref 65–99)
GLUCOSE-CAPILLARY: 90 mg/dL (ref 65–99)
Glucose-Capillary: 95 mg/dL (ref 65–99)

## 2016-07-04 LAB — URIC ACID, RANDOM URINE: Uric Acid, Urine: 32.4 mg/dL

## 2016-07-04 NOTE — Progress Notes (Signed)
Pt has refused CPAP for tonight.  Pt to notify RT if he changes his mind.  RT to monitor and assess as needed.

## 2016-07-04 NOTE — Progress Notes (Addendum)
PROGRESS NOTE  Adam Vincent  FAO:[id number] DOB: [birth year] DOA: 07/03/2016 PCP: Darden Amber, PA   Brief Narrative: Adam Vincent is a 46 y.o. 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 He progressed with PT, had significant improvement in risk factor control, and was discharged from SNF to home with his daughter and SIL with improvement in left-sided deficits 6/15. Per the patient and family, he had been continuing to improve at home at the time of hospital follow up appointment with PCP 6/28. Recheck of labs demonstrated SCr of 3.69, so he was called and told to proceed to the ED. Both he and his family deny any recent illness, NSAID use, changes in medications. They believe he may be dehydrated as he doesn't drink much water and has been losing a significant amount of weight, and hasn't been urinating much, but he denied and LUTS/BOO symptoms.   In the ED he appeared well, vital signs stable, labs confirming creatinine elevation to 3.47, BUN 25, glucose 165, otherwise normal chemistries and mild normocytic anemia with hgb 11. Bladder scan showed ~70cc and pt had no retention. Renal U/S was essentially normal. FENa and FEUrea are consistent with pre-renal azotemia, though this has only modestly improved since admission. Charted urine output is inaccurate due to occasional incontinence.   Assessment & Plan: Principal Problem:   Acute renal failure (HCC) Active Problems:   Benign essential HTN   Diabetes mellitus type 2 in obese [facility])   Stage 3 chronic kidney disease   Depression   Hyperlipidemia   Normocytic anemia  Acute renal failure on stage III CKD: SCr 3.47 on admission from 1.23 in Feb 2018. Possibly pre-renal with evidence of mild dehydration on presentation  combined with combination diuretic use. Postvoid residual is negative. Has history of HTN w/complications. Albumin:Creatinine ratio 6/28 w/PCP was only 12.4. FENa/FEUrea low. Renal U/S w/ 11cm bilaterally, normal cortical thickness/echogenicity, no hydro.  - Continue IVF. If no improvement by AM, will formally consult nephrology.  - Continue to monitor BMP and UOP - Holding triamterene and HCTZ.  Cerebrovascular disease s/p hemorrhagic CVA Jan 2018:  - No antiplatelet prescribed  - Continue anti-HTN, statin  HTN: Chronic, stable. At goal today. - Medications: Triamterene/HCTZ 75/'50mg'$  (holding), Labetalol '200mg'$  TID (ordered), Norvasc '10mg'$  daily (ordered) - Prn hydralazine ordered  MDD: Worsened since CVA, mood currently stable. - Continue zoloft '50mg'$   NIDT2DM: Well-controlled, HbA1c 5.4% 6/28, improving from 7.5% in Feb 2018 in the setting of intentional weight loss (~100lbs in 6 months).  - Hold metformin - Sensitive SSI ordered due to renal impairment, titrate as needed. If CBGs remain wnl, will DC order set and monitor just with AM labs.  Hyperlipidemia: Well-controlled, LDL 59 yesterday - Continue lipitor  Morbid obesity: Has improved dramatically since CVA. BMI at discharge was reportedly 46, now 34.  - Nutrition consult to ensure protein calorie requirements are met.   OSA:  - Continue routine CPAP qHS  DVT prophylaxis: Heparin Code Status: Full Family Communication: Wife by phone Disposition Plan: Renal failure failed to resolve as expected. Continue inpatient management.   Consultants:   Nephrology, Dr. Jonnie Finner by phone 6/30  Procedures:   None  Antimicrobials:  None   Subjective: Pt sleeping soundly, awakens with no  complaints. No abd pain, fever, N/V, flank pain, dyspnea, swelling, chest pain.  Objective: BP 123/84 (BP Location: Right Arm)   Pulse 61   Temp 98.3 F (36.8 C) (Oral)   Resp 18   Ht '6\' 1"'$  (1.854 m)   Wt 116.4 kg (256 lb 9.9 oz)    SpO2 100%   BMI 33.86 kg/m   General exam: 46 y.o. male in no distress Respiratory system: Non-labored breathing room air. Clear to auscultation bilaterally.  Cardiovascular system: Regular rate and rhythm. No murmur, rub, or gallop. No JVD, and no pedal edema. Gastrointestinal system: Abdomen soft, non-tender, non-distended, with normoactive bowel sounds. No organomegaly or masses felt. Central nervous system: Alert and oriented. Mild dysphasia and LLE weakness.  Extremities: Warm, no deformities Skin: No rashes, lesions no ulcers Psychiatry: Judgement and insight appear fair. Mild cognitive impairment noted. Mood & affect appropriate.   Data Reviewed: I have personally reviewed following labs and imaging studies  CBC:  Recent Labs Lab 07/03/16 1523 07/04/16 0429  WBC 10.0 9.5  NEUTROABS 6.7  --   HGB 11.0* 9.6*  HCT 33.4* 29.1*  MCV 89.3 88.4  PLT 305 419   Basic Metabolic Panel:  Recent Labs Lab 07/03/16 1523 07/04/16 0429  NA 138 137  K 3.8 3.5  CL 101 102  CO2 25 26  GLUCOSE 165* 92  BUN 25* 26*  CREATININE 3.47* 3.31*  CALCIUM 9.7 8.7*   GFR: Estimated Creatinine Clearance: 38.1 mL/min (A) (by C-G formula based on SCr of 3.31 mg/dL (H)). Liver Function Tests:  Recent Labs Lab 07/03/16 1523  AST 17  ALT 11*  ALKPHOS 44  BILITOT 0.4  PROT 7.7  ALBUMIN 3.9   No results for input(s): LIPASE, AMYLASE in the last 168 hours. No results for input(s): AMMONIA in the last 168 hours. Coagulation Profile: No results for input(s): INR, PROTIME in the last 168 hours. Cardiac Enzymes: No results for input(s): CKTOTAL, CKMB, CKMBINDEX, TROPONINI in the last 168 hours. BNP (last 3 results) No results for input(s): PROBNP in the last 8760 hours. HbA1C: No results for input(s): HGBA1C in the last 72 hours. CBG:  Recent Labs Lab 07/03/16 2135 07/04/16 0751 07/04/16 1238  GLUCAP 91 90 97   Lipid Profile: No results for input(s): CHOL, HDL, LDLCALC, TRIG,  CHOLHDL, LDLDIRECT in the last 72 hours. Thyroid Function Tests: No results for input(s): TSH, T4TOTAL, FREET4, T3FREE, THYROIDAB in the last 72 hours. Anemia Panel: No results for input(s): VITAMINB12, FOLATE, FERRITIN, TIBC, IRON, RETICCTPCT in the last 72 hours. Urine analysis:    Component Value Date/Time   COLORURINE YELLOW 07/03/2016 1723   APPEARANCEUR HAZY (A) 07/03/2016 1723   LABSPEC 1.015 07/03/2016 1723   PHURINE 5.0 07/03/2016 1723   GLUCOSEU NEGATIVE 07/03/2016 1723   HGBUR NEGATIVE 07/03/2016 1723   BILIRUBINUR NEGATIVE 07/03/2016 1723   KETONESUR 5 (A) 07/03/2016 1723   PROTEINUR NEGATIVE 07/03/2016 1723   UROBILINOGEN 0.2 02/09/2011 1103   NITRITE NEGATIVE 07/03/2016 1723   LEUKOCYTESUR MODERATE (A) 07/03/2016 1723   No results found for this or any previous visit (from the past 240 hour(s)).    Radiology Studies: US Renal  Result Date: 07/03/2016 CLINICAL DATA:  Acute renal failure. EXAM: RENAL / URINARY TRACT ULTRASOUND COMPLETE COMPARISON:  CT scan the from 2013 FINDINGS: Right Kidney: Length: 11.0 cm. Normal renal cortical thickness and echogenicity without focal lesions or hydronephrosis. Left Kidney: Length: 11.0 cm. Normal renal cortical thickness and echogenicity without focal lesions or hydronephrosis.  Bladder: Poorly distended bladder. IMPRESSION: Normal renal ultrasound examination.  No hydronephrosis. Decompressed bladder. Electronically Signed   By: Marijo Sanes [name]   On: 07/03/2016 19:13    Scheduled Meds: . amLODipine  10 mg Oral Daily  . atorvastatin  10 mg Oral q1800  . heparin  5,000 Units Subcutaneous Q8H  . insulin aspart  0-5 Units Subcutaneous QHS  . insulin aspart  0-9 Units Subcutaneous TID WC  . labetalol  300 mg Oral TID  . sertraline  50 mg Oral Daily   Continuous Infusions: . sodium chloride 150 mL/hr at 07/04/16 1317     LOS: 0 days   Time spent: 25 minutes.  Vance Gather, MD [facility] Pager [phone]  If  7PM-7AM, please contact night-coverage [email or web address] Password [email or web address] 07/04/2016, 3:22 PM

## 2016-07-05 LAB — RENAL FUNCTION PANEL
ALBUMIN: 3.2 g/dL — AB (ref 3.5–5.0)
Anion gap: 8 (ref 5–15)
BUN: 23 mg/dL — AB (ref 6–20)
CALCIUM: 8.6 mg/dL — AB (ref 8.9–10.3)
CO2: 24 mmol/L (ref 22–32)
CREATININE: 2.76 mg/dL — AB (ref 0.61–1.24)
Chloride: 107 mmol/L (ref 101–111)
GFR calc Af Amer: 31 mL/min — ABNORMAL LOW (ref >60)
GFR, EST NON AFRICAN AMERICAN: 26 mL/min — AB (ref >60)
GLUCOSE: 83 mg/dL (ref 65–99)
PHOSPHORUS: 3.9 mg/dL (ref 2.5–4.6)
POTASSIUM: 3.7 mmol/L (ref 3.5–5.1)
SODIUM: 139 mmol/L (ref 135–145)

## 2016-07-05 LAB — URINE CULTURE

## 2016-07-05 LAB — GLUCOSE, CAPILLARY
GLUCOSE-CAPILLARY: 106 mg/dL — AB (ref 65–99)
GLUCOSE-CAPILLARY: 98 mg/dL (ref 65–99)

## 2016-07-05 NOTE — Care Management Note (Addendum)
Case Management Note  Patient Details  Name: DEMERIUS PODOLAK MRN: [id number] Date of Birth: [birth year]  Subjective/Objective:  Acute renal failure, HTN, DM, CKD, depression                  Action/Plan: Discharge Planning: NCM spoke to pt and states he lives at home with his dtr and her boyfriend. States his PCP has completed [place] [facility] through [facility] paperwork for him to have a aide in the home to assist with ADL's. He has a wheelchair and RW at home. Pt states he does not have a CPAP. Educated pt to follow up with his PCP at next appt to discuss CPAP and sleep study. Has appt per pt on 7/2 at 1230 pm. Will fax dc summary to pt's PCP for follow up on sleep study and CPAP.  PCP Darden Amber MD  Expected Discharge Date:  07/05/16               Expected Discharge Plan:  Home/Self Care  In-House Referral:  NA  Discharge planning Services  CM Consult  Post Acute Care Choice:  NA Choice offered to:  NA  DME Arranged:  N/A DME Agency:  NA  HH Arranged:  NA HH Agency:  NA  Status of Service:  Completed, signed off  If discussed at [place] of Stay Meetings, dates discussed:    Additional Comments:  Erenest Rasher, RN 07/05/2016, 1:49 PM

## 2016-07-05 NOTE — Discharge Summary (Signed)
Physician Discharge Summary  Adam Vincent DJS:[id number] DOB: 06-06-1970 DOA: 07/03/2016  PCP: Adam Amber, PA  Admit date: 07/03/2016 Discharge date: 07/05/2016  Admitted From: Home Disposition: Home   Recommendations for Outpatient Follow-up:  1. Follow up with PCP in the next week for recheck renal function and BP, holding triamterene/HCTZ and metformin.   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 He progressed with PT, had significant improvement in risk factor control, and was discharged from SNF to home with his daughter and SIL with improvement in left-sided deficits 6/15. Per the patient and family, he had been continuing to improve at home at the time of hospital follow up appointment with PCP 6/28. Recheck of labs demonstrated SCr of 3.69, so he was called and told to proceed to the ED. Both he and his family deny any recent illness, NSAID use, changes in medications. They believe he may be dehydrated as he doesn't drink much water and has been losing a significant amount of weight, and hasn't been urinating much, but he denied and LUTS/BOO symptoms.   In the ED he appeared well, vital signs stable, labs confirming creatinine elevation to 3.47, BUN 25, glucose 165, otherwise normal chemistries and mild normocytic anemia with hgb 11. Bladder scan showed ~70cc and pt had no retention. Renal U/S was essentially normal. FENa and FEUrea are consistent with pre-renal azotemia. Creatinine improved steadily and patient remains asymptomatic. He will be discharged with plan to follow up  within the next week.   Discharge Diagnoses:  Principal Problem:   Acute renal failure ([name]) Active Problems:   Benign essential HTN   Diabetes mellitus type 2 in obese [facility])   Stage 3 chronic kidney disease   Depression   Hyperlipidemia   Normocytic anemia  Acute renal failure on stage III CKD: SCr 3.47 on admission from 1.23 in Feb 2018. Possibly pre-renal with evidence of mild dehydration on presentation combined with combination diuretic use. Postvoid residual is negative. Has history of HTN w/complications. Albumin:Creatinine ratio 6/28 w/PCP was only 12.4. FENa/FEUrea low. Renal U/S w/ 11cm bilaterally, normal cortical thickness/echogenicity, no hydro.  - Improved with IV fluids, suspect prerenal. Ok to DC home per nephrology with recheck in next week. Push fluids, hold diuretics.  - Continue to monitor BMP  - Holding triamterene and HCTZ.  Cerebrovascular disease s/p hemorrhagic CVA Jan 2018: - No antiplatelet prescribed  - Continue anti-HTN, statin  HTN: Chronic, stable. At goal today. - Medications: Triamterene/HCTZ 75/'50mg'$  (holding), Labetalol '200mg'$  TID (ordered), Norvasc '10mg'$  daily (ordered) - Prn hydralazine ordered  MDD: Worsened since CVA, mood currently stable. - Continue zoloft '50mg'$   NIDT2DM:Well-controlled, HbA1c 5.4% 6/28, improving from 7.5% in Feb 2018 in the setting of intentional weight loss (~100lbs in 6 months).  - Hold metformin until follow up  Hyperlipidemia: Well-controlled, LDL 59 yesterday - Continue lipitor  Morbid obesity: Has improved dramatically since CVA. BMI at discharge was reportedly 46, now 34.  - Nutrition consult to ensure protein calorie requirements are met.   OSA reported by patient:  - States he doesn't wear CPAP at home. Defer to PCP.  Discharge Instructions Discharge Instructions    Discharge instructions    Complete by:  As directed    You were admitted for kidney failure which has improved with IV fluids and by  holding triamterene-HCTZ. After discussion with a nephrologist, it is recommended that you stop this medication until you follow up with your PCP, which you should schedule and be seen in the next week for repeat labs. Also stop taking metformin until you follow up. Based on your A1c, this should not cause a major problem with your blood sugar.   There was no evidence of intrinsic kidney problems on our work up, so it is expected that with holding the diuretic and making sure you drink plenty of fluids, you will recover. If you notice any decreased urine output or trouble tolerating fluids, seek medical attention.     Allergies as of 07/05/2016   No Known Allergies     Medication List    STOP taking these medications   metFORMIN 500 MG tablet Commonly known as:  GLUCOPHAGE   triamterene-hydrochlorothiazide 37.5-25 MG tablet Commonly known as:  MAXZIDE-25     TAKE these medications   amLODipine 10 MG tablet Commonly known as:  NORVASC Take 1 tablet (10 mg total) by mouth daily.   atorvastatin 10 MG tablet Commonly known as:  LIPITOR Take 1 tablet (10 mg total) by mouth daily at 6 PM.   labetalol 300 MG tablet Commonly known as:  NORMODYNE Take 1 tablet (300 mg total) by mouth 3 (three) times daily.   pantoprazole sodium 40 mg/20 mL Pack Commonly known as:  PROTONIX Take 20 mLs (40 mg total) by mouth daily.   sertraline 50 MG tablet Commonly known as:  ZOLOFT Take 1 tablet (50 mg total) by mouth daily.   triamcinolone cream 0.1 % Commonly known as:  KENALOG Apply topically.   Vitamin D (Ergocalciferol) 50000 units Caps capsule Commonly known as:  DRISDOL Take 50,000 Units by mouth every 7 (seven) days.      Follow-up Information    Adam Vincent, [place]. Schedule an appointment as soon as possible for a visit in 1 day(s).   Contact information: [place] [address] [phone]          No Known Allergies  Consultations:  Nephrology,  Dr. Jonnie Finner by phone  Procedures/Studies: US Renal  Result Date: 07/03/2016 CLINICAL DATA:  Acute renal failure. EXAM: RENAL / URINARY TRACT ULTRASOUND COMPLETE COMPARISON:  CT scan the from 2013 FINDINGS: Right Kidney: Length: 11.0 cm. Normal renal cortical thickness and echogenicity without focal lesions or hydronephrosis. Left Kidney: Length: 11.0 cm. Normal renal cortical thickness and echogenicity without focal lesions or hydronephrosis. Bladder: Poorly distended bladder. IMPRESSION: Normal renal ultrasound examination.  No hydronephrosis. Decompressed bladder. Electronically Signed   By: Marijo Sanes [name]   On: 07/03/2016 19:13   Subjective: Pt feels well, no complaints. Urinating "a lot" with IVF's running at 150cc/hr.   Discharge Exam: BP (!) 148/92 (BP Location: Right Arm)   Pulse 74   Temp 98.4 F (36.9 C) (Oral)   Resp 18   Ht '6\' 1"'$  (1.854 m)   Wt 116.4 kg (256 lb 9.9 oz)   SpO2 98%   BMI 33.86 kg/m   Gen: No distress Pulm: Clear and nonlabored on room air  CV: RRR, no murmur, no JVD, no edema GI: Soft, NT, ND, +BS  Neuro: Alert and oriented. Mild dysphasia and LLE weakness.  Ext: Warm, no deformities Psychiatry:  Judgement and insight appear fair. Mild cognitive impairment noted. Mood & affect appropriate.  Labs: Basic Metabolic Panel:  Recent Labs Lab 07/03/16 1523 07/04/16 0429 07/05/16 0402  NA 138 137 139  K 3.8 3.5 3.7  CL 101 102 107  CO2 '25 26 24  '$ GLUCOSE 165* 92 83  BUN 25* 26* 23*  CREATININE 3.47* 3.31* 2.76*  CALCIUM 9.7 8.7* 8.6*  PHOS  --   --  3.9   Liver Function Tests:  Recent Labs Lab 07/03/16 1523 07/05/16 0402  AST 17  --   ALT 11*  --   ALKPHOS 44  --   BILITOT 0.4  --   PROT 7.7  --   ALBUMIN 3.9 3.2*   CBC:  Recent Labs Lab 07/03/16 1523 07/04/16 0429  WBC 10.0 9.5  NEUTROABS 6.7  --   HGB 11.0* 9.6*  HCT 33.4* 29.1*  MCV 89.3 88.4  PLT 305 265   Urinalysis    Component Value Date/Time   COLORURINE YELLOW  07/03/2016 1723   APPEARANCEUR HAZY (A) 07/03/2016 1723   LABSPEC 1.015 07/03/2016 1723   PHURINE 5.0 07/03/2016 1723   [place] 07/03/2016 1723   HGBUR NEGATIVE 07/03/2016 1723   BILIRUBINUR NEGATIVE 07/03/2016 1723   KETONESUR 5 (A) 07/03/2016 1723   PROTEINUR NEGATIVE 07/03/2016 1723   UROBILINOGEN 0.2 02/09/2011 1103   NITRITE NEGATIVE 07/03/2016 1723   LEUKOCYTESUR MODERATE (A) 07/03/2016 1723   Time coordinating discharge: Approximately 40 minutes  Vance Gather, MD  [facility] 07/05/2016, 1:52 PM Pager [phone]

## 2016-07-06 MED FILL — SERTRALINE HCL 50 MG TABLET: 50 | 30 days supply | Qty: 30 | Fill #0

## 2016-07-29 ENCOUNTER — Encounter: Payer: Self-pay | Admitting: Diagnostic Neuroimaging

## 2016-07-29 ENCOUNTER — Ambulatory Visit (INDEPENDENT_AMBULATORY_CARE_PROVIDER_SITE_OTHER): Payer: Medicaid Other | Admitting: Diagnostic Neuroimaging

## 2016-07-29 VITALS — BP 133/91 | HR 79 | Ht 73.0 in | Wt 290.0 lb

## 2016-07-29 DIAGNOSIS — I610 Nontraumatic intracerebral hemorrhage in hemisphere, subcortical: Secondary | ICD-10-CM

## 2016-07-29 DIAGNOSIS — I10 Essential (primary) hypertension: Secondary | ICD-10-CM | POA: Diagnosis not present

## 2016-07-29 NOTE — Patient Instructions (Signed)

## 2016-07-29 NOTE — Progress Notes (Signed)
[facility]  PATIENT: Adam Vincent DOB: 1970-11-07  REFERRING CLINICIAN: Roosevelt Locks, PA HISTORY FROM: patient, daughter, chart review REASON FOR VISIT: new consult     HISTORICAL  CHIEF COMPLAINT:  Chief Complaint  Patient presents with  . Hospitalization Follow-up  . Cerebrovascular Accident    Doing better.   Using cane.  Lives at home with mother.  Here with daughter.  No PT now.    . ICH/ R thalamic stroke    HISTORY OF PRESENT ILLNESS:   46 year old male here for evaluation of right thalamic intracerebral hemorrhage. 02/03/16 patient woke up with severe headache. He noticed weakness in his left arm and left leg. Patient came to hospital for evaluation, was diagnosed with right thalamic intracerebral hemorrhage. Patient was admitted to neuro ICU for further evaluation and treatment. Patient was then transitioned to skilled nursing facility for rehabilitation.  Patient discharged from skilled nursing facility on 06/19/2016, now living back at home. Patient is doing well. He is able to walk up and down stairs independently. He uses a single-point cane. He is able to get out of bed by himself. He is able to take care of his personal hygiene needs for the most part with minor assistance. Recently he was able to repair a small meal including baked chicken. Patient having improved diabetes and blood pressure control. He is no longer on diabetic medications. Patient continues to have some memory loss, speech difficulty, dizziness. Left-sided weakness and numbness has significant improved.  Of note patient reports history of right facial Bell's palsy in 2012.   REVIEW OF SYSTEMS: Full 14 system review of systems performed and negative with exception of: Memory loss.  ALLERGIES: No Known Allergies  HOME MEDICATIONS: Outpatient Medications Prior to Visit  Medication Sig Dispense Refill  . amLODipine (NORVASC) 10 MG tablet Take 1 tablet (10 mg total) by mouth  daily.    Marland Kitchen atorvastatin (LIPITOR) 10 MG tablet Take 1 tablet (10 mg total) by mouth daily at 6 PM.    . labetalol (NORMODYNE) 300 MG tablet Take 1 tablet (300 mg total) by mouth 3 (three) times daily.    . sertraline (ZOLOFT) 50 MG tablet Take 1 tablet (50 mg total) by mouth daily.    Marland Kitchen triamcinolone cream (KENALOG) 0.1 % Apply topically.    . Vitamin D, Ergocalciferol, (DRISDOL) 50000 units CAPS capsule Take 50,000 Units by mouth every 7 (seven) days.    . pantoprazole sodium (PROTONIX) 40 mg/20 mL PACK Take 20 mLs (40 mg total) by mouth daily. (Patient not taking: Reported on 07/29/2016) 30 each    No facility-administered medications prior to visit.     PAST MEDICAL HISTORY: Past Medical History:  Diagnosis Date  . Diabetes mellitus   . Hypertension   . Stroke [facility])     PAST SURGICAL HISTORY: Past Surgical History:  Procedure Laterality Date  . ABSCESS DRAINAGE     Left leg    FAMILY HISTORY: History reviewed. No pertinent family history.  SOCIAL HISTORY:  Social History   Social History  . Marital status: Married    Spouse name: N/A  . Number of children: N/A  . Years of education: N/A   Occupational History  . Not on file.   Social History Main Topics  . Smoking status: Never Smoker  . Smokeless tobacco: Never Used  . Alcohol use No  . Drug use: Yes    Types: Marijuana  . Sexual activity: Not on file   Other Topics  Concern  . Not on file   Social History Narrative   Living home with mother.  Children 4.      PHYSICAL EXAM  GENERAL EXAM/CONSTITUTIONAL: Vitals:  Vitals:   07/29/16 1044  BP: (!) 133/91  Pulse: 79  Weight: 290 lb (131.5 kg)  Height: 6\' 1"  (1.854 m)     Body mass index is 38.26 kg/m.  No exam data present  Patient is in no distress; well developed, nourished and groomed; neck is supple  CARDIOVASCULAR:  Examination of carotid arteries is normal; no carotid bruits  Regular rate and rhythm, no murmurs  Examination of  peripheral vascular system by observation and palpation is normal  EYES:  Ophthalmoscopic exam of optic discs and posterior segments is normal; no papilledema or hemorrhages  MUSCULOSKELETAL:  Gait, strength, tone, movements noted in Neurologic exam below  NEUROLOGIC: MENTAL STATUS:  No flowsheet data found.  awake, alert, oriented to person, place and time  recent and remote memory intact  normal attention and concentration  SLOW SPEECH; MILD DECR FLUENCY; comprehension intact, naming intact,   fund of knowledge appropriate  CRANIAL NERVE:   2nd - no papilledema on fundoscopic exam  2nd, 3rd, 4th, 6th - pupils equal and reactive to light, visual fields full to confrontation, extraocular muscles intact, no nystagmus  5th - facial sensation symmetric  7th - facial strength --> DECR RIGHT NL FOLD  8th - hearing intact  9th - palate elevates symmetrically, uvula midline  11th - shoulder shrug symmetric  12th - tongue protrusion midline  MOTOR:   normal bulk and tone, full strength in the BUE, BLE  SENSORY:   normal and symmetric to light touch, temperature, vibration  COORDINATION:   finger-nose-finger, fine finger movements SLOW  MILD TREMOR WITH BUE FTN  REFLEXES:   deep tendon reflexes TRACE and symmetric  GAIT/STATION:   UNSTEADY GAIT    DIAGNOSTIC DATA (LABS, IMAGING, TESTING) - I reviewed patient records, labs, notes, testing and imaging myself where available.  Lab Results  Component Value Date   WBC 9.5 07/04/2016   HGB 9.6 (L) 07/04/2016   HCT 29.1 (L) 07/04/2016   MCV 88.4 07/04/2016   PLT 265 07/04/2016      Component Value Date/Time   NA 139 07/05/2016 0402   K 3.7 07/05/2016 0402   CL 107 07/05/2016 0402   CO2 24 07/05/2016 0402   GLUCOSE 83 07/05/2016 0402   BUN 23 (H) 07/05/2016 0402   CREATININE 2.76 (H) 07/05/2016 0402   CALCIUM 8.6 (L) 07/05/2016 0402   PROT 7.7 07/03/2016 1523   ALBUMIN 3.2 (L) 07/05/2016 0402    AST 17 07/03/2016 1523   ALT 11 (L) 07/03/2016 1523   ALKPHOS 44 07/03/2016 1523   BILITOT 0.4 07/03/2016 1523   GFRNONAA 26 (L) 07/05/2016 0402   GFRAA 31 (L) 07/05/2016 0402   Lab Results  Component Value Date   CHOL 153 02/04/2016   HDL 40 (L) 02/04/2016   LDLCALC 78 02/04/2016   TRIG 174 (H) 02/04/2016   CHOLHDL 3.8 02/04/2016   Lab Results  Component Value Date   HGBA1C 7.8 (H) 02/06/2016   Lab Results  Component Value Date   VITAMINB12 449 02/04/2016   Lab Results  Component Value Date   TSH 0.886 02/04/2016    07/02/16 A1c - 5.4  02/20/16 MRI brain [I reviewed images myself and agree with interpretation. -VRP]  1. No new intracranial abnormality. 2. Expected evolution of subacute hematoma in the right  thalamus. Vasogenic edema is decreased and ventriculomegaly on prior study is resolved. 3. No progression of deep cerebral watershed and right superior cerebellar peduncle infarcts. 4. Advanced for age chronic microvascular disease. 5. New and prominent sinusitis on the right. Has the patient had nasal intubation on the right?  02/04/16 MRI brain [I reviewed images myself and agree with interpretation. -VRP]  - Evolving RIGHT thalamic hematoma. Effaced 3rd ventricle with mild acute hydrocephalus. - Bilateral sub centimeter acute watershed territory infarcts. - Moderate to severe white matter changes consistent with interstitial edema and chronic small vessel ischemic disease.  02/04/16 MRA head [I reviewed images myself and agree with interpretation. -VRP]  - No acute vascular process. - Mildly ectatic basilar artery.  02/03/16 CT head [I reviewed images myself and agree with interpretation. -VRP]  - Interval development of intraparenchymal hemorrhage seen in the right thalamus with surrounding white matter edema. Mild dilatation of lateral ventricles is noted. Critical Value/emergent results were called by telephone at the time of interpretation on 02/03/2016  at 2:17 pm to Dr. Lajean Saver , who verbally acknowledged these Results.     ASSESSMENT AND PLAN  46 y.o. year old male here with:   Dx: right thalamic intracerebral hemorrhage due to accelerated hypertension; stable / improving  1. Thalamic hemorrhage ([place])   2. Essential hypertension     PLAN:  RIGHT THALAMIC ICH (hypertensive) --> mid residual gait, balance, cognitive difficulty - continue BP control and lipid control - monitor A1c (now normalized) - continue PT/OT exercises at home - recommend to hold off on aspirin for primary prevention of coronary or ischemic cerebrovascular disease at this time  REMOTE RIGHT BELL'S PALSY (~2012) - mild residual right naso-labial fold weakness  Return if symptoms worsen or fail to improve, for return to PCP.    Penni Bombard, MD 01/07/7251, 66:44 AM Certified in Neurology, Neurophysiology and Neuroimaging  [facility] [address] [place], [place] [address] [phone]

## 2016-07-31 MED FILL — SERTRALINE HCL 50 MG TABLET: 50 | 30 days supply | Qty: 30 | Fill #1

## 2016-07-31 MED FILL — AMLODIPINE BESYLATE 10 MG T: 10 | 30 days supply | Qty: 30 | Fill #1

## 2016-07-31 MED FILL — ATORVASTATIN 10 MG TABLET: 10 | 30 days supply | Qty: 30 | Fill #1

## 2016-07-31 MED FILL — TRIAMTERENE-HCTZ 37.5-25 MG: 37.5-25 | 30 days supply | Qty: 60 | Fill #1

## 2016-07-31 MED FILL — metFORMIN HCL 500 MG TABS: 500 | 30 days supply | Qty: 30 | Fill #1

## 2016-08-07 MED FILL — LABETALOL HCL 300 MG TABLET: 300 | 30 days supply | Qty: 90 | Fill #1

## 2016-09-01 MED FILL — SERTRALINE HCL 50 MG TABLET: 50 | 30 days supply | Qty: 30 | Fill #2

## 2016-09-01 MED FILL — AMLODIPINE BESYLATE 10 MG T: 10 | 30 days supply | Qty: 30 | Fill #2

## 2016-09-01 MED FILL — ATORVASTATIN 10 MG TABLET: 10 | 30 days supply | Qty: 30 | Fill #2

## 2016-09-23 MED FILL — LABETALOL HCL 300 MG TABLET: 300 | 30 days supply | Qty: 90 | Fill #2

## 2016-10-19 MED FILL — LABETALOL HCL 300 MG TABLET: 300 | 30 days supply | Qty: 90 | Fill #3

## 2016-10-19 MED FILL — AMLODIPINE BESYLATE 10 MG T: 10 | 30 days supply | Qty: 30 | Fill #3

## 2016-10-19 MED FILL — ATORVASTATIN 10 MG TABLET: 10 | 30 days supply | Qty: 30 | Fill #3

## 2016-10-19 MED FILL — SERTRALINE HCL 50 MG TABLET: 50 | 30 days supply | Qty: 30 | Fill #3

## 2016-12-01 NOTE — Progress Notes (Unsigned)
[facility] Note  12/02/2016     CHIEF COMPLAINT Patient presents for No chief complaint on file.   HISTORY OF PRESENT ILLNESS: Adam Vincent is a 46 y.o. male who presents to the clinic today for:     Referring physician: Katy Vincent, Adam Guys, MD [address] [place], [name] [address]  HISTORICAL INFORMATION:   Selected notes from the MEDICAL RECORD NUMBER Referred by Dr. Zenia Vincent for concern of DME OU; LEE- 11.26.18 ([facility]) [BCVA OD: 20/70 OS: 20/20] - no ophthalmic care x 10+ years prior to 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 (Ophthalmic Drugs)   No current facility-administered medications for this visit.  (Ophthalmic Drugs)   Current Outpatient Medications (Other)  Medication Sig   amLODipine (NORVASC) 10 MG tablet Take 1 tablet (10 mg total) by mouth daily.   atorvastatin (LIPITOR) 10 MG tablet Take 1 tablet (10 mg total) by mouth daily at 6 PM.   labetalol (NORMODYNE) 300 MG tablet Take 1 tablet (300 mg total) by mouth 3 (three) times daily.   sertraline (ZOLOFT) 50 MG tablet Take 1 tablet (50 mg total) by mouth daily.   triamcinolone cream (KENALOG) 0.1 % Apply topically.   Vitamin D, Ergocalciferol, (DRISDOL) 50000 units CAPS capsule Take 50,000 Units by mouth every 7 (seven) days.   No current facility-administered medications for this visit.  (Other)      REVIEW OF SYSTEMS:    ALLERGIES No Known Allergies  PAST MEDICAL HISTORY Past Medical History:  Diagnosis Date   Diabetes mellitus    Hypertension    Stroke [facility])    Past Surgical History:  Procedure Laterality Date   ABSCESS DRAINAGE     Left leg    FAMILY HISTORY No family history on file.  SOCIAL HISTORY Social History   Tobacco Use   Smoking status: Never Smoker    Smokeless tobacco: Never Used  Substance Use Topics   Alcohol use: No   Drug use: Yes    Types: Marijuana         OPHTHALMIC EXAM:   Not recorded      IMAGING AND PROCEDURES  Imaging and Procedures for 12/01/16           ASSESSMENT/PLAN:    ICD-10-CM   1. Retinal edema H35.81 OCT, Retina - OU - Both Eyes    1.  2.  3.  Ophthalmic Meds Ordered this visit:  No orders of the defined types were placed in this encounter.      No Follow-up on file.  There are no Patient Instructions on file for this visit.   Explained the diagnoses, plan, and follow up with the patient and they expressed understanding.  Patient expressed understanding of the importance of proper follow up care.   Adam Vincent, [name], Ph.D. Diseases & Surgery of the Retina and Vitreous [facility] [place] 12/01/16     Abbreviations: M myopia (nearsighted); A astigmatism; H hyperopia (farsighted); P presbyopia; Mrx spectacle prescription;  CTL contact lenses; OD right eye; OS left eye; OU both eyes  XT exotropia; ET esotropia; PEK punctate epithelial keratitis; PEE punctate epithelial erosions; DES dry eye syndrome; MGD meibomian gland dysfunction; ATs artificial tears; PFAT's preservative free artificial tears; [place] nuclear sclerotic cataract; PSC posterior  subcapsular cataract; ERM epi-retinal membrane; PVD posterior vitreous detachment; RD retinal detachment; DM diabetes mellitus; DR diabetic retinopathy; NPDR non-proliferative diabetic retinopathy; PDR proliferative diabetic retinopathy; CSME clinically significant macular edema; DME diabetic macular edema; dbh dot blot hemorrhages; CWS cotton wool spot; POAG primary open angle glaucoma; C/D cup-to-disc ratio; HVF humphrey visual field; GVF goldmann visual field; OCT optical coherence tomography; IOP intraocular pressure; BRVO Branch retinal vein occlusion; CRVO central retinal vein occlusion; CRAO central retinal artery  occlusion; BRAO branch retinal artery occlusion; RT retinal tear; SB scleral buckle; PPV pars plana vitrectomy; VH Vitreous hemorrhage; PRP panretinal laser photocoagulation; IVK intravitreal kenalog; VMT vitreomacular traction; MH Macular hole;  NVD neovascularization of the disc; NVE neovascularization elsewhere; AREDS age related eye disease study; ARMD age related macular degeneration; POAG primary open angle glaucoma; EBMD epithelial/anterior basement membrane dystrophy; ACIOL anterior chamber intraocular lens; IOL intraocular lens; PCIOL posterior chamber intraocular lens; Phaco/IOL phacoemulsification with intraocular lens placement; [place] photorefractive keratectomy; LASIK laser assisted in situ keratomileusis; HTN hypertension; DM diabetes mellitus; COPD chronic obstructive pulmonary disease

## 2016-12-02 ENCOUNTER — Encounter (INDEPENDENT_AMBULATORY_CARE_PROVIDER_SITE_OTHER): Payer: Self-pay | Admitting: Ophthalmology

## 2016-12-09 NOTE — Progress Notes (Deleted)
[facility] Note  12/14/2016     CHIEF COMPLAINT Patient presents for No chief complaint on file.   HISTORY OF PRESENT ILLNESS: Adam Vincent is a 46 y.o. male who presents to the clinic today for:     Referring physician: [name], [name], MD [address] [place], [place] [address]  HISTORICAL INFORMATION:   Selected notes from the MEDICAL RECORD NUMBER Referred by Dr. [name] for concern of DME OU; LEE- 11.26.18 ([facility]) [BCVA OD: 20/70 OS: 20/20] - no ophthalmic care x 10+ years prior to 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 (Ophthalmic Drugs)   No current facility-administered medications for this visit.  (Ophthalmic Drugs)   Current Outpatient Medications (Other)  Medication Sig   amLODipine (NORVASC) 10 MG tablet Take 1 tablet (10 mg total) by mouth daily.   atorvastatin (LIPITOR) 10 MG tablet Take 1 tablet (10 mg total) by mouth daily at 6 PM.   labetalol (NORMODYNE) 300 MG tablet Take 1 tablet (300 mg total) by mouth 3 (three) times daily.   sertraline (ZOLOFT) 50 MG tablet Take 1 tablet (50 mg total) by mouth daily.   triamcinolone cream (KENALOG) 0.1 % Apply topically.   Vitamin D, Ergocalciferol, (DRISDOL) 50000 units CAPS capsule Take 50,000 Units by mouth every 7 (seven) days.   No current facility-administered medications for this visit.  (Other)      REVIEW OF SYSTEMS:    ALLERGIES No Known Allergies  PAST MEDICAL HISTORY Past Medical History:  Diagnosis Date   Diabetes mellitus    Hypertension    Stroke [facility])    Past Surgical History:  Procedure Laterality Date   ABSCESS DRAINAGE     Left leg    FAMILY HISTORY No family history on file.  SOCIAL HISTORY Social History   Tobacco Use   Smoking status: Never Smoker    Smokeless tobacco: Never Used  Substance Use Topics   Alcohol use: No   Drug use: Yes    Types: Marijuana         OPHTHALMIC EXAM:   Not recorded      IMAGING AND PROCEDURES  Imaging and Procedures for 12/09/16           ASSESSMENT/PLAN:    ICD-10-CM   1. Retinal edema H35.81     1.  2.  3.  Ophthalmic Meds Ordered this visit:  No orders of the defined types were placed in this encounter.      No Follow-up on file.  There are no Patient Instructions on file for this visit.   Explained the diagnoses, plan, and follow up with the patient and they expressed understanding.  Patient expressed understanding of the importance of proper follow up care.   This document serves as a record of services personally performed by [name], MD, PhD. It was created on their behalf by Catha Brow, [place], a certified ophthalmic assistant. The creation of this record is the provider's dictation and/or activities during the visit.  Electronically signed by: Catha Brow, COA  12/09/16 9:53 AM    [name], [name], Ph.D. Diseases & Surgery of the Retina and Vitreous [facility] 12/09/16     Abbreviations: M myopia (nearsighted); A astigmatism; H hyperopia (farsighted); P  presbyopia; Mrx spectacle prescription;  CTL contact lenses; OD right eye; OS left eye; OU both eyes  XT exotropia; ET esotropia; PEK punctate epithelial keratitis; PEE punctate epithelial erosions; DES dry eye syndrome; MGD meibomian gland dysfunction; ATs artificial tears; PFAT's preservative free artificial tears; [place] nuclear sclerotic cataract; PSC posterior subcapsular cataract; ERM epi-retinal membrane; PVD posterior vitreous detachment; RD retinal detachment; DM diabetes mellitus; DR diabetic retinopathy; NPDR non-proliferative diabetic retinopathy; PDR proliferative diabetic retinopathy; CSME clinically significant macular edema; DME diabetic macular  edema; dbh dot blot hemorrhages; CWS cotton wool spot; POAG primary open angle glaucoma; C/D cup-to-disc ratio; HVF humphrey visual field; GVF goldmann visual field; OCT optical coherence tomography; IOP intraocular pressure; BRVO Branch retinal vein occlusion; CRVO central retinal vein occlusion; CRAO central retinal artery occlusion; BRAO branch retinal artery occlusion; RT retinal tear; SB scleral buckle; PPV pars plana vitrectomy; VH Vitreous hemorrhage; PRP panretinal laser photocoagulation; IVK intravitreal kenalog; VMT vitreomacular traction; MH Macular hole;  NVD neovascularization of the disc; NVE neovascularization elsewhere; AREDS age related eye disease study; ARMD age related macular degeneration; POAG primary open angle glaucoma; EBMD epithelial/anterior basement membrane dystrophy; ACIOL anterior chamber intraocular lens; IOL intraocular lens; PCIOL posterior chamber intraocular lens; Phaco/IOL phacoemulsification with intraocular lens placement; [place] photorefractive keratectomy; LASIK laser assisted in situ keratomileusis; HTN hypertension; DM diabetes mellitus; COPD chronic obstructive pulmonary disease

## 2016-12-14 ENCOUNTER — Encounter (INDEPENDENT_AMBULATORY_CARE_PROVIDER_SITE_OTHER): Payer: Self-pay | Admitting: Ophthalmology

## 2016-12-21 ENCOUNTER — Telehealth: Payer: Self-pay | Admitting: Diagnostic Neuroimaging

## 2016-12-21 NOTE — Telephone Encounter (Addendum)
Spoke with daughter, Adam Vincent on [place] and advised her that the patient may be sleepy due to Setraline. She stated he is not taking any of his medications; he refuses.  This RN discussed that he needs to FU with PCP regarding sleeping; Dr Leta Baptist saw him for stroke. This RN explained that sleeping a lot may be from causes unrelated to neurology, particularly with no other symptoms, problems in addition to sleeping a lot. Daughter stated PCP left the practice. This RN advised that there would be another provider in that office to see him. Reviewed A Ervin, PA's last note in Oct and advised Adam Vincent that she had instructed patient to use alarm to take meds on schedule, and pt was to return in 4 weeks for BP check. He did not return. Advised her that controlling his stroke risk factors was of utmost importance, and advised he see PCP again to manage and to discuss his sleepiness. Adam Vincent verbalized understanding, appreciation.Marland Kitchen

## 2016-12-21 NOTE — Telephone Encounter (Signed)
Patient's daughter calling to schedule a soon appointment with Dr. Leta Baptist. She says patient is sleeping all day and that is not his normal.

## 2016-12-22 ENCOUNTER — Encounter (INDEPENDENT_AMBULATORY_CARE_PROVIDER_SITE_OTHER): Payer: Self-pay | Admitting: Ophthalmology

## 2017-01-04 MED FILL — SERTRALINE HCL 50 MG TABLET: 50 | 30 days supply | Qty: 30 | Fill #4

## 2017-01-04 MED FILL — ATORVASTATIN 10 MG TABLET: 10 | 30 days supply | Qty: 30 | Fill #4

## 2017-01-04 MED FILL — AMLODIPINE BESYLATE 10 MG T: 10 | 30 days supply | Qty: 30 | Fill #4

## 2017-01-11 NOTE — Progress Notes (Signed)
[facility] Note  01/12/2017     CHIEF COMPLAINT Patient presents for Diabetic Eye Exam   HISTORY OF PRESENT ILLNESS: Adam Vincent is a 47 y.o. male who presents to the clinic today for:   HPI    Diabetic Eye Exam    Vision is stable.  Associated Symptoms Negative for Flashes, Blind Spot, Photophobia, Scalp Tenderness, Fever, Floaters, Pain, Glare, Jaw Claudication, Weight Loss, Distortion, Redness, Trauma, Shoulder/Hip pain and Fatigue.  Diabetes characteristics include Type 2 and controlled with diet.  This started 2 years ago.  Blood sugar level is controlled.  Last A1C 6.5.  I, the attending physician,  performed the HPI with the patient and updated documentation appropriately.          Comments    Pt presents today on the referral of Dr. [name] regarding a diabetic eye exam, pt states he was dx in 2017 with type 2 diabetes, pts last A1C was 6.5, pt does not check sugar at home, pt is not on medication for DM, states it is controlled though diet, pt denies flashes, floaters, pain and wavy vision, pt denies the use of gtts       Last edited by Bernarda Caffey, MD on 01/12/2017  2:29 PM. (History)    Hx obtained from oldest daughter in room due to cognitive and speech deficit in pt; Reports he was dx with type 2 DM in 2016; Reports hx of hemorrhagic stroke in January of 2018, states BP at time of stroke was greater than 200; Since stroke pt has lost 130 lbs and reports CBG is under better control; Pt states he feels VA decreased "months ago";   Referring physician: Debbra Riding, MD [address] [place], [place] [address]  HISTORICAL INFORMATION:   Selected notes from the MEDICAL RECORD NUMBER Referred by Dr. [name] for concern of DME OU; LEE- 11.26.18 ([facility]) [BCVA OD: 20/70 OS: 20/20] - no ophthalmic care x 10+ years prior to [facility] visit  Ocular Hx- glaucoma suspect OU, cataract OU, DM II with moderate NPDR OU; PMH- NIDDM, HTN, hx  of stroke, marijuana smoker    CURRENT MEDICATIONS: No current outpatient medications on file. (Ophthalmic Drugs)   No current facility-administered medications for this visit.  (Ophthalmic Drugs)   Current Outpatient Medications (Other)  Medication Sig  . amLODipine (NORVASC) 10 MG tablet Take 1 tablet (10 mg total) by mouth daily.  Marland Kitchen atorvastatin (LIPITOR) 10 MG tablet Take 1 tablet (10 mg total) by mouth daily at 6 PM.  . labetalol (NORMODYNE) 300 MG tablet Take 1 tablet (300 mg total) by mouth 3 (three) times daily.  . sertraline (ZOLOFT) 50 MG tablet Take 1 tablet (50 mg total) by mouth daily.  Marland Kitchen triamcinolone cream (KENALOG) 0.1 % Apply topically.  . Vitamin D, Ergocalciferol, (DRISDOL) 50000 units CAPS capsule Take 50,000 Units by mouth every 7 (seven) days.   No current facility-administered medications for this visit.  (Other)      REVIEW OF SYSTEMS: ROS    Positive for: Endocrine, Cardiovascular, Eyes   Negative for: Constitutional, Gastrointestinal, Neurological, Skin, Genitourinary, Musculoskeletal, HENT, Respiratory, Psychiatric, Allergic/Imm, Heme/Lymph   Last edited by Debbrah Alar, COT on 01/12/2017  2:03 PM. (History)       ALLERGIES No Known Allergies  PAST MEDICAL HISTORY Past Medical History:  Diagnosis Date  . Diabetes mellitus   . Hypertension   . Stroke [facility])  Past Surgical History:  Procedure Laterality Date  . ABSCESS DRAINAGE     Left leg    FAMILY HISTORY Family History  Problem Relation Age of Onset  . Cataracts Maternal Grandfather   . Amblyopia Neg Hx   . Blindness Neg Hx   . Diabetes Neg Hx   . Glaucoma Neg Hx   . Macular degeneration Neg Hx   . Retinal detachment Neg Hx   . Strabismus Neg Hx   . Retinitis pigmentosa Neg Hx     SOCIAL HISTORY Social History   Tobacco Use  . Smoking status: Never Smoker  . Smokeless tobacco: Never Used  Substance Use Topics  . Alcohol use: No  . Drug use: Yes    Types: Marijuana          OPHTHALMIC EXAM:  Base Eye Exam    Visual Acuity (Snellen - Linear)      Right Left   Dist [place] 20/200 -1 20/100 -1   Dist ph [place] 20/100 +1 20/40       Tonometry (Tonopen, 2:20 PM)      Right Left   Pressure 19 12       Pupils      Dark Light Shape React APD   Right 4 3 Round Minimal None   Left 4 3 Round Minimal None       Visual Fields (Counting fingers)      Left Right    Full Full       Extraocular Movement      Right Left    Full, Ortho Full, Ortho       Neuro/Psych    Oriented x3:  Yes   Mood/Affect:  Normal       Dilation    Both eyes:  2.5% Phenylephrine @ 2:24 PM        Slit Lamp and Fundus Exam    Slit Lamp Exam      Right Left   Lids/Lashes Dermatochalasis - upper lid Dermatochalasis - upper lid   Conjunctiva/Sclera White and quiet White and quiet   Cornea Endothelial striae horizontally, Arcus, 1+ Punctate epithelial erosions 3+ Punctate epithelial erosions, Arcus   Anterior Chamber Deep and quiet Deep and quiet   Iris round and dilated, No NVI round and dilated, No NVI   Lens 2+ Nuclear sclerosis, 1+ Cortical cataract 2+ Nuclear sclerosis, 1+ Cortical cataract   Vitreous Mild Vitreous syneresis Mild Vitreous syneresis       Fundus Exam      Right Left   Disc sharp, vascular loops and collatereals, + disc hemorrhage at 1000 Normal   C/D Ratio 0.55 0.6   Macula Blunted foveal reflex, Exudates, + edema, MAs, DBH Flat, scattered MAs, Retinal pigment epithelial mottling, mild ERM   Vessels dilated and Tortuous, copper wirinbg of arteriorls, AV crossing changes Dilated and tortuous, copper wiring of arteriols, arteriol attenuation/copper wiring, mild attenuation of venules   Periphery Attached, scattered MAs and DBH Attached, Scattered MAs        Refraction    Manifest Refraction      Sphere Cylinder Axis Dist VA   Right -1.25   20/50-2   Left -2.25 +1.25 087 20/20          IMAGING AND PROCEDURES  Imaging and Procedures for  01/14/17  OCT, Retina - OU - Both Eyes     Right Eye Quality was good. Central Foveal Thickness: 272. Progression has no prior data. Findings include abnormal foveal contour, no  SRF, intraretinal fluid, vitreomacular adhesion  (significant IRF superior fovea, smaller pockets of IRF superior and inferior macula).   Left Eye Quality was good. Central Foveal Thickness: 225. Progression has no prior data. Findings include normal foveal contour, no IRF, no SRF, epiretinal membrane, vitreomacular adhesion .   Notes *Images captured and stored on drive  Diagnosis / Impression:  OD - clinically significant DME OS -  NFP, No IRF/SRF, No DME  Clinical management:  See below  Abbreviations: NFP - Normal foveal profile. CME - cystoid macular edema. PED - pigment epithelial detachment. IRF - intraretinal fluid. SRF - subretinal fluid. EZ - ellipsoid zone. ERM - epiretinal membrane. ORA - outer retinal atrophy. ORT - outer retinal tubulation. SRHM - subretinal hyper-reflective material        Fluorescein Angiography Optos (Transit OD)     Right Eye Progression has no prior data. Early phase findings include delayed filling, microaneurysm. Mid/Late phase findings include microaneurysm, staining, leakage.   Left Eye Progression has no prior data. Early phase findings include microaneurysm. Mid/Late phase findings include microaneurysm.   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 Late leakage superior macula corresponding to macular edema noted on OCT  OS: scattered microaneurysms without NV                ASSESSMENT/PLAN:    ICD-10-CM   1. Central retinal vein occlusion with macular edema of right eye H34.8110   2. 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 Moderate nonproliferative diabetic retinopathy of left eye without macular edema associated with type 2 diabetes mellitus ([place]) I95.1884   4. Hypertensive retinopathy of both eyes H35.033   5. Nuclear sclerosis of both eyes H25.13    1. CRVO w/ CME, OD  - The natural history of retinal vein occlusion and macular edema and treatment options including observation, laser photocoagulation, and intravitreal antiVEGF injection with Avastin and Lucentis and Eylea and intravitreal injection of steroids with triamcinolone and Ozurdex and the complications of these procedures including loss of vision, infection, cataract, glaucoma, and retinal detachment were discussed with patient.  - Specifically discussed findings from [place] / [place] study regarding patient stabilization with anti-VEGF agents and increased potential for visual improvements.  Also discussed need for frequent follow up and potentially multiple injections given the chronic nature of the disease process  - CRVO appears somewhat old with established collateral vessels at the disc and relatively mild intraretinal hemorrhages  - long discussion w/ daughter and pt of RBA including pt's history of stroke and reported increase risk of cardiovascular events associated with anti-VEGF use  - pt and family wish to monitor for now  - F/U 4 weeks  2,3. Moderate nonproliferative retinopathy, both eyes - The incidence, risk factors for progression, natural history and treatment options for diabetic retinopathy were discussed with patient.   - The need for close monitoring of blood glucose, blood pressure, and serum lipids, avoiding cigarette or any type of tobacco, and the need for long term follow up was also discussed with patient. - exam and FA (1.8.19) with scattered microaneurysms and no NV OU  - OCT shows macular edema OD more likely related to CRVO and OS without diabetic macular edema - monitor  4.  Hypertensive retinopathy  OU - discussed importance of tight BP control - monitor   5. 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 - discussed diagnosis and progression - not yet visually significant - monitor for now   Ophthalmic Meds Ordered this visit:  No orders of the defined types were placed in this encounter.      Return in about 4 weeks (around 02/09/2017) for F/U Retinal [place] OD.  There are no Patient Instructions on file for this visit.   Explained the diagnoses, plan, and follow up with the patient and they expressed understanding.  Patient expressed understanding of the importance of proper follow up care.   This document serves as a record of services personally performed by [name], MD, PhD. It was created on their behalf by Catha Brow, [place], a certified ophthalmic assistant. The creation of this record is the provider's dictation and/or activities during the visit.  Electronically signed by: Catha Brow, [place]  01/14/17 9:28 AM    [name], [name], Ph.D. Diseases & Surgery of the Retina and Vitreous [facility] 01/14/17   I have reviewed the above documentation for accuracy and completeness, and I agree with the above. [name], [name], Ph.D. 01/14/17 9:28 AM     Abbreviations: M myopia (nearsighted); A astigmatism; H hyperopia (farsighted); P presbyopia; Mrx spectacle prescription;  CTL contact lenses; OD right eye; OS left eye; OU both eyes  XT exotropia; ET esotropia; PEK punctate epithelial keratitis; PEE punctate epithelial erosions; DES dry eye syndrome; MGD meibomian gland dysfunction; ATs artificial tears; PFAT's preservative free artificial tears; [place] nuclear sclerotic cataract; PSC posterior subcapsular cataract; ERM epi-retinal membrane; PVD posterior vitreous detachment; RD retinal detachment; DM diabetes  mellitus; DR diabetic retinopathy; NPDR non-proliferative diabetic retinopathy; PDR proliferative diabetic retinopathy; CSME clinically significant macular edema; DME diabetic macular edema; dbh dot blot hemorrhages; CWS cotton wool spot; POAG primary open angle glaucoma; C/D cup-to-disc ratio; HVF humphrey visual field; GVF goldmann visual field; OCT optical coherence tomography; IOP intraocular pressure; BRVO Branch retinal vein occlusion; CRVO central retinal vein occlusion; CRAO central retinal artery occlusion; BRAO branch retinal artery occlusion; RT retinal tear; SB scleral buckle; PPV pars plana vitrectomy; VH Vitreous hemorrhage; PRP panretinal laser photocoagulation; IVK intravitreal kenalog; VMT vitreomacular traction; MH Macular hole;  NVD neovascularization of the disc; NVE neovascularization elsewhere; AREDS age related eye disease study; ARMD age related macular degeneration; POAG primary open angle glaucoma; EBMD epithelial/anterior basement membrane dystrophy; ACIOL anterior chamber intraocular lens; IOL intraocular lens; PCIOL posterior chamber intraocular lens; Phaco/IOL phacoemulsification with intraocular lens placement; [place] photorefractive keratectomy; LASIK laser assisted in situ keratomileusis; HTN hypertension; DM diabetes mellitus; COPD chronic obstructive pulmonary disease

## 2017-01-12 ENCOUNTER — Encounter (INDEPENDENT_AMBULATORY_CARE_PROVIDER_SITE_OTHER): Payer: Self-pay | Admitting: Ophthalmology

## 2017-01-12 ENCOUNTER — Ambulatory Visit (INDEPENDENT_AMBULATORY_CARE_PROVIDER_SITE_OTHER): Payer: Medicaid Other | Admitting: Ophthalmology

## 2017-01-12 DIAGNOSIS — H34811 Central retinal vein occlusion, right eye, with macular edema: Secondary | ICD-10-CM | POA: Diagnosis not present

## 2017-01-12 DIAGNOSIS — H2513 Age-related nuclear cataract, bilateral: Secondary | ICD-10-CM

## 2017-01-12 DIAGNOSIS — H35033 Hypertensive retinopathy, bilateral: Secondary | ICD-10-CM

## 2017-01-12 DIAGNOSIS — E113392 Type 2 diabetes mellitus with moderate nonproliferative diabetic retinopathy without macular edema, left eye: Secondary | ICD-10-CM | POA: Diagnosis not present

## 2017-01-12 DIAGNOSIS — E113311 Type 2 diabetes mellitus with moderate nonproliferative diabetic retinopathy with macular edema, right eye: Secondary | ICD-10-CM | POA: Diagnosis not present

## 2017-01-14 ENCOUNTER — Encounter (INDEPENDENT_AMBULATORY_CARE_PROVIDER_SITE_OTHER): Payer: Self-pay | Admitting: Ophthalmology

## 2017-02-11 NOTE — Progress Notes (Signed)
[facility]  02/15/2017     CHIEF COMPLAINT Patient presents for Diabetic Eye Exam   HISTORY OF PRESENT ILLNESS: Adam Vincent is a 47 y.o. male who presents to the clinic today for:   HPI    Diabetic Eye Exam    Vision is stable.  Associated Symptoms Negative for Flashes, Blind Spot, Photophobia, Scalp Tenderness, Fever, Floaters, Pain, Glare, Jaw Claudication, Weight Loss, Distortion, Redness, Trauma, Shoulder/Hip pain and Fatigue.  Diabetes characteristics include Type 2 and controlled with diet.  This started 2 years ago.  Blood sugar level is controlled.  Last A1C 6.5.  I, the attending physician,  performed the HPI with the patient and updated documentation appropriately.          Comments    Patient presents for 4 week follow up of CRVO with CME OD. BS was 150 on 02/12/2017. Patient states vision about the same OU.       Last edited by Roselee Nova D on 02/15/2017  9:32 AM. (History)    Hx obtained from oldest daughter in room due to cognitive and speech deficit in pt; Pt reports OU VA is stable; Pt states he "has been doing well";   Referring physician: Loyola Mast, PA-C [place], [place] [address]  HISTORICAL INFORMATION:   Selected notes from the MEDICAL RECORD NUMBER Referred by Dr. [name] for concern of DME OU; LEE- 11.26.18 (S. Gro[facility]) [BCVA OD: 20/70 OS: 20/20] - no ophthalmic care x 10+ years prior to S. 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 (Ophthalmic Drugs)   No current facility-administered medications for this visit.  (Ophthalmic Drugs)   Current Outpatient Medications (Other)  Medication Sig  . amLODipine (NORVASC) 10 MG tablet Take 1 tablet (10 mg total) by mouth daily.  Marland Kitchen atorvastatin (LIPITOR) 10 MG tablet Take 1 tablet  (10 mg total) by mouth daily at 6 PM.  . labetalol (NORMODYNE) 300 MG tablet Take 1 tablet (300 mg total) by mouth 3 (three) times daily.  . sertraline (ZOLOFT) 50 MG tablet Take 1 tablet (50 mg total) by mouth daily.  Marland Kitchen triamcinolone cream (KENALOG) 0.1 % Apply topically.  . Vitamin D, Ergocalciferol, (DRISDOL) 50000 units CAPS capsule Take 50,000 Units by mouth every 7 (seven) days.   No current facility-administered medications for this visit.  (Other)      REVIEW OF SYSTEMS: ROS    Positive for: Neurological, Endocrine, Cardiovascular, Eyes   Negative for: Constitutional, Gastrointestinal, Skin, Genitourinary, Musculoskeletal, HENT, Respiratory, Psychiatric, Allergic/Imm, Heme/Lymph   Last edited by Roselee Nova D on 02/15/2017  9:40 AM. (History)       ALLERGIES No Known Allergies  PAST MEDICAL HISTORY Past Medical History:  Diagnosis Date  . Diabetes mellitus   . Hypertension   . Stroke [facility])    Past Surgical History:  Procedure Laterality Date  . ABSCESS DRAINAGE     Left leg    FAMILY HISTORY Family History  Problem Relation Age of Onset  . Cataracts Maternal Grandfather   . Amblyopia Neg Hx   . Blindness Neg Hx   . Diabetes Neg Hx   . Glaucoma Neg Hx   . Macular degeneration Neg Hx   . Retinal detachment Neg Hx   . Strabismus Neg Hx   .  Retinitis pigmentosa Neg Hx     SOCIAL HISTORY Social History   Tobacco Use  . Smoking status: Never Smoker  . Smokeless tobacco: Never Used  Substance Use Topics  . Alcohol use: No  . Drug use: Yes    Types: Marijuana         OPHTHALMIC EXAM:  Base Eye Exam    Visual Acuity (Snellen - Linear)      Right Left   Dist [place] 20/100 -2 20/30   Dist ph [place] 20/60 -2 20/25       Tonometry (Tonopen, 9:44 AM)      Right Left   Pressure 18 19       Pupils      Dark Light Shape React APD   Right 4 3 Round Minimal None   Left 4 3 Round Minimal None       Visual Fields      Left Right    Full Full        Extraocular Movement      Right Left    Full, Ortho Full, Ortho       Neuro/Psych    Oriented x3:  Yes   Mood/Affect:  Normal       Dilation    Both eyes:  1.0% Mydriacyl, 2.5% Phenylephrine @ 9:44 AM        Slit Lamp and Fundus Exam    Slit Lamp Exam      Right Left   Lids/Lashes Dermatochalasis - upper lid Dermatochalasis - upper lid   Conjunctiva/Sclera White and quiet White and quiet   Cornea Endothelial striae horizontally, Arcus, 1+ Punctate epithelial erosions 3+ Punctate epithelial erosions, Arcus   Anterior Chamber Deep and quiet Deep and quiet   Iris round and dilated, No NVI round and dilated, No NVI   Lens 2+ Nuclear sclerosis, 1+ Cortical cataract 2+ Nuclear sclerosis, 1+ Cortical cataract   Vitreous Mild Vitreous syneresis Mild Vitreous syneresis       Fundus Exam      Right Left   Disc sharp, vascular loops and collatereals, + disc hemorrhage at 1000 - slightly improved Normal   C/D Ratio 0.55 0.6   Macula Blunted foveal reflex, Exudates, + edema, MAs, DBH, mild interval increase in IRF Flat, scattered MAs, Retinal pigment epithelial mottling, mild ERM   Vessels dilated and Tortuous, copper wirinbg of arteriorls, AV crossing changes Dilated and tortuous, copper wiring of arteriols, arteriol attenuation/copper wiring, mild attenuation of venules   Periphery Attached, scattered MAs and DBH Attached, Scattered MAs          IMAGING AND PROCEDURES  Imaging and Procedures for 02/15/17  OCT, Retina - OU - Both Eyes     Right Eye Quality was good. Central Foveal Thickness: 272. Progression has worsened. Findings include abnormal foveal contour, no SRF, intraretinal fluid, vitreomacular adhesion  (significant IRF superior fovea, smaller pockets of IRF superior and inferior macula).   Left Eye Quality was good. Central Foveal Thickness: 221. Progression has been stable. Findings include normal foveal contour, no IRF, no SRF, epiretinal membrane, vitreomacular  adhesion  (Focal area of inner retinal thinning SN to fovea).   Notes *Images captured and stored on drive  Diagnosis / Impression:  OD - clinically significant DME - mild interval worsening of cystic changes OS -  NFP, No IRF/SRF, No DME  Clinical management:  See below  Abbreviations: NFP - Normal foveal profile. CME - cystoid macular edema. PED - pigment epithelial detachment. IRF -  intraretinal fluid. SRF - subretinal fluid. EZ - ellipsoid zone. ERM - epiretinal membrane. ORA - outer retinal atrophy. ORT - outer retinal tubulation. SRHM - subretinal hyper-reflective material                 ASSESSMENT/PLAN:    ICD-10-CM   1. Central retinal vein occlusion with macular edema of right eye H34.8110 OCT, Retina - OU - Both Eyes  2. 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 Moderate nonproliferative diabetic retinopathy of left eye without macular edema associated with type 2 diabetes mellitus ([place]) Q11.9417   4. Hypertensive retinopathy of both eyes H35.033   5. Nuclear sclerosis of both eyes H25.13    1. CRVO w/ CME, OD  - The natural history of retinal vein occlusion and macular edema and treatment options including observation, laser photocoagulation, and intravitreal antiVEGF injection with Avastin and Lucentis and Eylea and intravitreal injection of steroids with triamcinolone and Ozurdex and the complications of these procedures including loss of vision, infection, cataract, glaucoma, and retinal detachment were discussed with patient.  - Specifically discussed findings from [place] / [place] study regarding patient stabilization with anti-VEGF agents and increased potential for visual improvements.  Also discussed need for frequent follow up and potentially multiple injections given the chronic nature of the disease process  - CRVO appears somewhat old with established collateral  vessels at the disc and relatively mild intraretinal hemorrhages  - interestingly, OCT slightly worse OD, but BCVA improved to 20/60 OD  - long discussion w/ daughter and pt of RBA including pt's history of stroke and reported increase risk of cardiovascular events associated with anti-VEGF use  - pt and family wish to monitor for now  - F/U 4-6 weeks, repeat OCT, FA (Optos transit OD), possible focal laser  2,3. Moderate nonproliferative retinopathy, both eyes - The incidence, risk factors for progression, natural history and treatment options for diabetic retinopathy were discussed with patient.   - The need for close monitoring of blood glucose, blood pressure, and serum lipids, avoiding cigarette or any type of tobacco, and the need for long term follow up was also discussed with patient. - exam and FA (1.8.19) with scattered microaneurysms and no NV OU  - OCT shows macular edema OD more likely related to CRVO and OS without diabetic macular edema - will repeat FA at next visit and consider focal laser OD  4. Hypertensive retinopathy OU - discussed importance of tight BP control - monitor  5. 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 - discussed diagnosis and progression - not yet visually significant - monitor for now   Ophthalmic Meds Ordered this visit:  No orders of the defined types were placed in this encounter.      Return for 4-6 wks; , Dilated Exam, OCT, Fluorescein Angiogram.  There are no Patient Instructions on file for this visit.   Explained the diagnoses, plan, and follow up with the patient and they expressed understanding.  Patient expressed understanding of the importance of proper follow up care.   This document serves as a record of services personally performed by [name], MD, PhD. It was created on their behalf by Catha Brow, [place], a certified  ophthalmic assistant. The creation of this record is the provider's dictation and/or activities during the visit.  Electronically signed by: Ailene Ravel  Erline Levine  02/15/17 11:37 AM   [name], [name], Ph.D. Diseases & Surgery of the Retina and Vitreous [facility] 02/15/17  I have reviewed the above documentation for accuracy and completeness, and I agree with the above. [name], [name], Ph.D. 02/15/17 11:37 AM     Abbreviations: M myopia (nearsighted); A astigmatism; H hyperopia (farsighted); P presbyopia; Mrx spectacle prescription;  CTL contact lenses; OD right eye; OS left eye; OU both eyes  XT exotropia; ET esotropia; PEK punctate epithelial keratitis; PEE punctate epithelial erosions; DES dry eye syndrome; MGD meibomian gland dysfunction; ATs artificial tears; PFAT's preservative free artificial tears; [place] nuclear sclerotic cataract; PSC posterior subcapsular cataract; ERM epi-retinal membrane; PVD posterior vitreous detachment; RD retinal detachment; DM diabetes mellitus; DR diabetic retinopathy; NPDR non-proliferative diabetic retinopathy; PDR proliferative diabetic retinopathy; CSME clinically significant macular edema; DME diabetic macular edema; dbh dot blot hemorrhages; CWS cotton wool spot; POAG primary open angle glaucoma; C/D cup-to-disc ratio; HVF humphrey visual field; GVF goldmann visual field; OCT optical coherence tomography; IOP intraocular pressure; BRVO Branch retinal vein occlusion; CRVO central retinal vein occlusion; CRAO central retinal artery occlusion; BRAO branch retinal artery occlusion; RT retinal tear; SB scleral buckle; PPV pars plana vitrectomy; VH Vitreous hemorrhage; PRP panretinal laser photocoagulation; IVK intravitreal kenalog; VMT vitreomacular traction; MH Macular hole;  NVD neovascularization of the disc; NVE neovascularization elsewhere; AREDS age related eye disease study; ARMD age related macular degeneration; POAG  primary open angle glaucoma; EBMD epithelial/anterior basement membrane dystrophy; ACIOL anterior chamber intraocular lens; IOL intraocular lens; PCIOL posterior chamber intraocular lens; Phaco/IOL phacoemulsification with intraocular lens placement; [place] photorefractive keratectomy; LASIK laser assisted in situ keratomileusis; HTN hypertension; DM diabetes mellitus; COPD chronic obstructive pulmonary disease

## 2017-02-15 ENCOUNTER — Encounter (INDEPENDENT_AMBULATORY_CARE_PROVIDER_SITE_OTHER): Payer: Self-pay | Admitting: Ophthalmology

## 2017-02-15 ENCOUNTER — Ambulatory Visit (INDEPENDENT_AMBULATORY_CARE_PROVIDER_SITE_OTHER): Payer: Medicaid Other | Admitting: Ophthalmology

## 2017-02-15 DIAGNOSIS — H35033 Hypertensive retinopathy, bilateral: Secondary | ICD-10-CM | POA: Diagnosis not present

## 2017-02-15 DIAGNOSIS — H2513 Age-related nuclear cataract, bilateral: Secondary | ICD-10-CM | POA: Diagnosis not present

## 2017-02-15 DIAGNOSIS — H34811 Central retinal vein occlusion, right eye, with macular edema: Secondary | ICD-10-CM | POA: Diagnosis not present

## 2017-02-15 DIAGNOSIS — E113392 Type 2 diabetes mellitus with moderate nonproliferative diabetic retinopathy without macular edema, left eye: Secondary | ICD-10-CM | POA: Diagnosis not present

## 2017-02-15 DIAGNOSIS — E113311 Type 2 diabetes mellitus with moderate nonproliferative diabetic retinopathy with macular edema, right eye: Secondary | ICD-10-CM | POA: Diagnosis not present

## 2017-02-18 MED FILL — AMLODIPINE BESYLATE 10 MG T: 10 | 30 days supply | Qty: 30 | Fill #5

## 2017-02-18 MED FILL — LABETALOL HCL 300 MG TABLET: 300 | 30 days supply | Qty: 90 | Fill #4

## 2017-02-18 MED FILL — SERTRALINE HCL 50 MG TABLET: 50 | 30 days supply | Qty: 30 | Fill #5

## 2017-02-18 MED FILL — ATORVASTATIN 10 MG TABLET: 10 | 30 days supply | Qty: 30 | Fill #5

## 2017-03-12 NOTE — Progress Notes (Signed)
[facility] Note  03/15/2017     CHIEF COMPLAINT Patient presents for Retina Follow Up   HISTORY OF PRESENT ILLNESS: Adam Vincent is a 47 y.o. male who presents to the clinic today for:   HPI    Retina Follow Up    Patient presents with  Other.  In right eye.  This started 1 month ago.  Severity is mild.  Since onset it is stable.  I, the attending physician,  performed the HPI with the patient and updated documentation appropriately.          Comments    F/U CRVO w/CME. Patient states his vision is about the same, denies wavy vision,ocular pain and flashes. Pt does not monitor Bs . Denies vit's/gtt's        Last edited by Bernarda Caffey, MD on 03/15/2017 11:18 AM. (History)      Referring physician: Loyola Mast, PA-C [place], [place] [address]  HISTORICAL INFORMATION:   Selected notes from the MEDICAL RECORD NUMBER Referred by Dr. [name] for concern of DME OU; LEE- 11.26.18 ([facility]) [BCVA OD: 20/70 OS: 20/20] - no ophthalmic care x 10+ years prior to S. 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 (Ophthalmic Drugs)   No current facility-administered medications for this visit.  (Ophthalmic Drugs)   Current Outpatient Medications (Other)  Medication Sig  . amLODipine (NORVASC) 10 MG tablet Take 1 tablet (10 mg total) by mouth daily.  Marland Kitchen atorvastatin (LIPITOR) 10 MG tablet Take 1 tablet (10 mg total) by mouth daily at 6 PM.  . labetalol (NORMODYNE) 300 MG tablet Take 1 tablet (300 mg total) by mouth 3 (three) times daily.  . sertraline (ZOLOFT) 50 MG tablet Take 1 tablet (50 mg total) by mouth daily.  Marland Kitchen triamcinolone cream (KENALOG) 0.1 % Apply topically.  . Vitamin D, Ergocalciferol, (DRISDOL) 50000 units CAPS capsule Take 50,000 Units by mouth  every 7 (seven) days.   No current facility-administered medications for this visit.  (Other)      REVIEW OF SYSTEMS: ROS    Positive for: Neurological, Musculoskeletal, Endocrine, Eyes, Psychiatric   Negative for: Constitutional, Gastrointestinal, Skin, Genitourinary, HENT, Cardiovascular, Respiratory, Allergic/Imm, Heme/Lymph   Last edited by Zenovia Jordan, LPN on 0/07/6224  3:33 AM. (History)       ALLERGIES No Known Allergies  PAST MEDICAL HISTORY Past Medical History:  Diagnosis Date  . Diabetes mellitus   . Hypertension   . Stroke [facility])    Past Surgical History:  Procedure Laterality Date  . ABSCESS DRAINAGE     Left leg    FAMILY HISTORY Family History  Problem Relation Age of Onset  . Cataracts Maternal Grandfather   . Amblyopia Neg Hx   . Blindness Neg Hx   . Diabetes Neg Hx   . Glaucoma Neg Hx   . Macular degeneration Neg Hx   . Retinal detachment Neg Hx   . Strabismus Neg Hx   . Retinitis pigmentosa Neg Hx     SOCIAL HISTORY Social History   Tobacco Use  . Smoking status: Never Smoker  . Smokeless tobacco: Never Used  Substance Use Topics  . Alcohol use: No  . Drug use: Yes    Types: Marijuana  OPHTHALMIC EXAM:  Base Eye Exam    Visual Acuity (Snellen - Linear)      Right Left   Dist [place] 20/150 +2 20/40 -1   Dist ph [place] 20/80 +1 20/25 -2       Tonometry (Tonopen, 10:25 AM)      Right Left   Pressure 11 15  Done by MCF - difficult due to squeezing OU       Pupils      Dark Light Shape React APD   Right 3 2.5 Round Minimal None   Left 3 2.5 Round Minimal None       Visual Fields (Counting fingers)      Left Right    Full Full       Extraocular Movement      Right Left    Full, Ortho Full, Ortho       Neuro/Psych    Oriented x3:  Yes   Mood/Affect:  Normal       Dilation    Both eyes:  1.0% Mydriacyl, 2.5% Phenylephrine @ 10:04 AM        Slit Lamp and Fundus Exam    Slit Lamp Exam      Right Left    Lids/Lashes Dermatochalasis - upper lid Dermatochalasis - upper lid   Conjunctiva/Sclera White and quiet White and quiet   Cornea Endothelial striae horizontally, Arcus, 1+ inferior Punctate epithelial erosions 3+ Punctate epithelial erosions, Arcus   Anterior Chamber Deep and quiet Deep and quiet   Iris round and dilated, No NVI round and dilated, No NVI   Lens 2+ Nuclear sclerosis, 1+ Cortical cataract 2+ Nuclear sclerosis, 1+ Cortical cataract   Vitreous Mild Vitreous syneresis Mild Vitreous syneresis       Fundus Exam      Right Left   Disc sharp, vascular loops and collatereals, + disc hemorrhage at 1000 - resolved Normal   C/D Ratio 0.55 0.6   Macula Blunted foveal reflex, Exudates, + edema, MAs, DBH, persistent IRF Blunted foveal reflex, Flat, scattered MAs, Retinal pigment epithelial mottling, mild ERM   Vessels dilated and Tortuous, copper wiring of arteriorls, AV crossing changes Dilated and tortuous, copper wiring of arteriols, arteriol attenuation/copper wiring, mild attenuation of venules   Periphery Attached, scattered MAs and DBH Attached, Scattered MAs          IMAGING AND PROCEDURES  Imaging and Procedures for 03/15/17  OCT, Retina - OU - Both Eyes     Right Eye Quality was good. Central Foveal Thickness: 267. Progression has improved. Findings include abnormal foveal contour, no SRF, intraretinal fluid, vitreomacular adhesion  (significant IRF superior fovea, smaller pockets of IRF superior and inferior macula - all improved).   Left Eye Quality was good. Central Foveal Thickness: 222. Progression has been stable. Findings include normal foveal contour, no IRF, no SRF, epiretinal membrane, vitreomacular adhesion  (Focal area of inner retinal thinning SN to fovea).   Notes *Images captured and stored on drive  Diagnosis / Impression:  OD - clinically significant DME - slightly improved from prior OS -  NFP, No IRF/SRF, No DME  Clinical management:  See  below  Abbreviations: NFP - Normal foveal profile. CME - cystoid macular edema. PED - pigment epithelial detachment. IRF - intraretinal fluid. SRF - subretinal fluid. EZ - ellipsoid zone. ERM - epiretinal membrane. ORA - outer retinal atrophy. ORT - outer retinal tubulation. SRHM - subretinal hyper-reflective material  ASSESSMENT/PLAN:    ICD-10-CM   1. Central retinal vein occlusion with macular edema of right eye H34.8110 OCT, Retina - OU - Both Eyes  2. 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 Moderate nonproliferative diabetic retinopathy of left eye without macular edema associated with type 2 diabetes mellitus ([place]) P82.4235   4. Hypertensive retinopathy of both eyes H35.033   5. Nuclear sclerosis of both eyes H25.13    1. CRVO w/ CME, OD  - The natural history of retinal vein occlusion and macular edema and treatment options including observation, laser photocoagulation, and intravitreal antiVEGF injection with Avastin and Lucentis and Eylea and intravitreal injection of steroids with triamcinolone and Ozurdex and the complications of these procedures including loss of vision, infection, cataract, glaucoma, and retinal detachment were discussed with patient.  - Specifically discussed findings from [place] / [place] study regarding patient stabilization with anti-VEGF agents and increased potential for visual improvements.  Also discussed need for frequent follow up and potentially multiple injections given the chronic nature of the disease process  - CRVO appears somewhat old with established collateral vessels at the disc and relatively mild intraretinal hemorrhages  - OCT and BCVA remain relatively stable OD  - long discussion w/ daughter and pt of RBA including pt's history of stroke and reported increase risk of cardiovascular events  associated with anti-VEGF use  - pt and family wish to monitor for now  - F/U 2 mos, repeat OCT, FA (Optos transit OD), possible focal laser  2,3. Moderate nonproliferative retinopathy, both eyes - The incidence, risk factors for progression, natural history and treatment options for diabetic retinopathy were discussed with patient.   - The need for close monitoring of blood glucose, blood pressure, and serum lipids, avoiding cigarette or any type of tobacco, and the need for long term follow up was also discussed with patient. - exam and FA (1.8.19) with scattered microaneurysms and no NV OU  - OCT shows macular edema OD more likely related to CRVO and OS without diabetic macular edema - discussed possibility of focal laser to try to help central edema OD - will repeat FA at next visit and consider focal laser OD -- 2 mos  4. Hypertensive retinopathy OU - discussed importance of tight BP control - monitor  5. 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 - discussed diagnosis and progression - not yet visually significant - monitor for now   Ophthalmic Meds Ordered this visit:  No orders of the defined types were placed in this encounter.      Return in about 2 months (around 05/15/2017) for F/U CRVO w/ CME OD.  There are no Patient Instructions on file for this visit.   Explained the diagnoses, plan, and follow up with the patient and they expressed understanding.  Patient expressed understanding of the importance of proper follow up care.   This document serves as a record of services personally performed by [name], MD, PhD. It was created on their behalf by Catha Brow, [place], a certified ophthalmic assistant. The creation of this record is the provider's dictation and/or activities during the visit.  Electronically signed by: Catha Brow, COA  03/15/17 1:10  PM   [name], [name], Ph.D. Diseases & Surgery of the Retina and Vitreous [facility] 03/15/17  I have reviewed the above documentation for accuracy and completeness, and I agree with the above. [name], [name], Ph.D. 03/15/17 1:12 PM     Abbreviations: M myopia (nearsighted); A astigmatism; H hyperopia (farsighted); P presbyopia; Mrx spectacle prescription;  CTL contact lenses; OD right eye; OS left eye; OU both eyes  XT exotropia; ET esotropia; PEK punctate epithelial keratitis; PEE punctate epithelial erosions; DES dry eye syndrome; MGD meibomian gland dysfunction; ATs artificial tears; PFAT's preservative free artificial tears; [place] nuclear sclerotic cataract; PSC posterior subcapsular cataract; ERM epi-retinal membrane; PVD posterior vitreous detachment; RD retinal detachment; DM diabetes mellitus; DR diabetic retinopathy; NPDR non-proliferative diabetic retinopathy; PDR proliferative diabetic retinopathy; CSME clinically significant macular edema; DME diabetic macular edema; dbh dot blot hemorrhages; CWS cotton wool spot; POAG primary open angle glaucoma; C/D cup-to-disc ratio; HVF humphrey visual field; GVF goldmann visual field; OCT optical coherence tomography; IOP intraocular pressure; BRVO Branch retinal vein occlusion; CRVO central retinal vein occlusion; CRAO central retinal artery occlusion; BRAO branch retinal artery occlusion; RT retinal tear; SB scleral buckle; PPV pars plana vitrectomy; VH Vitreous hemorrhage; PRP panretinal laser photocoagulation; IVK intravitreal kenalog; VMT vitreomacular traction; MH Macular hole;  NVD neovascularization of the disc; NVE neovascularization elsewhere; AREDS age related eye disease study; ARMD age related macular degeneration; POAG primary open angle glaucoma; EBMD epithelial/anterior basement membrane dystrophy; ACIOL anterior chamber intraocular lens; IOL intraocular lens; PCIOL posterior chamber intraocular lens;  Phaco/IOL phacoemulsification with intraocular lens placement; [place] photorefractive keratectomy; LASIK laser assisted in situ keratomileusis; HTN hypertension; DM diabetes mellitus; COPD chronic obstructive pulmonary disease

## 2017-03-15 ENCOUNTER — Encounter (INDEPENDENT_AMBULATORY_CARE_PROVIDER_SITE_OTHER): Payer: Self-pay | Admitting: Ophthalmology

## 2017-03-15 ENCOUNTER — Ambulatory Visit (INDEPENDENT_AMBULATORY_CARE_PROVIDER_SITE_OTHER): Payer: Medicaid Other | Admitting: Ophthalmology

## 2017-03-15 DIAGNOSIS — H34811 Central retinal vein occlusion, right eye, with macular edema: Secondary | ICD-10-CM

## 2017-03-15 DIAGNOSIS — E113392 Type 2 diabetes mellitus with moderate nonproliferative diabetic retinopathy without macular edema, left eye: Secondary | ICD-10-CM | POA: Diagnosis not present

## 2017-03-15 DIAGNOSIS — H35033 Hypertensive retinopathy, bilateral: Secondary | ICD-10-CM | POA: Diagnosis not present

## 2017-03-15 DIAGNOSIS — H2513 Age-related nuclear cataract, bilateral: Secondary | ICD-10-CM | POA: Diagnosis not present

## 2017-03-15 DIAGNOSIS — E113311 Type 2 diabetes mellitus with moderate nonproliferative diabetic retinopathy with macular edema, right eye: Secondary | ICD-10-CM

## 2017-05-14 ENCOUNTER — Telehealth: Payer: Self-pay | Admitting: Neurology

## 2017-05-14 MED FILL — LABETALOL HCL 300 MG TABLET: 300 | 30 days supply | Qty: 90 | Fill #0

## 2017-05-14 MED FILL — ATORVASTATIN 10 MG TABLET: 10 | 30 days supply | Qty: 30 | Fill #0

## 2017-05-14 MED FILL — AMLODIPINE BESYLATE 10 MG T: 10 | 30 days supply | Qty: 30 | Fill #0

## 2017-05-14 MED FILL — SERTRALINE HCL 50 MG TABLET: 50 | 30 days supply | Qty: 30 | Fill #0

## 2017-05-14 NOTE — Telephone Encounter (Signed)
Was paged by Adam Oman PA at his primary care physician office, [place]. Blood pressure and diabetes have been well controlled. Brought in by daughter who said he was "off". In the pcp office he was at baseline. Neuro exam was non focal. Blood pressure was normal. Basic labs were taken, he was not able to provide urine and they gave him a cup to bring urine back later. Patient had already left the clinic.  I Recommended if there was another incident where he seemed "off" again, family should go to ED where labs and imaging can be ordered stat. thanks

## 2017-05-20 NOTE — Telephone Encounter (Signed)
Spoke to pts daughter and relayed that due to being off, starring, flat affect, even though responds to  Commands, his Bp elevated, he did not take his meds, and sometimes fights taking he meds.  I recommended that they take to ED for evaluation. (they can do labs, imaging) to r/o strokes, sz, etc.  She verbalized understanding.

## 2017-05-20 NOTE — Telephone Encounter (Signed)
Pts daughter Jonelle Sidle called scheduling an appt with Dr. Leta Baptist for 6/11 due to the pt "being off" stating it had been very difficult to explain. FYI

## 2017-05-21 ENCOUNTER — Encounter (INDEPENDENT_AMBULATORY_CARE_PROVIDER_SITE_OTHER): Payer: Medicaid Other | Admitting: Ophthalmology

## 2017-05-25 ENCOUNTER — Encounter (HOSPITAL_COMMUNITY): Payer: Self-pay

## 2017-05-25 ENCOUNTER — Emergency Department (HOSPITAL_COMMUNITY)
Admission: EM | Admit: 2017-05-25 | Discharge: 2017-05-26 | Disposition: A | Discharge status: Home / Self Care | Payer: Medicaid Other | Attending: Emergency Medicine | Admitting: Emergency Medicine

## 2017-05-25 ENCOUNTER — Emergency Department (HOSPITAL_COMMUNITY): Payer: Medicaid Other

## 2017-05-25 ENCOUNTER — Other Ambulatory Visit: Payer: Self-pay

## 2017-05-25 DIAGNOSIS — E119 Type 2 diabetes mellitus without complications: Secondary | ICD-10-CM | POA: Insufficient documentation

## 2017-05-25 DIAGNOSIS — I10 Essential (primary) hypertension: Secondary | ICD-10-CM | POA: Diagnosis not present

## 2017-05-25 DIAGNOSIS — W19XXXA Unspecified fall, initial encounter: Secondary | ICD-10-CM

## 2017-05-25 DIAGNOSIS — Z79899 Other long term (current) drug therapy: Secondary | ICD-10-CM | POA: Insufficient documentation

## 2017-05-25 DIAGNOSIS — R479 Unspecified speech disturbances: Secondary | ICD-10-CM | POA: Diagnosis present

## 2017-05-25 DIAGNOSIS — Z043 Encounter for examination and observation following other accident: Secondary | ICD-10-CM | POA: Diagnosis not present

## 2017-05-25 LAB — CBC WITH DIFFERENTIAL/PLATELET
ABS IMMATURE GRANULOCYTES: 0 10*3/uL (ref 0.0–0.1)
BASOS ABS: 0 10*3/uL (ref 0.0–0.1)
Basophils Relative: 1 %
Eosinophils Absolute: 0.1 10*3/uL (ref 0.0–0.7)
Eosinophils Relative: 2 %
HCT: 39.2 % (ref 39.0–52.0)
HEMOGLOBIN: 12.7 g/dL — AB (ref 13.0–17.0)
Immature Granulocytes: 0 %
LYMPHS ABS: 1.4 10*3/uL (ref 0.7–4.0)
Lymphocytes Relative: 20 %
MCH: 29.3 pg (ref 26.0–34.0)
MCHC: 32.4 g/dL (ref 30.0–36.0)
MCV: 90.5 fL (ref 78.0–100.0)
MONOS PCT: 7 %
Monocytes Absolute: 0.5 10*3/uL (ref 0.1–1.0)
NEUTROS PCT: 70 %
Neutro Abs: 4.9 10*3/uL (ref 1.7–7.7)
PLATELETS: 212 10*3/uL (ref 150–400)
RBC: 4.33 MIL/uL (ref 4.22–5.81)
RDW: 12.8 % (ref 11.5–15.5)
WBC: 7 10*3/uL (ref 4.0–10.5)

## 2017-05-25 LAB — CK: Total CK: 167 U/L (ref 49–397)

## 2017-05-25 LAB — COMPREHENSIVE METABOLIC PANEL
ALT: 10 U/L — ABNORMAL LOW (ref 17–63)
ANION GAP: 11 (ref 5–15)
AST: 13 U/L — ABNORMAL LOW (ref 15–41)
Albumin: 3.8 g/dL (ref 3.5–5.0)
Alkaline Phosphatase: 48 U/L (ref 38–126)
BUN: 23 mg/dL — ABNORMAL HIGH (ref 6–20)
CHLORIDE: 103 mmol/L (ref 101–111)
CO2: 23 mmol/L (ref 22–32)
CREATININE: 2.71 mg/dL — AB (ref 0.61–1.24)
Calcium: 9.1 mg/dL (ref 8.9–10.3)
GFR, EST AFRICAN AMERICAN: 31 mL/min — AB (ref >60)
GFR, EST NON AFRICAN AMERICAN: 27 mL/min — AB (ref >60)
Glucose, Bld: 105 mg/dL — ABNORMAL HIGH (ref 65–99)
POTASSIUM: 4.2 mmol/L (ref 3.5–5.1)
SODIUM: 137 mmol/L (ref 135–145)
Total Bilirubin: 1 mg/dL (ref 0.3–1.2)
Total Protein: 7.1 g/dL (ref 6.5–8.1)

## 2017-05-25 LAB — TROPONIN I

## 2017-05-25 MED ORDER — LABETALOL HCL 100 MG PO TABS: 100.0000 mg | ORAL_TABLET | Freq: Three times a day (TID) | ORAL | 0 refills | Status: DC

## 2017-05-25 NOTE — Discharge Instructions (Addendum)
As we discussed, some of your symptoms could be due to dropping her blood pressure after taking her medications.  I am going to give you a prescription for labetalol 100 mg.  Instead of your 300 mg once a day, I would recommend taking the 100 mg 3 times a day.  This can be decreased to twice a day or even once a day based on discussion with her primary care doctor.  Do not continue taking the 300 mg dose, as this could be contributing to your falls.

## 2017-05-25 NOTE — ED Notes (Signed)
Patient transported to X-ray 

## 2017-05-25 NOTE — ED Triage Notes (Signed)
Pt arrives to ED from home with complaints of unwitnessed fall while in the shower since this afternoon. EMS reports pt has hx of fall, is alert and oriented, ambulatory with walker. Minimal verbal responses baseline, unknown if pt lost consciousness or hit his head with fall. No obvious physical injuries, pt did deficate on himself after fall, cleaned upon EMS arrival. Pt placed in position of comfort with bed locked and lowered, call bell in reach.

## 2017-05-26 NOTE — ED Notes (Addendum)
Discharge instructions and prescriptions discussed with Pt. Pt verbalized understanding. Pt stable and ambulatory.   

## 2017-05-26 NOTE — ED Provider Notes (Signed)
[facility] EMERGENCY DEPARTMENT Provider Note   CSN: [id number] Arrival date & time: 05/25/17  3419     History   Chief Complaint Chief Complaint  Patient presents with  . Fall    HPI JERET GOYER is a 47 y.o. male.  HPI  47 year old male with history of previous stroke, hypertension, diabetes, here with fall.  History is somewhat limited due to patient's baseline cognitive difficulty and speech deficits secondary to stroke.  However, per report from EMS, the patient was in the shower when he reportedly slipped and fell.  The fall was not witnessed.  The patient was found down.  Patient is a history of recurrent falls.  He currently is without complaints.  Specifically, he denies any headache, neck pain, chest pain, back pain, numbness, weakness, or other complaints.  He states he was well prior to the fall.  He believes that he got somewhat lightheaded and then fell.  He does not know whether he lost consciousness.  Level 5 caveat invoked as remainder of history, ROS, and physical exam limited due to patient's stroke, speech deficits 2/2 CVA.   Past Medical History:  Diagnosis Date  . Diabetes mellitus   . Hypertension   . Stroke [facility])     Patient Active Problem List   Diagnosis Date Noted  . Acute renal failure ([place]) 07/03/2016  . Normocytic anemia 07/03/2016  . 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 Depression 02/26/2016  . OSA (obstructive sleep apnea) 02/26/2016  . Hyperlipidemia 02/26/2016  . Hydrocephalus   . Cerebrovascular accident (CVA) ([place]) - B watershed d/t strict BP control   . Morbid obesity ([place])   . Marijuana abuse   . Benign essential HTN   . Diabetes mellitus type 2 in obese ([place])   . Fever   . Acute blood loss anemia   . Dysphagia   . Stage 3 chronic kidney disease ([place])   . Lethargy   . Altered mental state   . Uncontrolled hypertension     Past Surgical History:  Procedure  Laterality Date  . ABSCESS DRAINAGE     Left leg        Home Medications    Prior to Admission medications   Medication Sig Start Date End Date Taking? Authorizing Provider  amLODipine (NORVASC) 10 MG tablet Take 1 tablet (10 mg total) by mouth daily. 02/27/16  Yes Donzetta Starch, NP  atorvastatin (LIPITOR) 10 MG tablet Take 1 tablet (10 mg total) by mouth daily at 6 PM. Patient taking differently: Take 10 mg by mouth daily.  02/26/16  Yes Donzetta Starch, NP  Cholecalciferol (VITAMIN D3) 2000 units TABS Take 2,000 Units by mouth daily.    Yes [provider]  sertraline (ZOLOFT) 50 MG tablet Take 1 tablet (50 mg total) by mouth daily. 02/27/16  Yes Donzetta Starch, NP  triamcinolone cream (KENALOG) 0.1 % Apply 1 application topically daily as needed (to affected areas for itching).    Yes [provider]  labetalol (NORMODYNE) 100 MG tablet Take 1 tablet (100 mg total) by mouth 3 (three) times daily for 14 days. 05/25/17 06/08/17  Duffy Bruce, MD    Family History Family History  Problem Relation Age of Onset  . Cataracts Maternal Grandfather   . Amblyopia Neg Hx   . Blindness Neg Hx   . Diabetes Neg Hx   . Glaucoma Neg Hx   . Macular degeneration Neg Hx   .  Retinal detachment Neg Hx   . Strabismus Neg Hx   . Retinitis pigmentosa Neg Hx     Social History Social History   Tobacco Use  . Smoking status: Never Smoker  . Smokeless tobacco: Never Used  Substance Use Topics  . Alcohol use: No  . Drug use: Yes    Types: Marijuana     Allergies   Patient has no known allergies.   Review of Systems Review of Systems  Constitutional: Negative for chills, fatigue and fever.  HENT: Negative for congestion and rhinorrhea.   Eyes: Negative for visual disturbance.  Respiratory: Negative for cough, shortness of breath and wheezing.   Cardiovascular: Negative for chest pain and leg swelling.  Gastrointestinal: Negative for abdominal pain, diarrhea, nausea and  vomiting.  Genitourinary: Negative for dysuria and flank pain.  Musculoskeletal: Negative for neck pain and neck stiffness.  Skin: Negative for rash and wound.  Allergic/Immunologic: Negative for immunocompromised state.  Neurological: Negative for syncope, weakness and headaches.  All other systems reviewed and are negative.    Physical Exam Updated Vital Signs BP (!) 152/97   Pulse 60   Temp 98.1 F (36.7 C) (Oral)   Resp 19   Ht 6\' 1"  (1.854 m)   Wt 131.5 kg (290 lb)   SpO2 100%   BMI 38.26 kg/m   Physical Exam  Constitutional: He is oriented to person, place, and time. He appears well-developed and well-nourished. No distress.  HENT:  Head: Normocephalic and atraumatic.  Eyes: Conjunctivae are normal.  Neck: Neck supple.  Cardiovascular: Normal rate, regular rhythm and normal heart sounds. Exam reveals no friction rub.  No murmur heard. Pulmonary/Chest: Effort normal and breath sounds normal. No respiratory distress. He has no wheezes. He has no rales.  Abdominal: He exhibits no distension.  Musculoskeletal: He exhibits no edema.  Neurological: He is alert and oriented to person, place, and time. He exhibits normal muscle tone.  Skin: Skin is warm. Capillary refill takes less than 2 seconds.  Psychiatric: He has a normal mood and affect.  Nursing note and vitals reviewed.   Neurological Exam:  Mental Status: Alert and oriented to person, place, and time. Attention and concentration normal. Speech delayed, answers in one word sentences. Memory seems slightly impaired. Cranial Nerves: Visual fields grossly intact. EOMI and PERRLA. No nystagmus noted. Facial sensation intact at forehead, maxillary cheek, and chin/mandible bilaterally. No facial asymmetry or weakness. Hearing grossly normal. Uvula is midline, and palate elevates symmetrically. Normal SCM and trapezius strength. Tongue midline without fasciculations. Motor: Muscle strength 5/5 in proximal and distal UE and  LE bilaterally. No pronator drift. Muscle tone normal. Reflexes: 2+ and symmetrical in all four extremities.  Sensation: Intact to light touch in upper and lower extremities distally bilaterally.  Gait: Normal without ataxia. Coordination: Normal FTN bilaterally.     ED Treatments / Results  Labs (all labs ordered are listed, but only abnormal results are displayed) Labs Reviewed  CBC WITH DIFFERENTIAL/PLATELET - Abnormal; Notable for the following components:      Result Value   Hemoglobin 12.7 (*)    All other components within normal limits  COMPREHENSIVE METABOLIC PANEL - Abnormal; Notable for the following components:   Glucose, Bld 105 (*)    BUN 23 (*)    Creatinine, Ser 2.71 (*)    AST 13 (*)    ALT 10 (*)    GFR calc non Af Amer 27 (*)    GFR calc Af Amer 31 (*)  All other components within normal limits  TROPONIN I  CK    EKG EKG Interpretation  Date/Time:  Tuesday May 25 2017 19:11:29 EDT Ventricular Rate:  50 PR Interval:    QRS Duration: 102 QT Interval:  460 QTC Calculation: 420 R Axis:   81 Text Interpretation:  Sinus rhythm Borderline prolonged PR interval Anterior infarct, old No significant change since last tracing Confirmed by Duffy Bruce [id number]) on 05/26/2017 12:32:38 AM   Radiology Dg Chest 2 View  Result Date: 05/25/2017 CLINICAL DATA:  Syncope and hypertension EXAM: CHEST - 2 VIEW COMPARISON:  February 15, 2016 FINDINGS: There is no edema or consolidation. The heart size and pulmonary vascularity are normal. No adenopathy. There is degenerative change in the thoracic spine. IMPRESSION: No edema or consolidation. Electronically Signed   By: Lowella Grip III [name]   On: 05/25/2017 19:35   Ct Head Wo Contrast  Result Date: 05/25/2017 CLINICAL DATA:  Altered LOC, unwitnessed fall EXAM: CT HEAD WITHOUT CONTRAST TECHNIQUE: Contiguous axial images were obtained from the base of the skull through the vertex without intravenous contrast.  COMPARISON:  MRI 02/20/2016, CT brain 02/11/2016 FINDINGS: Brain: No acute territorial infarction, hemorrhage or intracranial mass. Atrophy with small vessel ischemic changes of the white matter. Encephalomalacia in the right thalamus at the site of prior hemorrhage. Old lacunar infarct in the left thalamus. Stable ventricle size. Vascular: No hyperdense vessels.  No unexpected calcification Skull: No fracture Sinuses/Orbits: Mucosal thickening in the ethmoid sinuses. No acute orbital abnormality Other: None IMPRESSION: 1. No CT evidence for acute intracranial abnormality. 2. Atrophy and small vessel ischemic changes of the white matter. Encephalomalacia at the right thalamus in the region of previously noted hemorrhage. Electronically Signed   By: Donavan Foil [name]   On: 05/25/2017 20:55    Procedures Procedures (including critical care time)  Medications Ordered in ED Medications - No data to display   Initial Impression / Assessment and Plan / ED Course  I have reviewed the triage vital signs and the nursing notes.  Pertinent labs & imaging results that were available during my care of the patient were reviewed by me and considered in my medical decision making (see chart for details).     47 year old male here with fall in the shower.  From a fall perspective, CT and other imaging is negative for acute abnormality.  He is hemodynamically stable.  He is at his neurological baseline.  EKG at baseline.  Patient was monitored in the ED.  Given that he potentially had a syncopal episode, screening lab work obtained.  He has a negative troponin and no ischemia on EKG.  No arrhythmia on telemetry.  Hemoglobin is at baseline.  Lab work is otherwise reassuring.  He has baseline CKD that is not acutely worse.  When family arrived, had a further discussion with him.  This reportedly is a recurrent issue when he takes his dose of labetalol and he gets slightly lightheaded when he stands up.  I discussed  this with him in detail.  Given that he has not had his labetalol since his been here, he is not orthostatic currently, but I suspect this is contributing to his falls.  Will have him decrease his dose and have him call his PCP.  Return precautions were given.  Final Clinical Impressions(s) / ED Diagnoses   Final diagnoses:  Fall, initial encounter    ED Discharge Orders        Ordered    labetalol (  NORMODYNE) 100 MG tablet  3 times daily     05/25/17 2335       Duffy Bruce, MD 05/26/17 [phone]

## 2017-05-27 MED FILL — LABETALOL HCL 100 MG TABLET: 100 | 14 days supply | Qty: 42 | Fill #0

## 2017-06-15 ENCOUNTER — Telehealth: Payer: Self-pay | Admitting: *Deleted

## 2017-06-15 ENCOUNTER — Encounter: Payer: Self-pay | Admitting: Diagnostic Neuroimaging

## 2017-06-15 ENCOUNTER — Ambulatory Visit: Payer: Medicaid Other | Admitting: Diagnostic Neuroimaging

## 2017-06-15 NOTE — Telephone Encounter (Signed)
Patient no-showed today's appointment.

## 2017-06-24 MED FILL — AMLODIPINE BESYLATE 10 MG T: 10 | 30 days supply | Qty: 30 | Fill #1

## 2017-06-24 MED FILL — ATORVASTATIN 10 MG TABLET: 10 | 30 days supply | Qty: 30 | Fill #1

## 2017-06-24 MED FILL — SERTRALINE HCL 50 MG TABLET: 50 | 30 days supply | Qty: 30 | Fill #1

## 2017-07-19 MED FILL — METOPROLOL SUCCINATE ER 25: 25 | 30 days supply | Qty: 30 | Fill #0

## 2017-07-29 MED FILL — ATORVASTATIN 10 MG TABLET: 10 | 30 days supply | Qty: 30 | Fill #2

## 2017-07-29 MED FILL — AMLODIPINE BESYLATE 10 MG T: 10 | 30 days supply | Qty: 30 | Fill #2

## 2017-07-29 MED FILL — SERTRALINE HCL 50 MG TABLET: 50 | 30 days supply | Qty: 30 | Fill #2

## 2017-08-27 MED FILL — SERTRALINE HCL 50 MG TABLET: 50 | 30 days supply | Qty: 30 | Fill #3

## 2017-08-27 MED FILL — ATORVASTATIN 10 MG TABLET: 10 | 30 days supply | Qty: 30 | Fill #3

## 2017-08-30 MED FILL — METOPROLOL SUCCINATE ER 50: 50 | 90 days supply | Qty: 90 | Fill #0

## 2017-10-07 ENCOUNTER — Telehealth: Payer: Self-pay | Admitting: Diagnostic Neuroimaging

## 2017-10-07 NOTE — Telephone Encounter (Signed)
Pt's daughter Tiffany/DPR called, the family has noticed over the past 2-4 weeks he is not interacting when he is spoken to, just looks at them. Please call to advise he can be seen sooner than 11/11.

## 2017-10-07 NOTE — Telephone Encounter (Signed)
Called daughter, Jonelle Sidle and reviewed past phone notes with her. Advised her the concerns seem to be consistent, and that a FU was scheduled with Dr Leta Baptist in June. However he did not come nor call to cancel. She stated that he was better because they found out it was a BP issue. The PCP changed his BP medication.  This RN advised her that he last saw his PCP in May, and so this RN recommends he see his PCP again to be evaluated. She stated he just saw another dr and had labs, BP check which were okay. She stated his PCP had told her that he needed to see his neurologist or go to ED. She stated she doesn't see the need to go to ED since his symptoms aren't severe.  She then stated it was never explained to them what parts of his brain were affected by his brain hemorrhage, so she doesn't know what to expect. This RN advised her the only sooner FU is 11/09/17; he is scheduled for 11/15/17. She refused new appt stating they cannot get him out of bed in the morning.  This RN put him on the wait list for opening in the afternoon. She verbalized understanding, appreciation.

## 2017-10-09 ENCOUNTER — Emergency Department (HOSPITAL_COMMUNITY): Payer: Medicaid Other

## 2017-10-09 ENCOUNTER — Encounter (HOSPITAL_COMMUNITY): Payer: Self-pay

## 2017-10-09 ENCOUNTER — Emergency Department (HOSPITAL_COMMUNITY)
Admission: EM | Admit: 2017-10-09 | Discharge: 2017-10-10 | Disposition: A | Discharge status: Home / Self Care | Payer: Medicaid Other | Attending: Emergency Medicine | Admitting: Emergency Medicine

## 2017-10-09 DIAGNOSIS — E1122 Type 2 diabetes mellitus with diabetic chronic kidney disease: Secondary | ICD-10-CM | POA: Insufficient documentation

## 2017-10-09 DIAGNOSIS — I129 Hypertensive chronic kidney disease with stage 1 through stage 4 chronic kidney disease, or unspecified chronic kidney disease: Secondary | ICD-10-CM | POA: Insufficient documentation

## 2017-10-09 DIAGNOSIS — R410 Disorientation, unspecified: Secondary | ICD-10-CM | POA: Diagnosis present

## 2017-10-09 DIAGNOSIS — Z79899 Other long term (current) drug therapy: Secondary | ICD-10-CM | POA: Diagnosis not present

## 2017-10-09 DIAGNOSIS — N183 Chronic kidney disease, stage 3 (moderate): Secondary | ICD-10-CM | POA: Diagnosis not present

## 2017-10-09 LAB — CBC WITH DIFFERENTIAL/PLATELET
Abs Immature Granulocytes: 0 10*3/uL (ref 0.0–0.1)
BASOS PCT: 1 %
Basophils Absolute: 0.1 10*3/uL (ref 0.0–0.1)
EOS ABS: 0.2 10*3/uL (ref 0.0–0.7)
EOS PCT: 2 %
HCT: 40.5 % (ref 39.0–52.0)
Hemoglobin: 12.6 g/dL — ABNORMAL LOW (ref 13.0–17.0)
IMMATURE GRANULOCYTES: 0 %
Lymphocytes Relative: 27 %
Lymphs Abs: 2 10*3/uL (ref 0.7–4.0)
MCH: 29.1 pg (ref 26.0–34.0)
MCHC: 31.1 g/dL (ref 30.0–36.0)
MCV: 93.5 fL (ref 78.0–100.0)
MONOS PCT: 8 %
Monocytes Absolute: 0.6 10*3/uL (ref 0.1–1.0)
NEUTROS PCT: 62 %
Neutro Abs: 4.6 10*3/uL (ref 1.7–7.7)
PLATELETS: 196 10*3/uL (ref 150–400)
RBC: 4.33 MIL/uL (ref 4.22–5.81)
RDW: 12.7 % (ref 11.5–15.5)
WBC: 7.4 10*3/uL (ref 4.0–10.5)

## 2017-10-09 LAB — URINALYSIS, ROUTINE W REFLEX MICROSCOPIC
Bilirubin Urine: NEGATIVE
GLUCOSE, UA: NEGATIVE mg/dL
Hgb urine dipstick: NEGATIVE
Ketones, ur: NEGATIVE mg/dL
LEUKOCYTES UA: NEGATIVE
NITRITE: NEGATIVE
PH: 5 (ref 5.0–8.0)
Protein, ur: NEGATIVE mg/dL
Specific Gravity, Urine: 1.018 (ref 1.005–1.030)

## 2017-10-09 LAB — RAPID URINE DRUG SCREEN, HOSP PERFORMED
Amphetamines: NOT DETECTED
BARBITURATES: NOT DETECTED
BENZODIAZEPINES: NOT DETECTED
Cocaine: NOT DETECTED
Opiates: NOT DETECTED
Tetrahydrocannabinol: NOT DETECTED

## 2017-10-09 LAB — COMPREHENSIVE METABOLIC PANEL
ALBUMIN: 3.8 g/dL (ref 3.5–5.0)
ALK PHOS: 41 U/L (ref 38–126)
ALT: 10 U/L (ref 0–44)
ANION GAP: 4 — AB (ref 5–15)
AST: 12 U/L — ABNORMAL LOW (ref 15–41)
BUN: 24 mg/dL — ABNORMAL HIGH (ref 6–20)
CALCIUM: 9.3 mg/dL (ref 8.9–10.3)
CHLORIDE: 108 mmol/L (ref 98–111)
CO2: 29 mmol/L (ref 22–32)
Creatinine, Ser: 2.72 mg/dL — ABNORMAL HIGH (ref 0.61–1.24)
GFR calc non Af Amer: 27 mL/min — ABNORMAL LOW (ref >60)
GFR, EST AFRICAN AMERICAN: 31 mL/min — AB (ref >60)
GLUCOSE: 96 mg/dL (ref 70–99)
Potassium: 4.9 mmol/L (ref 3.5–5.1)
Sodium: 141 mmol/L (ref 135–145)
Total Bilirubin: 0.7 mg/dL (ref 0.3–1.2)
Total Protein: 7.1 g/dL (ref 6.5–8.1)

## 2017-10-09 LAB — ETHANOL: Alcohol, Ethyl (B): <10 mg/dL (ref <10)

## 2017-10-09 LAB — PROTIME-INR
INR: 1.08
PROTHROMBIN TIME: 13.9 s (ref 11.4–15.2)

## 2017-10-09 LAB — I-STAT TROPONIN, ED: TROPONIN I, POC: 0 ng/mL (ref 0.00–0.08)

## 2017-10-09 NOTE — ED Notes (Signed)
ED Provider at bedside. 

## 2017-10-09 NOTE — ED Notes (Signed)
Pt transported to CT ?

## 2017-10-09 NOTE — ED Notes (Signed)
Put condom catheter on Pt at 14:13.

## 2017-10-09 NOTE — ED Triage Notes (Signed)
Pt presents from group home with report of pt being altered.  Pt walked into kitchen undressed which is abnormal and "stared" through staff.  Unsure if pt is at baseline mental status.

## 2017-10-09 NOTE — Discharge Instructions (Signed)
These return for any problem.  Follow-up with your regular doctor as instructed. °

## 2017-10-09 NOTE — ED Notes (Signed)
Pt given water to drink. 

## 2017-10-09 NOTE — ED Notes (Signed)
Obtained urine from condom cath and sent to lab

## 2017-10-09 NOTE — ED Provider Notes (Addendum)
[place] EMERGENCY DEPARTMENT Provider Note   CSN: [id number] Arrival date & time: 10/09/17  1245     History   Chief Complaint Chief Complaint  Patient presents with  . Altered Mental Status    Adam Vincent is a 47 y.o. male.  47 year old male with prior medical history as detailed below presents for evaluation of altered mental status.  Patient resides at a group home.  This morning he came out of his room in his underwear and appeared to be "confused".  He is minimally verbal at baseline.  Upon my evaluation he denies acute complaint.  EMS reports that he seems to be improved with transport.  Upon arrival to the ED he appears to be back to his mental status baseline.  Patient without a prior history of seizure or seizure activity.  No witnessed seizure activity this morning.  No recent illness per report.  Additional history is difficult to obtain from the patient.    The history is provided by the patient, medical records, the EMS personnel and the nursing home.  Altered Mental Status   This is a new problem. The current episode started 6 to 12 hours ago. The problem has been resolved. Associated symptoms include confusion and violence. Pertinent negatives include no seizures, no unresponsiveness, no weakness, no agitation, no delusions and no hallucinations.    Past Medical History:  Diagnosis Date  . Diabetes mellitus   . Hypertension   . Stroke [facility])     Patient Active Problem List   Diagnosis Date Noted  . Acute renal failure ([place]) 07/03/2016  . Normocytic anemia 07/03/2016  . 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 Depression 02/26/2016  . OSA (obstructive sleep apnea) 02/26/2016  . Hyperlipidemia 02/26/2016  . Hydrocephalus ([place])   . Cerebrovascular accident (CVA) ([place]) - B watershed d/t strict BP control   . Morbid obesity ([place])   . Marijuana abuse   . Benign essential HTN   . Diabetes  mellitus type 2 in obese ([place])   . Fever   . Acute blood loss anemia   . Dysphagia   . Stage 3 chronic kidney disease ([place])   . Lethargy   . Altered mental state   . Uncontrolled hypertension     Past Surgical History:  Procedure Laterality Date  . ABSCESS DRAINAGE     Left leg        Home Medications    Prior to Admission medications   Medication Sig Start Date End Date Taking? Authorizing Provider  amLODipine (NORVASC) 10 MG tablet Take 1 tablet (10 mg total) by mouth daily. 02/27/16   Donzetta Starch, NP  atorvastatin (LIPITOR) 10 MG tablet Take 1 tablet (10 mg total) by mouth daily at 6 PM. Patient taking differently: Take 10 mg by mouth daily.  02/26/16   Donzetta Starch, NP  Cholecalciferol (VITAMIN D3) 2000 units TABS Take 2,000 Units by mouth daily.     [provider]  labetalol (NORMODYNE) 100 MG tablet Take 1 tablet (100 mg total) by mouth 3 (three) times daily for 14 days. 05/25/17 06/08/17  Duffy Bruce, MD  sertraline (ZOLOFT) 50 MG tablet Take 1 tablet (50 mg total) by mouth daily. 02/27/16   Donzetta Starch, NP  triamcinolone cream (KENALOG) 0.1 % Apply 1 application topically daily as needed (to affected areas for itching).     [provider]    Family History Family History  Problem Relation Age of Onset  . Cataracts Maternal Grandfather   . Amblyopia Neg Hx   . Blindness Neg Hx   . Diabetes Neg Hx   . Glaucoma Neg Hx   . Macular degeneration Neg Hx   . Retinal detachment Neg Hx   . Strabismus Neg Hx   . Retinitis pigmentosa Neg Hx     Social History Social History   Tobacco Use  . Smoking status: Never Smoker  . Smokeless tobacco: Never Used  Substance Use Topics  . Alcohol use: No  . Drug use: Yes    Types: Marijuana     Allergies   Patient has no known allergies.   Review of Systems Review of Systems  Neurological: Negative for seizures and weakness.  Psychiatric/Behavioral: Positive for confusion. Negative for  agitation and hallucinations.  All other systems reviewed and are negative.    Physical Exam Updated Vital Signs BP (!) 157/110   Pulse (!) 46   Temp 97.6 F (36.4 C) (Oral)   Resp 14   SpO2 97%   Physical Exam  Constitutional: He appears well-developed and well-nourished. No distress.  HENT:  Head: Normocephalic and atraumatic.  Mouth/Throat: Oropharynx is clear and moist.  Eyes: Pupils are equal, round, and reactive to light. Conjunctivae and EOM are normal.  Neck: Normal range of motion. Neck supple.  Cardiovascular: Normal rate, regular rhythm and normal heart sounds.  Pulmonary/Chest: Effort normal and breath sounds normal. No respiratory distress.  Abdominal: Soft. He exhibits no distension. There is no tenderness.  Musculoskeletal: Normal range of motion. He exhibits no edema or deformity.  Neurological: He is alert.  Alert  Responds to verbal questioning with simple sentences.   Oriented to person and place.   No new focal deficit.   Skin: Skin is warm and dry.  Psychiatric: He has a normal mood and affect.  Nursing note and vitals reviewed.    ED Treatments / Results  Labs (all labs ordered are listed, but only abnormal results are displayed) Labs Reviewed  CBC WITH DIFFERENTIAL/PLATELET - Abnormal; Notable for the following components:      Result Value   Hemoglobin 12.6 (*)    All other components within normal limits  COMPREHENSIVE METABOLIC PANEL - Abnormal; Notable for the following components:   BUN 24 (*)    Creatinine, Ser 2.72 (*)    AST 12 (*)    GFR calc non Af Amer 27 (*)    GFR calc Af Amer 31 (*)    Anion gap 4 (*)    All other components within normal limits  URINALYSIS, ROUTINE W REFLEX MICROSCOPIC  ETHANOL  PROTIME-INR  RAPID URINE DRUG SCREEN, HOSP PERFORMED  I-STAT TROPONIN, ED    EKG EKG Interpretation  Date/Time:  Saturday October 09 2017 12:50:59 EDT Ventricular Rate:  48 PR Interval:    QRS Duration: 97 QT  Interval:  459 QTC Calculation: 411 R Axis:   87 Text Interpretation:  Sinus bradycardia Low voltage, extremity leads Anteroseptal infarct, old Confirmed by Dene Gentry [phone] on 10/09/2017 12:54:10 PM   Radiology Ct Head Wo Contrast  Result Date: 10/09/2017 CLINICAL DATA:  Altered level of consciousness EXAM: CT HEAD WITHOUT CONTRAST TECHNIQUE: Contiguous axial images were obtained from the base of the skull through the vertex without intravenous contrast. COMPARISON:  05/25/2017 FINDINGS: Brain: There is atrophy and chronic small vessel disease changes. No acute intracranial abnormality. Specifically, no hemorrhage, hydrocephalus, mass lesion, acute infarction, or significant intracranial injury. Old bilateral  thalamic lacunar infarcts. Vascular: No hyperdense vessel or unexpected calcification. Skull: No acute calvarial abnormality. Sinuses/Orbits: Visualized paranasal sinuses and mastoids clear. Orbital soft tissues unremarkable. Other: None IMPRESSION: Age advanced atrophy, chronic small vessel disease, stable since prior study. Old bilateral thalamic lacunar infarcts. No acute intracranial abnormality. Electronically Signed   By: Rolm Baptise [name]   On: 10/09/2017 13:56   Dg Chest Port 1 View  Result Date: 10/09/2017 CLINICAL DATA:  Weakness EXAM: PORTABLE CHEST 1 VIEW COMPARISON:  05/25/2017 FINDINGS: Mild cardiomegaly. No confluent airspace opacities or effusions. No acute bony abnormality. IMPRESSION: No active disease. Electronically Signed   By: Rolm Baptise [name]   On: 10/09/2017 14:11    Procedures Procedures (including critical care time)  Medications Ordered in ED Medications - No data to display   Initial Impression / Assessment and Plan / ED Course  I have reviewed the triage vital signs and the nursing notes.  Pertinent labs & imaging results that were available during my care of the patient were reviewed by me and considered in my medical decision making (see chart for  details).     MDM  Screen complete  Patient is presenting today for evaluation of transient confusion.  Patient's symptoms appear to have been an ongoing issue for at least the last 1 to 2 weeks.  Per patient's family he apparently has "days that are good and then days that are bad."  Symptoms today appear to be mild.  Screening labs and work-up does not reveal significant acute pathology.  Patient appears to be completely back to his baseline.  He appears to be stable for discharge.  Patient and family understand the need for close follow-up.  Home health needs were discussed.  An order for outpatient OT/PT evaluation has been placed.  Patient and family understand strict return precautions.  2000 Addendum. Just prior to discharge family requests MRI brain. They "just want to know for sure that he has not had another stroke." MRI ordered. DC held.     Final Clinical Impressions(s) / ED Diagnoses   Final diagnoses:  Confusion    ED Discharge Orders         [place]     10/09/17 1955    Face-to-face encounter (required for Medicare/Medicaid patients)    Comments:  [place] certify that this patient is under my care and that I, or a nurse practitioner or physician's assistant working with me, had a face-to-face encounter that meets the physician face-to-face encounter requirements with this patient on 10/09/2017. The encounter with the patient was in whole, or in part for the following medical condition(s) which is the primary reason for home health care (List medical condition): CVA   10/09/17 1955           Valarie Merino, MD 10/09/17 1958    Valarie Merino, MD 10/09/17 2007

## 2017-10-09 NOTE — ED Notes (Signed)
Patient transported to MRI 

## 2017-10-10 ENCOUNTER — Emergency Department (HOSPITAL_COMMUNITY): Payer: Medicaid Other

## 2017-10-10 NOTE — ED Provider Notes (Signed)
1:49 AM MRI reviewed which is negative for new stroke or bleed.  These findings have been discussed with the patient's daughter who verbalizes understanding.  Patient stable for discharge and outpatient primary care and neurology follow-up.  Return precautions provided. Patient discharged in stable condition in care of daughter; family with no unaddressed concerns.  Results for orders placed or performed during the hospital encounter of 10/09/17  CBC with Differential  Result Value Ref Range   WBC 7.4 4.0 - 10.5 K/uL   RBC 4.33 4.22 - 5.81 MIL/uL   Hemoglobin 12.6 (L) 13.0 - 17.0 g/dL   HCT 40.5 39.0 - 52.0 %   MCV 93.5 78.0 - 100.0 fL   MCH 29.1 26.0 - 34.0 pg   MCHC 31.1 30.0 - 36.0 g/dL   RDW 12.7 11.5 - 15.5 %   Platelets 196 150 - 400 K/uL   Neutrophils Relative % 62 %   Neutro Abs 4.6 1.7 - 7.7 K/uL   Lymphocytes Relative 27 %   Lymphs Abs 2.0 0.7 - 4.0 K/uL   Monocytes Relative 8 %   Monocytes Absolute 0.6 0.1 - 1.0 K/uL   Eosinophils Relative 2 %   Eosinophils Absolute 0.2 0.0 - 0.7 K/uL   Basophils Relative 1 %   Basophils Absolute 0.1 0.0 - 0.1 K/uL   Immature Granulocytes 0 %   Abs Immature Granulocytes 0.0 0.0 - 0.1 K/uL  Comprehensive metabolic panel  Result Value Ref Range   Sodium 141 135 - 145 mmol/L   Potassium 4.9 3.5 - 5.1 mmol/L   Chloride 108 98 - 111 mmol/L   CO2 29 22 - 32 mmol/L   Glucose, Bld 96 70 - 99 mg/dL   BUN 24 (H) 6 - 20 mg/dL   Creatinine, Ser 2.72 (H) 0.61 - 1.24 mg/dL   Calcium 9.3 8.9 - 10.3 mg/dL   Total Protein 7.1 6.5 - 8.1 g/dL   Albumin 3.8 3.5 - 5.0 g/dL   AST 12 (L) 15 - 41 U/L   ALT 10 0 - 44 U/L   Alkaline Phosphatase 41 38 - 126 U/L   Total Bilirubin 0.7 0.3 - 1.2 mg/dL   GFR calc non Af Amer 27 (L) >60 mL/min   GFR calc Af Amer 31 (L) >60 mL/min   Anion gap 4 (L) 5 - 15  Urinalysis, Routine w reflex microscopic  Result Value Ref Range   Color, Urine YELLOW YELLOW   APPearance CLEAR CLEAR   Specific Gravity, Urine 1.018  1.005 - 1.030   pH 5.0 5.0 - 8.0   Glucose, UA NEGATIVE NEGATIVE mg/dL   Hgb urine dipstick NEGATIVE NEGATIVE   Bilirubin Urine NEGATIVE NEGATIVE   Ketones, ur NEGATIVE NEGATIVE mg/dL   Protein, ur NEGATIVE NEGATIVE mg/dL   Nitrite NEGATIVE NEGATIVE   Leukocytes, UA NEGATIVE NEGATIVE  Ethanol  Result Value Ref Range   Alcohol, Ethyl (B) <10 <10 mg/dL  Protime-INR  Result Value Ref Range   Prothrombin Time 13.9 11.4 - 15.2 seconds   INR 1.08   Urine rapid drug screen (hosp performed)  Result Value Ref Range   Opiates NONE DETECTED NONE DETECTED   Cocaine NONE DETECTED NONE DETECTED   Benzodiazepines NONE DETECTED NONE DETECTED   Amphetamines NONE DETECTED NONE DETECTED   Tetrahydrocannabinol NONE DETECTED NONE DETECTED   Barbiturates NONE DETECTED NONE DETECTED  I-stat troponin, ED  Result Value Ref Range   Troponin i, poc 0.00 0.00 - 0.08 ng/mL   Comment 3  Ct Head Wo Contrast  Result Date: 10/09/2017 CLINICAL DATA:  Altered level of consciousness EXAM: CT HEAD WITHOUT CONTRAST TECHNIQUE: Contiguous axial images were obtained from the base of the skull through the vertex without intravenous contrast. COMPARISON:  05/25/2017 FINDINGS: Brain: There is atrophy and chronic small vessel disease changes. No acute intracranial abnormality. Specifically, no hemorrhage, hydrocephalus, mass lesion, acute infarction, or significant intracranial injury. Old bilateral thalamic lacunar infarcts. Vascular: No hyperdense vessel or unexpected calcification. Skull: No acute calvarial abnormality. Sinuses/Orbits: Visualized paranasal sinuses and mastoids clear. Orbital soft tissues unremarkable. Other: None IMPRESSION: Age advanced atrophy, chronic small vessel disease, stable since prior study. Old bilateral thalamic lacunar infarcts. No acute intracranial abnormality. Electronically Signed   By: Rolm Baptise [name]   On: 10/09/2017 13:56   [name] (neuro Protocol)  Result Date:  10/10/2017 CLINICAL DATA:  47 y/o M; left-sided weakness. Slow to communicate. EXAM: MRI HEAD WITHOUT CONTRAST TECHNIQUE: Multiplanar, multiecho pulse sequences of the brain and surrounding structures were obtained without intravenous contrast. COMPARISON:  10/09/2017 CT head.  02/20/2016 MRI of the head. FINDINGS: Brain: No acute infarction, hemorrhage, hydrocephalus, extra-axial collection or mass lesion. Small chronic infarcts are present within the bilateral centrum semiovale, right genu of corpus callosum, bilateral putamen, and left thalamus. There is a large chronic hemosiderin stained infarct of the right thalamus. There several scattered foci of susceptibility hypointensity within the left cerebellum, brainstem and scattered throughout the left-greater-than-right cerebral cortex compatible with hemosiderin deposition of chronic microhemorrhage. Confluent T2 FLAIR hyperintense signal abnormality is present throughout supratentorial periventricular and subcortical white matter. Advanced for age volume loss of the brain. Vascular: Normal flow voids. Skull and upper cervical spine: Normal marrow signal. Sinuses/Orbits: Mild paranasal sinus mucosal thickening. No abnormal signal of mastoid air cells. Orbits are unremarkable. Other: None. IMPRESSION: 1. No acute intracranial abnormality identified. 2. Progression of advanced chronic microvascular ischemic changes and volume loss of the brain for age. Several small chronic infarctions are increased in number in comparison with the prior MRI. Electronically Signed   By: Kristine Garbe [name]   On: 10/10/2017 01:37   Dg Chest Port 1 View  Result Date: 10/09/2017 CLINICAL DATA:  Weakness EXAM: PORTABLE CHEST 1 VIEW COMPARISON:  05/25/2017 FINDINGS: Mild cardiomegaly. No confluent airspace opacities or effusions. No acute bony abnormality. IMPRESSION: No active disease. Electronically Signed   By: Rolm Baptise [name]   On: 10/09/2017 14:11      Antonietta Breach, PA-C 80/32/12 [id number]    Delora Fuel, MD 50/03/70 [phone]

## 2017-10-10 NOTE — ED Notes (Signed)
Patient and pt daughter verbalizes understanding of discharge instructions. Opportunity for questioning and answers were provided. Armband removed by staff, pt discharged from ED ambulatory.

## 2017-10-10 NOTE — ED Notes (Signed)
Pt requested a sprite and was given the same ok per RN Almyra Free

## 2017-10-16 ENCOUNTER — Emergency Department (HOSPITAL_COMMUNITY)
Admission: EM | Admit: 2017-10-16 | Discharge: 2017-10-16 | Disposition: A | Discharge status: Home / Self Care | Payer: Medicaid Other | Attending: Emergency Medicine | Admitting: Emergency Medicine

## 2017-10-16 ENCOUNTER — Emergency Department (HOSPITAL_COMMUNITY): Payer: Medicaid Other

## 2017-10-16 ENCOUNTER — Encounter (HOSPITAL_COMMUNITY): Payer: Self-pay | Admitting: Emergency Medicine

## 2017-10-16 DIAGNOSIS — R55 Syncope and collapse: Secondary | ICD-10-CM | POA: Diagnosis not present

## 2017-10-16 DIAGNOSIS — E119 Type 2 diabetes mellitus without complications: Secondary | ICD-10-CM | POA: Diagnosis not present

## 2017-10-16 DIAGNOSIS — I10 Essential (primary) hypertension: Secondary | ICD-10-CM | POA: Insufficient documentation

## 2017-10-16 DIAGNOSIS — Z79899 Other long term (current) drug therapy: Secondary | ICD-10-CM | POA: Insufficient documentation

## 2017-10-16 LAB — TROPONIN I: Troponin I: <0.03 ng/mL (ref <0.03)

## 2017-10-16 LAB — CBC WITH DIFFERENTIAL/PLATELET
ABS IMMATURE GRANULOCYTES: 0.04 10*3/uL (ref 0.00–0.07)
BASOS ABS: 0 10*3/uL (ref 0.0–0.1)
Basophils Relative: 0 %
Eosinophils Absolute: 0.1 10*3/uL (ref 0.0–0.5)
Eosinophils Relative: 1 %
HEMATOCRIT: 42.5 % (ref 39.0–52.0)
Hemoglobin: 13.3 g/dL (ref 13.0–17.0)
IMMATURE GRANULOCYTES: 0 %
LYMPHS ABS: 1.4 10*3/uL (ref 0.7–4.0)
Lymphocytes Relative: 15 %
MCH: 29.6 pg (ref 26.0–34.0)
MCHC: 31.3 g/dL (ref 30.0–36.0)
MCV: 94.7 fL (ref 80.0–100.0)
MONOS PCT: 7 %
Monocytes Absolute: 0.6 10*3/uL (ref 0.1–1.0)
NEUTROS ABS: 7.1 10*3/uL (ref 1.7–7.7)
NEUTROS PCT: 77 %
NRBC: 0 % (ref 0.0–0.2)
Platelets: 211 10*3/uL (ref 150–400)
RBC: 4.49 MIL/uL (ref 4.22–5.81)
RDW: 12.8 % (ref 11.5–15.5)
WBC: 9.3 10*3/uL (ref 4.0–10.5)

## 2017-10-16 LAB — COMPREHENSIVE METABOLIC PANEL
ALBUMIN: 3.7 g/dL (ref 3.5–5.0)
ALT: 9 U/L (ref 0–44)
ANION GAP: 8 (ref 5–15)
AST: 16 U/L (ref 15–41)
Alkaline Phosphatase: 43 U/L (ref 38–126)
BILIRUBIN TOTAL: 0.5 mg/dL (ref 0.3–1.2)
BUN: 19 mg/dL (ref 6–20)
CHLORIDE: 107 mmol/L (ref 98–111)
CO2: 24 mmol/L (ref 22–32)
Calcium: 8.8 mg/dL — ABNORMAL LOW (ref 8.9–10.3)
Creatinine, Ser: 2.29 mg/dL — ABNORMAL HIGH (ref 0.61–1.24)
GFR calc Af Amer: 38 mL/min — ABNORMAL LOW (ref >60)
GFR calc non Af Amer: 33 mL/min — ABNORMAL LOW (ref >60)
GLUCOSE: 88 mg/dL (ref 70–99)
POTASSIUM: 4 mmol/L (ref 3.5–5.1)
Sodium: 139 mmol/L (ref 135–145)
TOTAL PROTEIN: 6.8 g/dL (ref 6.5–8.1)

## 2017-10-16 MED ORDER — SODIUM CHLORIDE 0.9 % IV SOLN
INTRAVENOUS | Status: DC
  Administered 2017-10-16: 13:00:00 via INTRAVENOUS

## 2017-10-16 NOTE — ED Triage Notes (Signed)
Pt arrives via [facility] from group home, staff called out stating they heard a thud, went into patient's room and found him sitting down on his bottom against the wall. Pt was alert and able to ambulate after fall. Ems reports pt was somewhat sluggish upon their arrival with bp of 78 systolically and HR of 60. After getting into ambulance, pts HR dropped to 48 and bp went up to 120/72. cbg 107. Ems 12 lead was unremarkable. Pt alert and oriented to person and place, disoriented to situation and time

## 2017-10-16 NOTE — Discharge Instructions (Addendum)
As discussed, your evaluation today has been largely reassuring.  But, it is important that you monitor your condition carefully, and do not hesitate to return to the ED if you develop new, or concerning changes in your condition. ? ?Otherwise, please follow-up with your physician for appropriate ongoing care. ? ?

## 2017-10-16 NOTE — ED Provider Notes (Signed)
[facility] EMERGENCY DEPARTMENT Provider Note   CSN: [id number] Arrival date & time: 10/16/17  4098     History   Chief Complaint Chief Complaint  Patient presents with  . Bradycardia  . Fall    HPI Adam Vincent is a 47 y.o. male.  HPI Patient presents after an episode of possible syncope, possible fall per Patient has cognitive delay, lives in a group facility, level 5 caveat secondary to the patient's capacity.  Does however, provide some details to the event preceding ED arrival.  Reportedly, the patient was in the bathroom, preparing to use the facilities, felt lightheaded, and either fell or lost consciousness.  When staff found him on the ground, weak in appearance, diaphoretic. EMS notes that on their arrival the patient denied pain, felt weak, was diaphoretic, hypotensive, but moving all extremities, interacting with them. Patient improved in route numerically, and clinically Currently the patient denies pain, weakness, lightheadedness, nausea.    Past Medical History:  Diagnosis Date  . Diabetes mellitus   . Hypertension   . Stroke [facility])     Patient Active Problem List   Diagnosis Date Noted  . Acute renal failure ([place]) 07/03/2016  . Normocytic anemia 07/03/2016  . ICH (intracerebral hemorrhage) ([place]) - R thalamic d/t HTN emergency 02/26/2016  . Depression 02/26/2016  . OSA (obstructive sleep apnea) 02/26/2016  . Hyperlipidemia 02/26/2016  . Hydrocephalus ([place])   . Cerebrovascular accident (CVA) ([place]) - B watershed d/t strict BP control   . Morbid obesity ([place])   . Marijuana abuse   . Benign essential HTN   . Diabetes mellitus type 2 in obese ([place])   . Fever   . Acute blood loss anemia   . Dysphagia   . Stage 3 chronic kidney disease ([place])   . Lethargy   . Altered mental state   . Uncontrolled hypertension     Past Surgical History:  Procedure Laterality Date  . ABSCESS DRAINAGE     Left leg        Home Medications     Prior to Admission medications   Medication Sig Start Date End Date Taking? Authorizing Provider  amLODipine (NORVASC) 10 MG tablet Take 1 tablet (10 mg total) by mouth daily. 02/27/16  Yes Donzetta Starch, NP  atorvastatin (LIPITOR) 10 MG tablet Take 1 tablet (10 mg total) by mouth daily at 6 PM. Patient taking differently: Take 10 mg by mouth daily.  02/26/16  Yes Donzetta Starch, NP  Cholecalciferol (VITAMIN D3) 2000 units TABS Take 2,000 Units by mouth daily.    Yes [provider]  metoprolol succinate (TOPROL-XL) 50 MG 24 hr tablet Take 50 mg by mouth daily. 08/30/17  Yes [provider]  pyridoxine (B-6) 100 MG tablet Take 100 mg by mouth daily.   Yes [provider]  sertraline (ZOLOFT) 50 MG tablet Take 1 tablet (50 mg total) by mouth daily. 02/27/16  Yes Donzetta Starch, NP  triamcinolone cream (KENALOG) 0.1 % Apply 1 application topically daily as needed (to affected areas for itching).    Yes [provider]  labetalol (NORMODYNE) 100 MG tablet Take 1 tablet (100 mg total) by mouth 3 (three) times daily for 14 days. Patient not taking: Reported on 10/09/2017 05/25/17 06/08/17  Duffy Bruce, MD    Family History Family History  Problem Relation Age of Onset  . Cataracts Maternal Grandfather   . Amblyopia Neg Hx   . Blindness Neg Hx   .  Diabetes Neg Hx   . Glaucoma Neg Hx   . Macular degeneration Neg Hx   . Retinal detachment Neg Hx   . Strabismus Neg Hx   . Retinitis pigmentosa Neg Hx     Social History Social History   Tobacco Use  . Smoking status: Never Smoker  . Smokeless tobacco: Never Used  Substance Use Topics  . Alcohol use: No  . Drug use: Yes    Types: Marijuana     Allergies   Coconut flavor   Review of Systems Review of Systems   Physical Exam Updated Vital Signs BP (!) 146/68   Pulse (!) 53   Temp (!) 97.5 F (36.4 C) (Oral)   Resp 17   SpO2 99%   Physical Exam   ED Treatments / Results  Labs (all  labs ordered are listed, but only abnormal results are displayed) Labs Reviewed  COMPREHENSIVE METABOLIC PANEL - Abnormal; Notable for the following components:      Result Value   Creatinine, Ser 2.29 (*)    Calcium 8.8 (*)    GFR calc non Af Amer 33 (*)    GFR calc Af Amer 38 (*)    All other components within normal limits  TROPONIN I  CBC WITH DIFFERENTIAL/PLATELET  CBG MONITORING, ED    EKG EKG Interpretation  Date/Time:  Saturday October 16 2017 09:52:11 EDT Ventricular Rate:  54 PR Interval:    QRS Duration: 104 QT Interval:  429 QTC Calculation: 407 R Axis:   93 Text Interpretation:  Sinus rhythm Prolonged PR interval Borderline right axis deviation Low voltage, precordial leads Borderline ECG Confirmed by Carmin Muskrat [phone]) on 10/16/2017 10:46:35 AM   Radiology Dg Chest 2 View  Result Date: 10/16/2017 CLINICAL DATA:  Recent fall EXAM: CHEST - 2 VIEW COMPARISON:  10/09/2017 FINDINGS: The heart size and mediastinal contours are within normal limits. Both lungs are clear. The visualized skeletal structures are unremarkable. IMPRESSION: No active cardiopulmonary disease. Electronically Signed   By: Inez Catalina [name]   On: 10/16/2017 10:45    Procedures Procedures (including critical care time)  Medications Ordered in ED Medications  0.9 %  sodium chloride infusion (has no administration in time range)     Initial Impression / Assessment and Plan / ED Course  I have reviewed the triage vital signs and the nursing notes.  Pertinent labs & imaging results that were available during my care of the patient were reviewed by me and considered in my medical decision making (see chart for details).     12:45 PM Patient in no distress awake, alert, hemodynamically unremarkable. Findings reassuring, no evidence for creamy, sepsis, no complaints, some suspicion for vagal episode, without evidence for posttraumatic deformity, lesions. After hours of monitoring, no  substantial change here, with reassuring findings, the patient was returned to his group home.  Final Clinical Impressions(s) / ED Diagnoses  Near syncope   Carmin Muskrat, MD 10/16/17 1245

## 2017-10-26 ENCOUNTER — Encounter: Payer: Self-pay | Admitting: Neurology

## 2017-11-07 IMAGING — MR MR HEAD W/O CM
10 of 12 series · 36 of 48 positions shown · non-contrast
Comparison: 02/06/2016

CLINICAL DATA: Stroke.  Patient wont eat.

EXAM:
MRI HEAD WITHOUT CONTRAST
TECHNIQUE: Multiplanar, multiecho pulse sequences of the brain and surrounding
structures were obtained without intravenous contrast.

[Series 2: FLAIR · sagittal · 5.0mm · 0.47mm/px · 1 of 26 slices shown (1 of 3)]
[im 1/26]
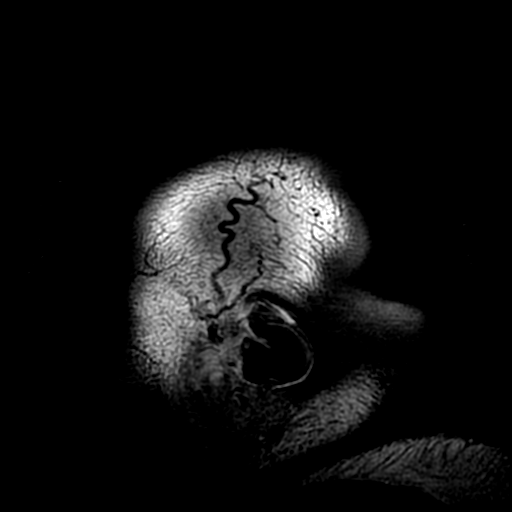

[Series 4: DWI · axial · 3.0mm · 1.02mm/px · z∈[-146,+7]mm · 8 of 110 slices shown (1 of 2)]
[im 1/110]
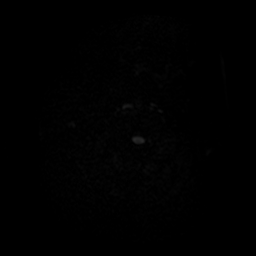
[im 16/110]
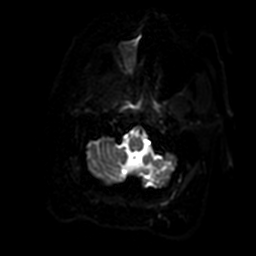
[im 32/110]
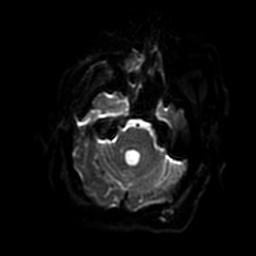
[im 47/110]
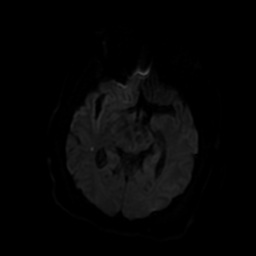
[im 63/110]
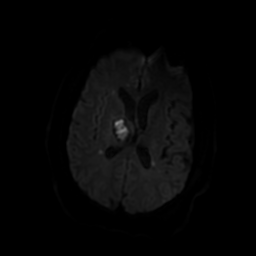
[im 78/110]
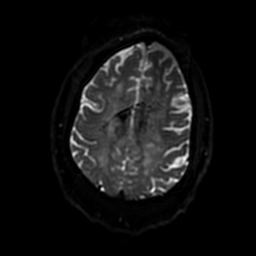
[im 94/110]
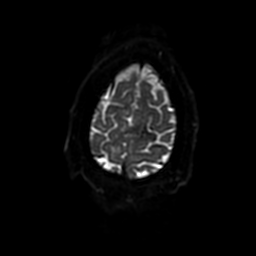
[im 110/110]
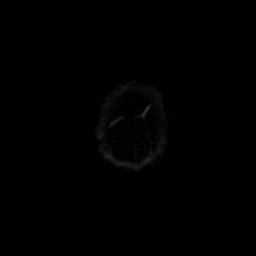

[Series 5: DWI · coronal · 5.0mm · 1.02mm/px · 5 of 74 slices shown (2 of 2)]
[im 1/74]
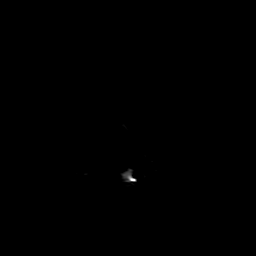
[im 19/74]
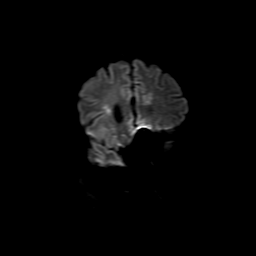
[im 37/74]
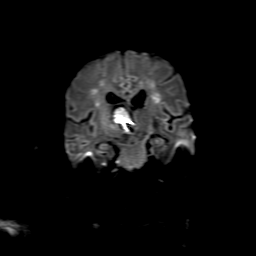
[im 55/74]
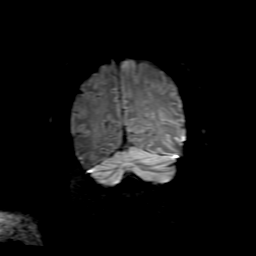
[im 74/74]
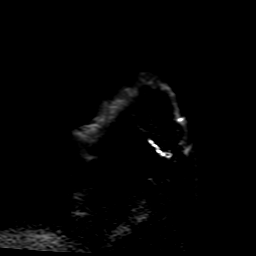

[Series 6: T2 · axial · 5.0mm · 0.47mm/px · z∈[-147,+3]mm · 2 of 28 slices shown (1 of 2)]
[im 1/28]
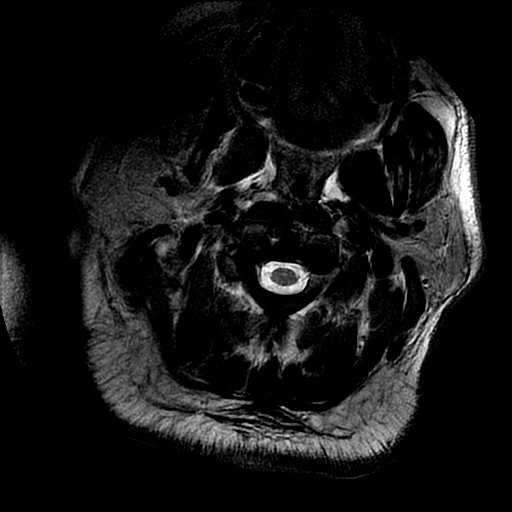
[im 28/28]
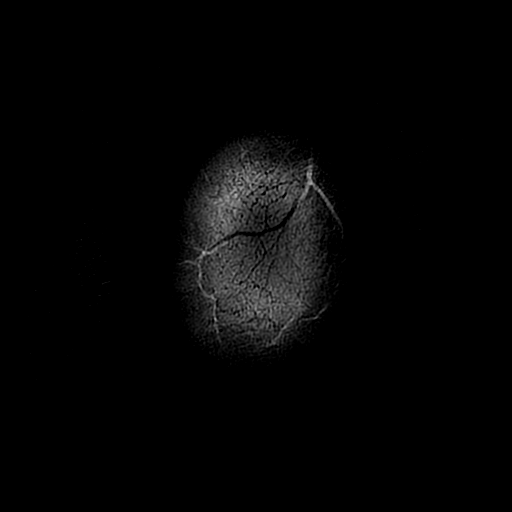

[Series 7: FLAIR · axial · 5.0mm · 0.47mm/px · z∈[-147,+3]mm · 2 of 28 slices shown (2 of 3)]
[im 1/28]
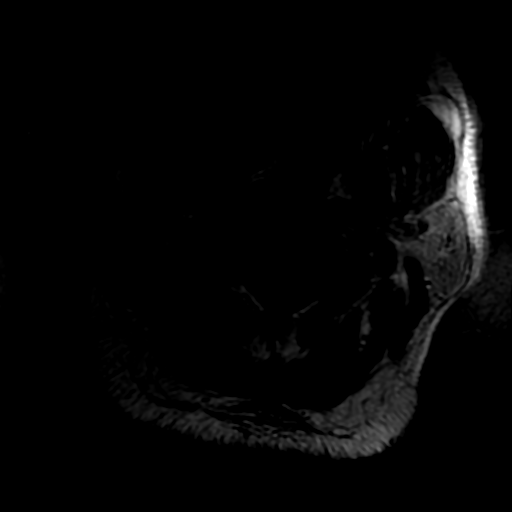
[im 28/28]
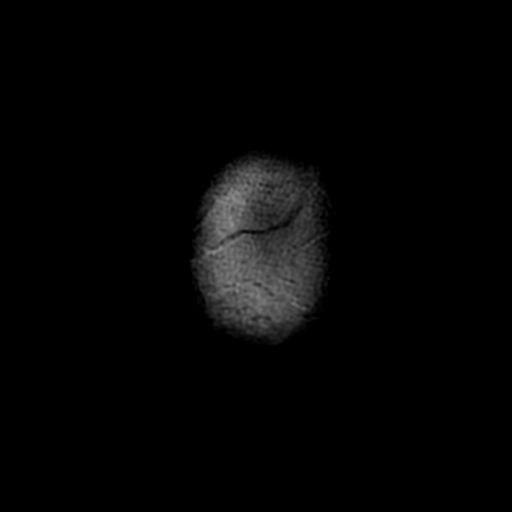

[Series 8: (person_name) · axial · 3.0mm · 0.47mm/px · z∈[-127,-9]mm · 6 of 100 slices shown]
[im 1/100]
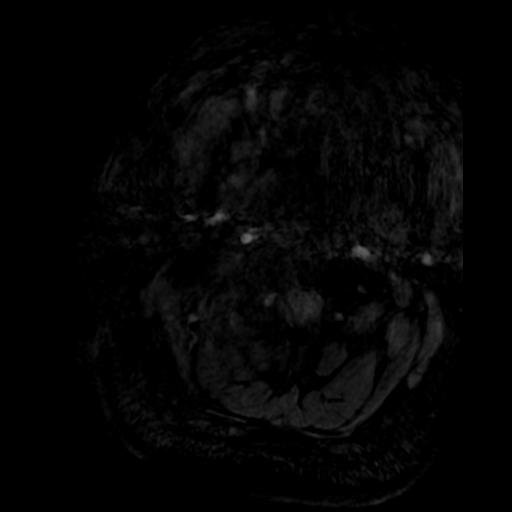
[im 17/100]
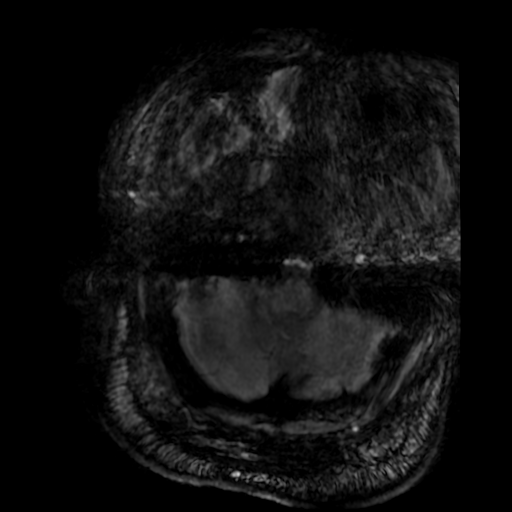
[im 34/100]
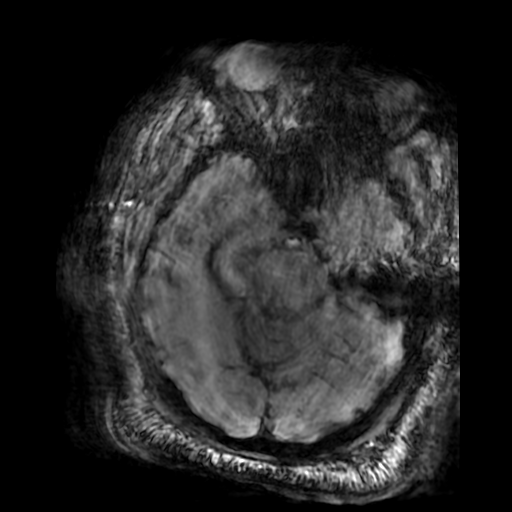
[im 50/100]
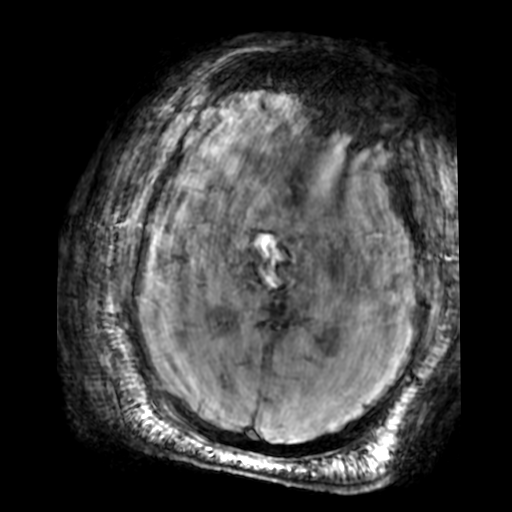
[im 67/100]
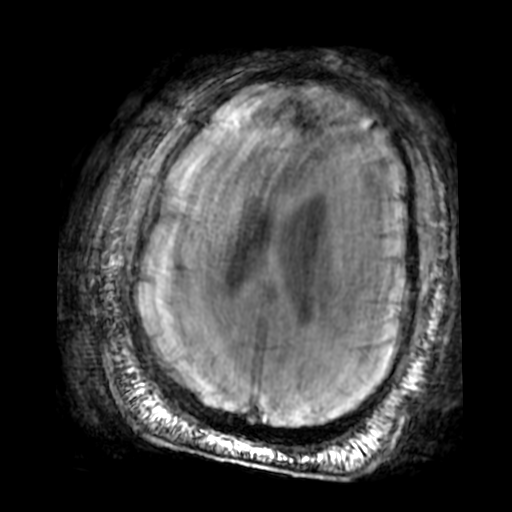
[im 83/100]
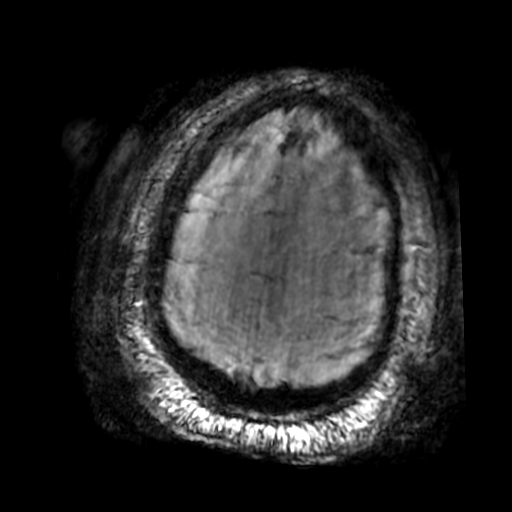

[Series 10: T2 · coronal · 5.0mm · 0.78mm/px · 3 of 37 slices shown (2 of 2)]
[im 1/37]
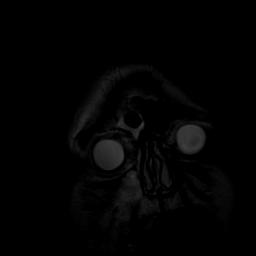
[im 19/37]
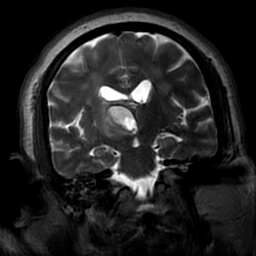
[im 37/37]
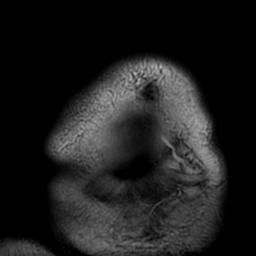

[Series 11: FLAIR · axial · 5.0mm · 0.94mm/px · z∈[-146,+4]mm · 2 of 28 slices shown (3 of 3)]
[im 1/28]
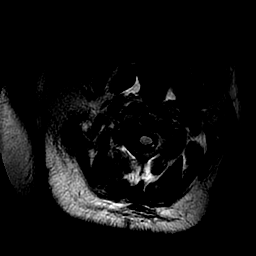
[im 28/28]
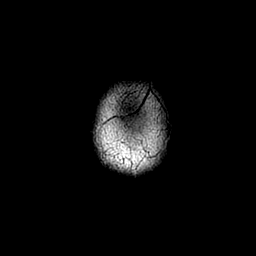

[Series 450: ADC · axial · 3.0mm · 1.02mm/px · z∈[-146,+7]mm · 4 of 55 slices shown (1 of 2)]
[im 1/55]
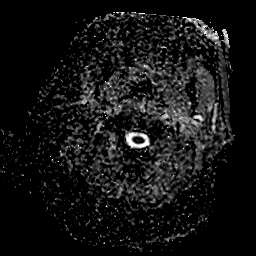
[im 19/55]
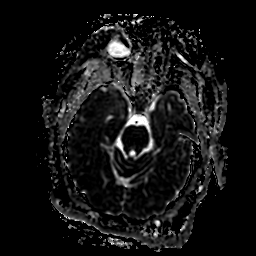
[im 37/55]
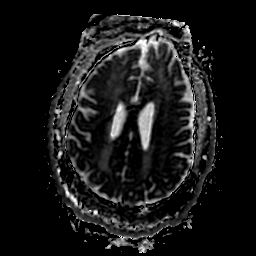
[im 55/55]
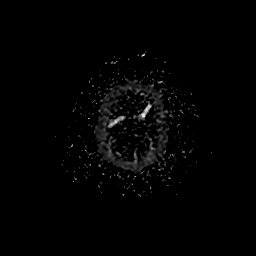

[Series 550: ADC · coronal · 5.0mm · 1.02mm/px · 3 of 37 slices shown (2 of 2)]
[im 1/37]
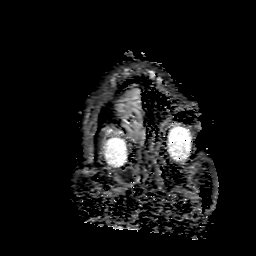
[im 19/37]
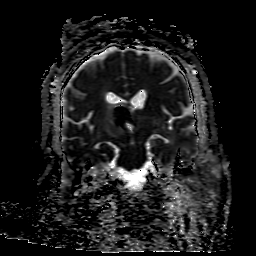
[im 37/37]
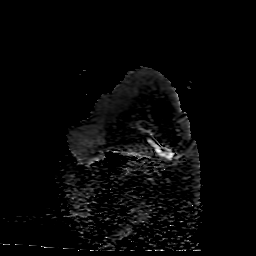

[36 of 48 positions shown; findings below may reference images not displayed]

FINDINGS: Brain: Expected evolution of subacute hematoma in the right thalamus
with hemosiderin rim and internal methemoglobin signal. The hematoma
is similar in size but surrounding edema is diminished. There has
been interval decrease in lateral and third ventricular volume, now
normal-appearing. No progression of bilateral linearly aligned
multiplicity of white matter infarcts in the cerebral hemispheres,
consistent with deep watershed pattern. Infarct in the right
superior cerebellar peduncle is also stable. No interval hemorrhage.
There is extensive white matter signal abnormality, likely chronic
microvascular disease given patient's vascular risk factors.

Vascular: Major vessels are patent.

Skull and upper cervical spine: No marrow lesion noted.

Sinuses/Orbits: Interval opacification of right-sided sinuses,
question of there has been right nasal intubation.
IMPRESSION: 1. No new intracranial abnormality.
2. Expected evolution of subacute hematoma in the right thalamus.
Vasogenic edema is decreased and ventriculomegaly on prior study is
resolved.
3. No progression of deep cerebral watershed and right superior
cerebellar peduncle infarcts.
4. Advanced for age chronic microvascular disease.
5. New and prominent sinusitis on the right. Has the patient had
nasal intubation on the right?

## 2017-11-15 ENCOUNTER — Encounter

## 2017-11-15 ENCOUNTER — Ambulatory Visit: Payer: Medicaid Other | Admitting: Diagnostic Neuroimaging

## 2017-11-21 ENCOUNTER — Emergency Department (HOSPITAL_COMMUNITY)
Admission: EM | Admit: 2017-11-21 | Discharge: 2017-11-21 | Disposition: A | Discharge status: Home / Self Care | Payer: Medicaid Other | Attending: Emergency Medicine | Admitting: Emergency Medicine

## 2017-11-21 ENCOUNTER — Other Ambulatory Visit: Payer: Self-pay

## 2017-11-21 ENCOUNTER — Encounter (HOSPITAL_COMMUNITY): Payer: Self-pay

## 2017-11-21 ENCOUNTER — Emergency Department (HOSPITAL_COMMUNITY): Payer: Medicaid Other

## 2017-11-21 DIAGNOSIS — E119 Type 2 diabetes mellitus without complications: Secondary | ICD-10-CM | POA: Insufficient documentation

## 2017-11-21 DIAGNOSIS — I10 Essential (primary) hypertension: Secondary | ICD-10-CM | POA: Diagnosis not present

## 2017-11-21 DIAGNOSIS — R55 Syncope and collapse: Secondary | ICD-10-CM | POA: Insufficient documentation

## 2017-11-21 DIAGNOSIS — W19XXXA Unspecified fall, initial encounter: Secondary | ICD-10-CM

## 2017-11-21 LAB — COMPREHENSIVE METABOLIC PANEL
ALBUMIN: 3.5 g/dL (ref 3.5–5.0)
ALK PHOS: 50 U/L (ref 38–126)
ALT: 19 U/L (ref 0–44)
ANION GAP: 5 (ref 5–15)
AST: 23 U/L (ref 15–41)
BUN: 31 mg/dL — AB (ref 6–20)
CO2: 27 mmol/L (ref 22–32)
Calcium: 8.8 mg/dL — ABNORMAL LOW (ref 8.9–10.3)
Chloride: 105 mmol/L (ref 98–111)
Creatinine, Ser: 2.37 mg/dL — ABNORMAL HIGH (ref 0.61–1.24)
GFR calc Af Amer: 36 mL/min — ABNORMAL LOW (ref >60)
GFR calc non Af Amer: 31 mL/min — ABNORMAL LOW (ref >60)
Glucose, Bld: 123 mg/dL — ABNORMAL HIGH (ref 70–99)
POTASSIUM: 4.5 mmol/L (ref 3.5–5.1)
SODIUM: 137 mmol/L (ref 135–145)
Total Bilirubin: 0.7 mg/dL (ref 0.3–1.2)
Total Protein: 6.9 g/dL (ref 6.5–8.1)

## 2017-11-21 LAB — CBC WITH DIFFERENTIAL/PLATELET
Abs Immature Granulocytes: 0.03 10*3/uL (ref 0.00–0.07)
Basophils Absolute: 0 10*3/uL (ref 0.0–0.1)
Basophils Relative: 1 %
EOS ABS: 0.1 10*3/uL (ref 0.0–0.5)
Eosinophils Relative: 2 %
HCT: 38.4 % — ABNORMAL LOW (ref 39.0–52.0)
HEMOGLOBIN: 11.5 g/dL — AB (ref 13.0–17.0)
Immature Granulocytes: 0 %
Lymphocytes Relative: 17 %
Lymphs Abs: 1.3 10*3/uL (ref 0.7–4.0)
MCH: 28.1 pg (ref 26.0–34.0)
MCHC: 29.9 g/dL — AB (ref 30.0–36.0)
MCV: 93.9 fL (ref 80.0–100.0)
Monocytes Absolute: 0.6 10*3/uL (ref 0.1–1.0)
Monocytes Relative: 8 %
NEUTROS PCT: 72 %
Neutro Abs: 5.8 10*3/uL (ref 1.7–7.7)
Platelets: 181 10*3/uL (ref 150–400)
RBC: 4.09 MIL/uL — AB (ref 4.22–5.81)
RDW: 13.5 % (ref 11.5–15.5)
WBC: 7.9 10*3/uL (ref 4.0–10.5)
nRBC: 0 % (ref 0.0–0.2)

## 2017-11-21 LAB — I-STAT TROPONIN, ED
TROPONIN I, POC: 0 ng/mL (ref 0.00–0.08)
TROPONIN I, POC: 0 ng/mL (ref 0.00–0.08)

## 2017-11-21 MED ORDER — SODIUM CHLORIDE 0.9 % IV BOLUS
500.0000 mL | Freq: Once | INTRAVENOUS | Status: AC
  Administered 2017-11-21: 500 mL via INTRAVENOUS

## 2017-11-21 NOTE — ED Notes (Signed)
Patient transported to CT 

## 2017-11-21 NOTE — ED Provider Notes (Signed)
Emergency Department Provider Note   I have reviewed the triage vital signs and the nursing notes.   HISTORY  Chief Complaint Loss of Consciousness   HPI Adam Vincent is a 47 y.o. male with PMH of DM, HTN, and CVA currently a resident at [facility] resents to the emergency department for evaluation of unwitnessed fall by staff.  No reported seizure activity according to EMS.  Patient does not recall the events surrounding the fall.  He does have history of stroke.  Patient does not recall experiencing any chest pain, lightheadedness, or syncope but has difficulty recalling the events specifically.  He denies any pain at this time.   Level 5 caveat: Prior CVA with decreased capacity and communication ability.   Past Medical History:  Diagnosis Date  . Diabetes mellitus   . Hypertension   . Stroke [facility])     Patient Active Problem List   Diagnosis Date Noted  . Acute renal failure ([place]) 07/03/2016  . Normocytic anemia 07/03/2016  . 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 Depression 02/26/2016  . OSA (obstructive sleep apnea) 02/26/2016  . Hyperlipidemia 02/26/2016  . Hydrocephalus ([place])   . Cerebrovascular accident (CVA) ([place]) - B watershed d/t strict BP control   . Morbid obesity ([place])   . Marijuana abuse   . Benign essential HTN   . Diabetes mellitus type 2 in obese ([place])   . Fever   . Acute blood loss anemia   . Dysphagia   . Stage 3 chronic kidney disease ([place])   . Lethargy   . Altered mental state   . Uncontrolled hypertension     Past Surgical History:  Procedure Laterality Date  . ABSCESS DRAINAGE     Left leg   Allergies Coconut flavor  Family History  Problem Relation Age of Onset  . Cataracts Maternal Grandfather   . Amblyopia Neg Hx   . Blindness Neg Hx   . Diabetes Neg Hx   . Glaucoma Neg Hx   . Macular degeneration Neg Hx   . Retinal detachment Neg Hx   . Strabismus Neg Hx   .  Retinitis pigmentosa Neg Hx     Social History Social History   Tobacco Use  . Smoking status: Never Smoker  . Smokeless tobacco: Never Used  Substance Use Topics  . Alcohol use: No  . Drug use: Yes    Types: Marijuana    Review of Systems  Constitutional: No fever/chills. Positive fall.  Eyes: No visual changes. ENT: No sore throat. Cardiovascular: Denies chest pain. Respiratory: Denies shortness of breath. Gastrointestinal: No abdominal pain.  No nausea, no vomiting.  No diarrhea.  No constipation. Genitourinary: Negative for dysuria. Musculoskeletal: Negative for back pain. Skin: Negative for rash. Neurological: Negative for headaches, focal weakness or numbness.  10-point ROS otherwise negative.  ____________________________________________   PHYSICAL EXAM:  VITAL SIGNS: Vitals:   11/21/17 1945 11/21/17 2043  BP: (!) 168/94 (!) 158/97  Pulse:  (!) 57  Resp:  16  Temp:  97.9 F (36.6 C)  SpO2:  100%    Constitutional: Alert and well-appearing. Some baseline memory impairments. Patient responding appropriate with 1-2 word answers.  Eyes: Conjunctivae are normal. PERRL.  Head: Atraumatic. Nose: No congestion/rhinnorhea. Mouth/Throat: Mucous membranes are moist.  Neck: No stridor. C-collar in place.  Cardiovascular: Bradycardia. Good peripheral circulation. Grossly normal heart sounds.   Respiratory: Normal respiratory effort.  No retractions. Lungs CTAB. Gastrointestinal: Soft  and nontender. No distention.  Musculoskeletal: No lower extremity tenderness nor edema. No gross deformities of extremities. Neurologic:  Normal speech with brief answers and slow to answer at times. No gross focal neurologic deficits are appreciated.  Skin:  Skin is warm, dry and intact. No rash noted.  ____________________________________________   LABS (all labs ordered are listed, but only abnormal results are displayed)  Labs Reviewed  COMPREHENSIVE METABOLIC PANEL -  Abnormal; Notable for the following components:      Result Value   Glucose, Bld 123 (*)    BUN 31 (*)    Creatinine, Ser 2.37 (*)    Calcium 8.8 (*)    GFR calc non Af Amer 31 (*)    GFR calc Af Amer 36 (*)    All other components within normal limits  CBC WITH DIFFERENTIAL/PLATELET - Abnormal; Notable for the following components:   RBC 4.09 (*)    Hemoglobin 11.5 (*)    HCT 38.4 (*)    MCHC 29.9 (*)    All other components within normal limits  CBC WITH DIFFERENTIAL/PLATELET  I-STAT TROPONIN, ED  I-STAT TROPONIN, ED   ____________________________________________  EKG   EKG Interpretation  Date/Time:  Sunday November 21 2017 16:18:13 EST Ventricular Rate:  50 PR Interval:    QRS Duration: 96 QT Interval:  466 QTC Calculation: 425 R Axis:   96 Text Interpretation:  Sinus rhythm Borderline prolonged PR interval Borderline right axis deviation Borderline low voltage, extremity leads Anteroseptal infarct, old Baseline wander in lead(s) V2 No STEMI.  Confirmed by Nanda Quinton [phone]) on 11/21/2017 4:22:34 PM       ____________________________________________  RADIOLOGY  Ct Head Wo Contrast  Result Date: 11/21/2017 CLINICAL DATA:  Unwitnessed fall. The patient does not remember the fall and whether or not he lost consciousness. EXAM: CT HEAD WITHOUT CONTRAST CT CERVICAL SPINE WITHOUT CONTRAST TECHNIQUE: Multidetector CT imaging of the head and cervical spine was performed following the standard protocol without intravenous contrast. Multiplanar CT image reconstructions of the cervical spine were also generated. COMPARISON:  Brain MR dated 10/10/2017 and head CT dated 10/09/2017. FINDINGS: CT HEAD FINDINGS Brain: Stable moderately enlarged ventricles and subarachnoid spaces. Stable marked patchy white matter low density in both cerebral hemispheres. Stable old left thalamic lacunar infarct, right genu internal capsule lacunar infarct and right inferior thalamic lacunar infarct.  No intracranial hemorrhage, mass lesion or CT evidence of acute infarction. Vascular: No hyperdense vessel or unexpected calcification. Skull: Normal. Negative for fracture or focal lesion. Sinuses/Orbits: Unremarkable orbits. Right inferior maxillary sinus mucosal thickening with mild progression. Other: None. CT CERVICAL SPINE FINDINGS Alignment: Mild reversal of the normal cervical lordosis. Minimal levoconvex cervical scoliosis. No subluxations. Skull base and vertebrae: No acute fracture. No primary bone lesion or focal pathologic process. Soft tissues and spinal canal: No prevertebral fluid or swelling. No visible canal hematoma. Disc levels: Mild anterior spur formation at the C6-7 level. Mild bilateral uncinate spur formation at the C2-3 level. Minimal bilateral uncinate spur formation at the C5-6 level. Upper chest: Partially included right lung apex on the last image with no visible abnormality. Other: None. IMPRESSION: 1. No skull fracture or intracranial hemorrhage. 2. No cervical spine fracture or subluxation. 3. Stable moderate diffuse cerebral and cerebellar atrophy. 4. Stable marked chronic small vessel white matter ischemic changes in both cerebral hemispheres. 5. Stable old bilateral cerebral Conor infarcts. 6. Mild cervical spine degenerative changes. Electronically Signed   By: Claudie Revering [name]   On: 11/21/2017  18:01   Ct Cervical Spine Wo Contrast  Result Date: 11/21/2017 CLINICAL DATA:  Unwitnessed fall. The patient does not remember the fall and whether or not he lost consciousness. EXAM: CT HEAD WITHOUT CONTRAST CT CERVICAL SPINE WITHOUT CONTRAST TECHNIQUE: Multidetector CT imaging of the head and cervical spine was performed following the standard protocol without intravenous contrast. Multiplanar CT image reconstructions of the cervical spine were also generated. COMPARISON:  Brain MR dated 10/10/2017 and head CT dated 10/09/2017. FINDINGS: CT HEAD FINDINGS Brain: Stable moderately  enlarged ventricles and subarachnoid spaces. Stable marked patchy white matter low density in both cerebral hemispheres. Stable old left thalamic lacunar infarct, right genu internal capsule lacunar infarct and right inferior thalamic lacunar infarct. No intracranial hemorrhage, mass lesion or CT evidence of acute infarction. Vascular: No hyperdense vessel or unexpected calcification. Skull: Normal. Negative for fracture or focal lesion. Sinuses/Orbits: Unremarkable orbits. Right inferior maxillary sinus mucosal thickening with mild progression. Other: None. CT CERVICAL SPINE FINDINGS Alignment: Mild reversal of the normal cervical lordosis. Minimal levoconvex cervical scoliosis. No subluxations. Skull base and vertebrae: No acute fracture. No primary bone lesion or focal pathologic process. Soft tissues and spinal canal: No prevertebral fluid or swelling. No visible canal hematoma. Disc levels: Mild anterior spur formation at the C6-7 level. Mild bilateral uncinate spur formation at the C2-3 level. Minimal bilateral uncinate spur formation at the C5-6 level. Upper chest: Partially included right lung apex on the last image with no visible abnormality. Other: None. IMPRESSION: 1. No skull fracture or intracranial hemorrhage. 2. No cervical spine fracture or subluxation. 3. Stable moderate diffuse cerebral and cerebellar atrophy. 4. Stable marked chronic small vessel white matter ischemic changes in both cerebral hemispheres. 5. Stable old bilateral cerebral Conor infarcts. 6. Mild cervical spine degenerative changes. Electronically Signed   By: Claudie Revering [name]   On: 11/21/2017 18:01    ____________________________________________   PROCEDURES  Procedure(s) performed:   Procedures  None ____________________________________________   INITIAL IMPRESSION / ASSESSMENT AND PLAN / ED COURSE  Pertinent labs & imaging results that were available during my care of the patient were reviewed by me and  considered in my medical decision making (see chart for details).  Patient presents to the emergency department for evaluation of unwitnessed fall.  Unknown if this represents mechanical fall or syncope.  Patient has been seen multiple times in the emergency department with falling and altered mental status.  In discussion with EMS and review of prior ED notes the patient appears to be at his mental status baseline.  I do not appreciate any focal neurologic deficits.  Given the unwitnessed nature of the fall and the patient's limited ability to describe the events I do plan for lab work as well as CT imaging of the head and cervical spine.   CT imaging reviewed. Labs with no acute findings. Troponin x 2 is normal. Patient with known bradycardia. Spoke with patient regarding results and cleared c-spine. Also spoke with the patient's daughter to update her. Patient has Cardiology follow up in December. At this time, plan for discharge back to nursing facility. Discussed f/u plan and return precautions with the patient and daughter by phone.  ____________________________________________  FINAL CLINICAL IMPRESSION(S) / ED DIAGNOSES  Final diagnoses:  Fall, initial encounter  Near syncope     MEDICATIONS GIVEN DURING THIS VISIT:  Medications  sodium chloride 0.9 % bolus 500 mL (0 mLs Intravenous Stopped 11/21/17 1725)     Note:  This document was prepared  using Systems analyst and may include unintentional dictation errors.  Nanda Quinton, MD Emergency Medicine    , [facility], MD 11/22/17 [phone]

## 2017-11-21 NOTE — ED Notes (Signed)
Patient Alert and oriented to baseline. Stable and ambulatory to baseline. Patient verbalized understanding of the discharge instructions.  Patient belongings were taken by the patient. Pt transported back to [facility] by [facility]

## 2017-11-21 NOTE — Discharge Instructions (Signed)
You have been seen today in the Emergency Department (ED)  for almost syncope (passing out).  Your workup including labs and EKG show reassuring results.  Your symptoms may be due to dehydration, so it is important that you drink plenty of non-alcoholic fluids.  Please call your regular doctor as soon as possible to schedule the next available clinic appointment to follow up with him/her regarding your visit to the ED and your symptoms.  Return to the Emergency Department (ED)  if you have any further syncopal episodes (pass out again) or develop ANY chest pain, pressure, tightness, trouble breathing, sudden sweating, or other symptoms that concern you.

## 2017-11-21 NOTE — ED Triage Notes (Signed)
Per [facility], pt from [facility] with a c/o a fall with an unknown LOC. The fall was not witnessed by staff. No seizure like activity reported. The pt does not remember the fall and cannot report whether or not he lost consciousness. Hx of stroke. Unknown baseline mental status. Unknown pre-existing deficits. Unknown v/s. Pt alert to self and location. Not alert to event or time. Stroke scale negative.

## 2018-01-04 NOTE — Progress Notes (Addendum)
NEUROLOGY CONSULTATION NOTE  STANCIL DEISHER MRN: [id number] DOB: 01/08/1970  Referring provider: Daisy Lazar, PA-C Primary care provider: Daisy Lazar, PA-C  Reason for consult:  Cerebral hemorrhage, altered mental status  HISTORY OF PRESENT ILLNESS: Adam Vincent is a 47 year old male with hypertension, diabetes mellitus, CKD, hyperlipidemia and history of right sided Bell's palsy in 2012 who presents for recurrent transient altered awareness in setting of history of right thalamic intracerebral hemorrhage.  He is accompanied by his daughter who supplements history.  History also supplemented by hospital, prior neurologist, and referring provider notes.  He had a hypertensive right thalamic intracerebral hemorrhage in January 2018. He had woke up with severe headache and left sided arm and leg weakness. He subsequently had bilateral watershed infarcts possibly due to strict blood pressure control and hypoxia and hypercapnea from OSA.  He was subsequently transferred to SNF and discharged in June 2018.    Following the stroke, his left sided weakness resolved.  However, he continues to have cognitive deficits.  He is often incontinent.  He has good days and bad days.  On bad days, he is more lethargic and short term memory is worse.  He was evaluated in the ED on 10/09/17 after walking out of his room in his underwear and appeared confused.  MRI of brain revealed no acute findings.  CBC, CMP, UA and urine drug screen were negative.  He has also had several episodes of falls in setting of syncope.  He states he feels lightheaded and passes out.  When witnessed, there is convulsing, tongue biting or foaming at the mouth.  He is unconscious for a couple of minutes and there is no postictal confusion.  On some occasions, he has been found to be orthostatic.  He has been evaluated by cardiology with negative testing.  He returned to the ED on 10/16/17 following a possible syncopal event.  He felt  lightheaded but it was uncertain if he just fell or lost consciousness.  It was unwitnessed.  He was found on the floor appearing weak and diaphoretic.  Labs, including troponin, and EKG were unremarkable.  He currently lives in a facility.  Imaging (personally reviewed): 02/03/16 CT head: Intraparenchymal hemorrhage in the right thalamus with surrounding white matter edema and mild dilatation of lateral ventricles. 02/04/2016 MRA head: Unremarkable 02/04/2016 MRI brain: Evolving right thalamic hematoma with effaced third ventricle and mild acute hydrocephalus as well as bilateral subcentimeter acute watershed territory infarcts 02/20/2016 MRI brain: Expected evolution of subacute hematoma in right thalamus.  Decreased vasogenic edema and ventriculomegaly. 05/25/17 CT head: Atrophy with small vessel ischemic changes and encephalomalacia in the right thalamus but no acute intracranial abnormality. 10/09/2017 CT head: No acute intracranial abnormality 10/09/2017 MRI brain: Advanced atrophy and chronic small vessel ischemic changes with several small chronic infarcts increased compared to prior MRI from 2018, but no acute intracranial abnormality. 11/21/2017 CT head: Stable diffuse cerebral and cerebellar atrophy, chronic small vessel ischemic changes with old bilateral thalamic lacunar infarcts but no acute intracranial abnormality.  07/19/17:  Hgb A1c 5.0 12/25/16:  LDL 63  PAST MEDICAL HISTORY: Past Medical History:  Diagnosis Date  . Diabetes mellitus   . Hypertension   . Stroke [facility])     PAST SURGICAL HISTORY: Past Surgical History:  Procedure Laterality Date  . ABSCESS DRAINAGE     Left leg    MEDICATIONS: Current Outpatient Medications on File Prior to Visit  Medication Sig Dispense Refill  . amLODipine (NORVASC) 10 MG tablet  Take 1 tablet (10 mg total) by mouth daily.    Marland Kitchen atorvastatin (LIPITOR) 10 MG tablet Take 1 tablet (10 mg total) by mouth daily at 6 PM. (Patient taking  differently: Take 10 mg by mouth daily. )    . Cholecalciferol (VITAMIN D3) 2000 units TABS Take 2,000 Units by mouth daily.     Marland Kitchen labetalol (NORMODYNE) 100 MG tablet Take 1 tablet (100 mg total) by mouth 3 (three) times daily for 14 days. (Patient not taking: Reported on 10/09/2017) 42 tablet 0  . metoprolol succinate (TOPROL-XL) 50 MG 24 hr tablet Take 50 mg by mouth daily.  2  . pyridoxine (B-6) 100 MG tablet Take 100 mg by mouth daily.    . sertraline (ZOLOFT) 50 MG tablet Take 1 tablet (50 mg total) by mouth daily.    Marland Kitchen triamcinolone cream (KENALOG) 0.1 % Apply 1 application topically daily as needed (to affected areas for itching).      No current facility-administered medications on file prior to visit.     ALLERGIES: Allergies  Allergen Reactions  . Coconut Flavor Rash    FAMILY HISTORY: Family History  Problem Relation Age of Onset  . Cataracts Maternal Grandfather   . Amblyopia Neg Hx   . Blindness Neg Hx   . Diabetes Neg Hx   . Glaucoma Neg Hx   . Macular degeneration Neg Hx   . Retinal detachment Neg Hx   . Strabismus Neg Hx   . Retinitis pigmentosa Neg Hx    SOCIAL HISTORY: Social History   Socioeconomic History  . Marital status: Married    Spouse name: Not on file  . Number of children: Not on file  . Years of education: Not on file  . Highest education level: Not on file  Occupational History  . Not on file  Social Needs  . Financial resource strain: Not on file  . Food insecurity:    Worry: Not on file    Inability: Not on file  . Transportation needs:    Medical: Not on file    Non-medical: Not on file  Tobacco Use  . Smoking status: Never Smoker  . Smokeless tobacco: Never Used  Substance and Sexual Activity  . Alcohol use: No  . Drug use: Yes    Types: Marijuana  . Sexual activity: Not on file  Lifestyle  . Physical activity:    Days per week: Not on file    Minutes per session: Not on file  . Stress: Not on file  Relationships  .  Social connections:    Talks on phone: Not on file    Gets together: Not on file    Attends religious service: Not on file    Active member of club or organization: Not on file    Attends meetings of clubs or organizations: Not on file    Relationship status: Not on file  . Intimate partner violence:    Fear of current or ex partner: Not on file    Emotionally abused: Not on file    Physically abused: Not on file    Forced sexual activity: Not on file  Other Topics Concern  . Not on file  Social History Narrative   Living home with mother.  Children 4.     REVIEW OF SYSTEMS: Constitutional: No fevers, chills, or sweats, no generalized fatigue, change in appetite Eyes: No visual changes, double vision, eye pain Ear, nose and throat: No hearing loss, ear pain, nasal congestion,  sore throat Cardiovascular: No chest pain, palpitations Respiratory:  No shortness of breath at rest or with exertion, wheezes GastrointestinaI: No nausea, vomiting, diarrhea, abdominal pain Genitourinary:  incontinent Musculoskeletal:  No neck pain, back pain Integumentary: No rash, pruritus, skin lesions Neurological: as above Psychiatric: No depression, insomnia, anxiety Endocrine: No palpitations, fatigue, diaphoresis, mood swings, change in appetite, change in weight, increased thirst Hematologic/Lymphatic:  No purpura, petechiae. Allergic/Immunologic: no itchy/runny eyes, nasal congestion, recent allergic reactions, rashes  PHYSICAL EXAM: Blood pressure 92/84, pulse (!) 59, height 6\' 1"  (1.854 m), weight 247 lb (112 kg), SpO2 92 %. General: No acute distress.    Head:  Normocephalic/atraumatic Eyes:  fundi examined but not visualized Neck: supple, no paraspinal tenderness, full range of motion Back: No paraspinal tenderness Heart: regular rate and rhythm Lungs: Clear to auscultation bilaterally. Vascular: No carotid bruits. Neurological Exam: Mental status: alert and oriented to person, place,   And month but not day, date or year; recent memory poor, remote memory intact, fund of knowledge reduced, attention and concentration reduced, speech fluent and not dysarthric, naming fluency impaired but otherwise language intact.  Able to complete Trail Making Test and copy a cube.  Did not correctly place numbers or hands on a clock. Montreal Cognitive Assessment  01/06/2018  Visuospatial/ Executive (0/5) 2  Naming (0/3) 3  Attention: Read list of digits (0/2) 1  Attention: Read list of letters (0/1) 1  Attention: Serial 7 subtraction starting at 100 (0/3) 0  Language: Repeat phrase (0/2) 2  Language : Fluency (0/1) 0  Abstraction (0/2) 2  Delayed Recall (0/5) 0  Orientation (0/6) 3  Total 14  Adjusted Score (based on education) 15   Cranial nerves: CN I: not tested CN II: pupils equal, round and reactive to light, visual fields intact CN III, IV, VI:  full range of motion, no nystagmus, no ptosis CN V: facial sensation intact CN VII: right facial weakness CN VIII: hearing intact CN IX, X: gag intact, uvula midline CN XI: sternocleidomastoid and trapezius muscles intact CN XII: tongue midline Bulk & Tone: normal, no fasciculations. Motor:  5/5 throughout  Sensation:  Light-touch sensation intact. . Deep Tendon Reflexes:  2+ throughout, toes downgoing. Finger to nose testing:  Without dysmetria.  Gait:  Short-stride. Romberg negative.  IMPRESSION: 1.  History of right thalamic hemorrhage and bilateral watershed infarcts 2.  Vascular dementia 3.  Recurrent syncope.  Possibly related to orthostatic hypotension as reportedly recorded at times in the past. However, I would like to check for potential basilar artery stenosis 4.  HTN 5.  OSA 6.  Type 2 diabetes mellitus 7.  Hyperlipidemia  PLAN: 1.  Will refer to sleep medicine for evaluation and possible treatment of OSA 2.  Will check CTA of head to evaluate for possible basilar artery stenosis/insufficiency as possible cause  of recurrent syncope. 3.  Continue atorvastatin.  LDL at goal of less than 70 4.  Maintain continued blood pressure and glycemic control 5.  Encouraged to remain socially active at his facility. 6.  He has upcoming appointment with another cardiologist for second opinion regarding syncope. 7.  Follow up in 6 months.  Thank you for allowing me to take part in the care of this patient.  Metta Clines, DO  CC:  Daisy Lazar, PA-C

## 2018-01-06 ENCOUNTER — Ambulatory Visit: Payer: Medicaid Other | Admitting: Neurology

## 2018-01-06 ENCOUNTER — Encounter: Payer: Self-pay | Admitting: Neurology

## 2018-01-06 VITALS — BP 92/84 | HR 59 | Ht 73.0 in | Wt 247.0 lb

## 2018-01-06 DIAGNOSIS — R55 Syncope and collapse: Secondary | ICD-10-CM | POA: Diagnosis not present

## 2018-01-06 DIAGNOSIS — I6389 Other cerebral infarction: Secondary | ICD-10-CM | POA: Diagnosis not present

## 2018-01-06 DIAGNOSIS — I10 Essential (primary) hypertension: Secondary | ICD-10-CM

## 2018-01-06 DIAGNOSIS — G4733 Obstructive sleep apnea (adult) (pediatric): Secondary | ICD-10-CM

## 2018-01-06 DIAGNOSIS — I610 Nontraumatic intracerebral hemorrhage in hemisphere, subcortical: Secondary | ICD-10-CM | POA: Diagnosis not present

## 2018-01-06 DIAGNOSIS — E785 Hyperlipidemia, unspecified: Secondary | ICD-10-CM

## 2018-01-06 DIAGNOSIS — F0150 Vascular dementia without behavioral disturbance: Secondary | ICD-10-CM | POA: Diagnosis not present

## 2018-01-06 NOTE — Patient Instructions (Addendum)
1.  We will refer you to sleep medicine to evaluate for sleep apnea 2.  Continue current medications for now. 3.  Try to remain socially interactive.  Participate in activities and using the gym 4.  We will check CTA of head. 5.  Follow up in 6 months.  We have sent a referral to [facility] for your CTA and they will call you directly to schedule your appt. They are located at [place]. If you need to contact them directly please call [phone].

## 2018-01-07 ENCOUNTER — Encounter: Payer: Self-pay | Admitting: Neurology

## 2018-01-11 ENCOUNTER — Ambulatory Visit
Admission: RE | Admit: 2018-01-11 | Discharge: 2018-01-11 | Disposition: A | Discharge status: Home / Self Care | Payer: Medicaid Other | Source: Ambulatory Visit | Attending: Neurology | Admitting: Neurology

## 2018-01-11 DIAGNOSIS — I610 Nontraumatic intracerebral hemorrhage in hemisphere, subcortical: Secondary | ICD-10-CM

## 2018-01-11 DIAGNOSIS — I6389 Other cerebral infarction: Secondary | ICD-10-CM

## 2018-01-11 DIAGNOSIS — R55 Syncope and collapse: Secondary | ICD-10-CM

## 2018-01-20 ENCOUNTER — Other Ambulatory Visit: Payer: Self-pay

## 2018-01-20 DIAGNOSIS — I610 Nontraumatic intracerebral hemorrhage in hemisphere, subcortical: Secondary | ICD-10-CM

## 2018-01-20 NOTE — Progress Notes (Signed)
mra

## 2018-02-02 ENCOUNTER — Other Ambulatory Visit: Payer: Medicaid Other

## 2018-03-31 ENCOUNTER — Institutional Professional Consult (permissible substitution): Payer: Medicaid Other | Admitting: Pulmonary Disease

## 2018-07-07 ENCOUNTER — Ambulatory Visit: Payer: Medicaid Other | Admitting: Neurology

## 2019-04-16 ENCOUNTER — Emergency Department (HOSPITAL_COMMUNITY): Payer: Medicaid Other

## 2019-04-16 ENCOUNTER — Other Ambulatory Visit: Payer: Self-pay

## 2019-04-16 ENCOUNTER — Emergency Department (HOSPITAL_COMMUNITY)
Admission: EM | Admit: 2019-04-16 | Discharge: 2019-04-16 | Disposition: A | Discharge status: Home / Self Care | Payer: Medicaid Other | Attending: Emergency Medicine | Admitting: Emergency Medicine

## 2019-04-16 ENCOUNTER — Encounter (HOSPITAL_COMMUNITY): Payer: Self-pay | Admitting: *Deleted

## 2019-04-16 DIAGNOSIS — I129 Hypertensive chronic kidney disease with stage 1 through stage 4 chronic kidney disease, or unspecified chronic kidney disease: Secondary | ICD-10-CM | POA: Insufficient documentation

## 2019-04-16 DIAGNOSIS — N183 Chronic kidney disease, stage 3 unspecified: Secondary | ICD-10-CM | POA: Insufficient documentation

## 2019-04-16 DIAGNOSIS — E1122 Type 2 diabetes mellitus with diabetic chronic kidney disease: Secondary | ICD-10-CM | POA: Diagnosis not present

## 2019-04-16 DIAGNOSIS — W1789XA Other fall from one level to another, initial encounter: Secondary | ICD-10-CM | POA: Insufficient documentation

## 2019-04-16 DIAGNOSIS — Z79899 Other long term (current) drug therapy: Secondary | ICD-10-CM | POA: Diagnosis not present

## 2019-04-16 DIAGNOSIS — I951 Orthostatic hypotension: Secondary | ICD-10-CM | POA: Insufficient documentation

## 2019-04-16 DIAGNOSIS — S0101XA Laceration without foreign body of scalp, initial encounter: Secondary | ICD-10-CM | POA: Diagnosis not present

## 2019-04-16 DIAGNOSIS — Y9389 Activity, other specified: Secondary | ICD-10-CM | POA: Insufficient documentation

## 2019-04-16 DIAGNOSIS — R001 Bradycardia, unspecified: Secondary | ICD-10-CM | POA: Diagnosis not present

## 2019-04-16 DIAGNOSIS — G3184 Mild cognitive impairment, so stated: Secondary | ICD-10-CM | POA: Insufficient documentation

## 2019-04-16 DIAGNOSIS — Y92128 Other place in nursing home as the place of occurrence of the external cause: Secondary | ICD-10-CM | POA: Diagnosis not present

## 2019-04-16 DIAGNOSIS — Y999 Unspecified external cause status: Secondary | ICD-10-CM | POA: Diagnosis not present

## 2019-04-16 DIAGNOSIS — R4182 Altered mental status, unspecified: Secondary | ICD-10-CM | POA: Diagnosis present

## 2019-04-16 HISTORY — DX: Disorder of kidney and ureter, unspecified: N28.9

## 2019-04-16 HISTORY — DX: Depression, unspecified: F32.A

## 2019-04-16 HISTORY — DX: Peripheral vascular disease, unspecified: I73.9

## 2019-04-16 HISTORY — DX: Mild cognitive impairment of uncertain or unknown etiology: G31.84

## 2019-04-16 LAB — CBC WITH DIFFERENTIAL/PLATELET
Abs Immature Granulocytes: 0.02 10*3/uL (ref 0.00–0.07)
Basophils Absolute: 0.1 10*3/uL (ref 0.0–0.1)
Basophils Relative: 1 %
Eosinophils Absolute: 0 10*3/uL (ref 0.0–0.5)
Eosinophils Relative: 0 %
HCT: 48.1 % (ref 39.0–52.0)
Hemoglobin: 15 g/dL (ref 13.0–17.0)
Immature Granulocytes: 0 %
Lymphocytes Relative: 20 %
Lymphs Abs: 1.4 10*3/uL (ref 0.7–4.0)
MCH: 29.5 pg (ref 26.0–34.0)
MCHC: 31.2 g/dL (ref 30.0–36.0)
MCV: 94.5 fL (ref 80.0–100.0)
Monocytes Absolute: 0.4 10*3/uL (ref 0.1–1.0)
Monocytes Relative: 6 %
Neutro Abs: 5.2 10*3/uL (ref 1.7–7.7)
Neutrophils Relative %: 73 %
Platelets: 224 10*3/uL (ref 150–400)
RBC: 5.09 MIL/uL (ref 4.22–5.81)
RDW: 13.5 % (ref 11.5–15.5)
WBC: 7.1 10*3/uL (ref 4.0–10.5)
nRBC: 0 % (ref 0.0–0.2)

## 2019-04-16 LAB — URINALYSIS, ROUTINE W REFLEX MICROSCOPIC
Bilirubin Urine: NEGATIVE
Glucose, UA: NEGATIVE mg/dL
Hgb urine dipstick: NEGATIVE
Ketones, ur: NEGATIVE mg/dL
Leukocytes,Ua: NEGATIVE
Nitrite: NEGATIVE
Protein, ur: NEGATIVE mg/dL
Specific Gravity, Urine: 1.01 (ref 1.005–1.030)
pH: 8 (ref 5.0–8.0)

## 2019-04-16 LAB — CBG MONITORING, ED: Glucose-Capillary: 106 mg/dL — ABNORMAL HIGH (ref 70–99)

## 2019-04-16 LAB — BRAIN NATRIURETIC PEPTIDE: B Natriuretic Peptide: 54.5 pg/mL (ref 0.0–100.0)

## 2019-04-16 MED ORDER — LIDOCAINE-EPINEPHRINE (PF) 2 %-1:200000 IJ SOLN
10.0000 mL | Freq: Once | INTRAMUSCULAR | Status: DC
  Filled 2019-04-16: qty 20

## 2019-04-16 MED ORDER — SODIUM CHLORIDE 0.9 % IV BOLUS
1000.0000 mL | Freq: Once | INTRAVENOUS | Status: AC
  Administered 2019-04-16: 1000 mL via INTRAVENOUS

## 2019-04-16 MED ORDER — SODIUM CHLORIDE 0.9 % IV BOLUS
1000.0000 mL | Freq: Once | INTRAVENOUS | Status: AC
  Administered 2019-04-16: 13:00:00 1000 mL via INTRAVENOUS

## 2019-04-16 MED ORDER — HYDRALAZINE HCL 50 MG PO TABS: 50.0000 mg | ORAL_TABLET | Freq: Three times a day (TID) | ORAL | 0 refills | Status: AC

## 2019-04-16 NOTE — ED Notes (Signed)
Patient daughter calling asking for a quick Update Adam Vincent [phone]

## 2019-04-16 NOTE — ED Provider Notes (Signed)
Signed out by Dr Gilford Raid that pt getting ivf, and ua pending, to d/c to home when done.   UA neg for infection. Patient received ivf.   No faintness or dizziness. No current pain or discomfort.   Patient currently appears stable for d/c.   rec pcp f/u.  Return precautions provided.     Lajean Saver, MD 04/16/19 [phone]

## 2019-04-16 NOTE — ED Provider Notes (Addendum)
[place] EMERGENCY DEPARTMENT Provider Note   CSN: [id number] Arrival date & time: 04/16/19  1124     History Chief Complaint  Patient presents with  . Fall  . Altered Mental Status    Adam Vincent is a 49 y.o. male.  Pt presents to the ED today with a fall.  Pt lives at [facility] center and the staff were helping him stand up.  Pt stood up and fell.  Pt did not have a loc, but did not speak to the staff after the fall.  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  He has some cognitive deficits and is often incontinent.  Pt has a hx of several episodes of syncope with transient alteration of awareness.  He has been evaluated by cards and by neurology. Pt did sustain a very small lac to the back of his head.  Pt has an O2 sat documented as 76% on RA on the chart.  This is an error.  O2 sats are in the upper 90s on RA.           Past Medical History:  Diagnosis Date  . Depression   . Diabetes mellitus   . Hypertension   . Mild cognitive impairment   . Peripheral vascular disease ([place])   . Renal disorder    chronic kidney disease  . Stroke [facility])     Patient Active Problem List   Diagnosis Date Noted  . Acute renal failure ([place]) 07/03/2016  . Normocytic anemia 07/03/2016  . ICH (intracerebral hemorrhage) ([facility]) - R thalamic d/t HTN emergency 02/26/2016  . Depression 02/26/2016  . OSA (obstructive sleep apnea) 02/26/2016  . Hyperlipidemia 02/26/2016  . Hydrocephalus ([place])   . Cerebrovascular accident (CVA) ([place]) - B watershed d/t strict BP control   . Morbid obesity ([place])   . Marijuana abuse   . Benign essential HTN   . Diabetes mellitus type 2 in obese ([place])   . Fever   . Acute blood loss anemia   . Dysphagia   . Stage 3 chronic kidney disease   . Lethargy   . Altered mental state   . Uncontrolled hypertension     Past Surgical History:  Procedure  Laterality Date  . ABSCESS DRAINAGE     Left leg       Family History  Problem Relation Age of Onset  . Cataracts Maternal Grandfather   . Hypertension Mother   . COPD Mother   . Prostate cancer Father   . Amblyopia Neg Hx   . Blindness Neg Hx   . Diabetes Neg Hx   . Glaucoma Neg Hx   . Macular degeneration Neg Hx   . Retinal detachment Neg Hx   . Strabismus Neg Hx   . Retinitis pigmentosa Neg Hx     Social History   Tobacco Use  . Smoking status: Never Smoker  . Smokeless tobacco: Never Used  Substance Use Topics  . Alcohol use: No  . Drug use: Yes    Types: Marijuana    Home Medications Prior to Admission medications   Medication Sig Start Date End Date Taking? Authorizing Provider  amLODipine (NORVASC) 10 MG tablet Take 1 tablet (10 mg total) by mouth daily. 02/27/16   Donzetta Starch, NP  atorvastatin (LIPITOR) 10 MG tablet Take 1 tablet (10 mg total) by mouth daily at 6 PM. Patient taking  differently: Take 10 mg by mouth daily.  02/26/16   Donzetta Starch, NP  Cholecalciferol (VITAMIN D3) 2000 units TABS Take 2,000 Units by mouth daily.     [provider]  hydrALAZINE (APRESOLINE) 50 MG tablet Take 1 tablet (50 mg total) by mouth 3 (three) times daily. 04/16/19   Isla Pence, MD  labetalol (NORMODYNE) 100 MG tablet Take 1 tablet (100 mg total) by mouth 3 (three) times daily for 14 days. Patient not taking: Reported on 10/09/2017 05/25/17 06/08/17  Duffy Bruce, MD  pyridoxine (B-6) 100 MG tablet Take 100 mg by mouth daily.    [provider]  sertraline (ZOLOFT) 50 MG tablet Take 1 tablet (50 mg total) by mouth daily. 02/27/16   Donzetta Starch, NP  triamcinolone cream (KENALOG) 0.1 % Apply 1 application topically daily as needed (to affected areas for itching).     [provider]  metoprolol succinate (TOPROL-XL) 50 MG 24 hr tablet Take 50 mg by mouth daily. 08/30/17 04/16/19  [provider]    Allergies    Coconut  flavor  Review of Systems   Review of Systems  Skin: Positive for wound.  All other systems reviewed and are negative.   Physical Exam Updated Vital Signs BP (!) 147/91 (BP Location: Right Arm)   Pulse (!) 53   Temp (!) 97.5 F (36.4 C) (Oral)   Resp (!) 0   Ht 6\' 1"  (1.854 m)   Wt 112 kg   SpO2 (!) 76%   BMI 32.58 kg/m   Physical Exam Vitals and nursing note reviewed.  Constitutional:      Appearance: Normal appearance.  HENT:     Head: Normocephalic.     Right Ear: External ear normal.     Left Ear: External ear normal.     Nose: Nose normal.     Mouth/Throat:     Mouth: Mucous membranes are moist.     Pharynx: Oropharynx is clear.  Eyes:     Extraocular Movements: Extraocular movements intact.     Conjunctiva/sclera: Conjunctivae normal.     Pupils: Pupils are equal, round, and reactive to light.  Neck:     Comments: In c-collar Cardiovascular:     Rate and Rhythm: Bradycardia present.     Pulses: Normal pulses.     Heart sounds: Normal heart sounds.  Pulmonary:     Effort: Pulmonary effort is normal.     Breath sounds: Normal breath sounds.  Abdominal:     General: Abdomen is flat. Bowel sounds are normal.     Palpations: Abdomen is soft.  Musculoskeletal:        General: Normal range of motion.  Skin:    General: Skin is warm.     Capillary Refill: Capillary refill takes less than 2 seconds.  Neurological:     Mental Status: He is alert.     Comments: Pt able to tell me his name, but he is otherwise disoriented.  Psychiatric:        Mood and Affect: Mood normal.        Behavior: Behavior normal.     ED Results / Procedures / Treatments   Labs (all labs ordered are listed, but only abnormal results are displayed) Labs Reviewed  CBG MONITORING, ED - Abnormal; Notable for the following components:      Result Value   Glucose-Capillary 106 (*)    All other components within normal limits  CBC WITH DIFFERENTIAL/PLATELET  BRAIN NATRIURETIC  PEPTIDE  URINALYSIS, ROUTINE W REFLEX MICROSCOPIC    EKG EKG Interpretation  Date/Time:  Sunday April 16 2019 16:03:28 EDT Ventricular Rate:  56 PR Interval:    QRS Duration: 97 QT Interval:  442 QTC Calculation: 427 R Axis:   93 Text Interpretation: Sinus rhythm Prolonged PR interval Borderline right axis deviation Borderline low voltage, extremity leads No significant change since last tracing Confirmed by Isla Pence [id number]) on 04/16/2019 4:05:08 PM   Radiology CT Head Wo Contrast  Result Date: 04/16/2019 CLINICAL DATA:  Fall, head trauma EXAM: CT HEAD WITHOUT CONTRAST TECHNIQUE: Contiguous axial images were obtained from the base of the skull through the vertex without intravenous contrast. COMPARISON:  10/09/2017 FINDINGS: Brain: No evidence of acute infarction, hemorrhage, hydrocephalus, extra-axial collection or mass lesion/mass effect. Moderate low-density changes within the periventricular and subcortical white matter compatible with chronic microvascular ischemic change. Mild diffuse cerebral volume loss. Vascular: No hyperdense vessel or unexpected calcification. Skull: Negative for calvarial fracture. Sinuses/Orbits: Paranasal sinuses and mastoid air cells are clear. Orbital structures unremarkable. Other: Mild superficial scalp swelling overlying the posterior parietal calvarium. IMPRESSION: 1. No acute intracranial findings. 2. Mild superficial scalp swelling overlying the posterior parietal calvarium. No underlying calvarial fracture. 3. Age advanced chronic microvascular ischemic changes and cerebral volume loss, similar to prior. Electronically Signed   By: Davina Poke D.O.   On: 04/16/2019 13:02   CT Cervical Spine Wo Contrast  Result Date: 04/16/2019 CLINICAL DATA:  Neck pain after fall EXAM: CT CERVICAL SPINE WITHOUT CONTRAST TECHNIQUE: Multidetector CT imaging of the cervical spine was performed without intravenous contrast. Multiplanar CT image reconstructions  were also generated. COMPARISON:  11/21/2017 FINDINGS: Alignment: Straightening with slight reversal of the cervical lordosis. Facet joints are aligned without dislocation. Dens and lateral masses are aligned. Skull base and vertebrae: No acute fracture. No primary bone lesion or focal pathologic process. Soft tissues and spinal canal: No prevertebral fluid or swelling. No visible canal hematoma. Disc levels: Intervertebral disc spaces are relatively preserved. Minimal endplate/uncovertebral spurring, similar to prior. No evidence of high-grade foraminal or canal stenosis by CT. Upper chest: Visualized lung apices clear. Other: None. IMPRESSION: 1. No acute fracture or subluxation of the cervical spine. 2. Straightening with slight reversal of the cervical lordosis may be due to positioning or muscle spasm. Electronically Signed   By: Davina Poke D.O.   On: 04/16/2019 13:06    Procedures .Marland KitchenLaceration Repair  Date/Time: 04/16/2019 1:32 PM Performed by: Isla Pence, MD Authorized by: Isla Pence, MD   Consent:    Consent obtained:  Verbal   Consent given by:  Patient   Alternatives discussed:  No treatment Anesthesia (see MAR for exact dosages):    Anesthesia method:  None Laceration details:    Location:  Scalp   Scalp location:  Occipital   Length (cm):  0.1 Repair type:    Repair type:  Simple Treatment:    Area cleansed with:  Saline   Amount of cleaning:  Standard Skin repair:    Repair method:  Tissue adhesive Post-procedure details:    Patient tolerance of procedure:  Tolerated well, no immediate complications   (including critical care time)  Medications Ordered in ED Medications  sodium chloride 0.9 % bolus 1,000 mL (has no administration in time range)  sodium chloride 0.9 % bolus 1,000 mL (1,000 mLs Intravenous New Bag/Given 04/16/19 1242)  lidocaine-EPINEPHrine (XYLOCAINE W/EPI) 2 %-1:200000 (PF) injection 10 mL (10 mLs Infiltration Given 04/16/19 1241)  ED Course  I have reviewed the triage vital signs and the nursing notes.  Pertinent labs & imaging results that were available during my care of the patient were reviewed by me and considered in my medical decision making (see chart for details).    MDM Rules/Calculators/A&P                     Pt is significantly orthostatic after 500 cc NS.  He is given 2L fluid.    He is more alert and awake.  He is drinking Sprite.  Due to the orthostasis and the bradycardia, I will take him off Toprol and put him on hydralazine.  He is instructed to f/u with cards.  Final Clinical Impression(s) / ED Diagnoses Final diagnoses:  Orthostatic hypotension  Bradycardia    Rx / DC Orders ED Discharge Orders         Ordered    hydrALAZINE (APRESOLINE) 50 MG tablet  3 times daily     04/16/19 1513           Isla Pence, MD 04/16/19 1548    Isla Pence, MD 04/16/19 1605

## 2019-04-16 NOTE — ED Notes (Signed)
ptar called by dee 

## 2019-04-16 NOTE — ED Notes (Signed)
External catheter placed on the pt

## 2019-04-16 NOTE — ED Notes (Signed)
Attempted to call report, but staff did not answer phone.

## 2019-04-16 NOTE — ED Triage Notes (Addendum)
Pt here via [facility] from [facility].  Staff stated that they were helping him stand to get dressed when he fell, hitting his head.  No loc, but pt did not speak to staff after fall and had L sided deficits.  Hx of cognitive deficits from previous brain bleed.  L pupil 3 sluggish, R pupil 3 brisk.   HR in 40's w/ no hx of same.  Lac to back of head with bleeding controlled.

## 2019-04-16 NOTE — Discharge Instructions (Addendum)
It was our pleasure to provide your ER care today - we hope that you feel better.  Stop Toprol and start Hydralazine.  Follow up with primary care doctor in the next few days.  Return to ER if worse, new symptoms, fevers, new or severe pain, trouble breathing, weak/fainting, or other concern.

## 2019-04-16 NOTE — ED Notes (Signed)
MD notified of elevating bp.

## 2019-04-17 MED FILL — hydrALAZINE HCL 50 MG TABS: 50 | 10 days supply | Qty: 30 | Fill #0

## 2021-09-01 ENCOUNTER — Ambulatory Visit (INDEPENDENT_AMBULATORY_CARE_PROVIDER_SITE_OTHER): Payer: Medicaid Other | Admitting: Podiatry

## 2021-09-01 DIAGNOSIS — M79675 Pain in left toe(s): Secondary | ICD-10-CM

## 2021-09-01 DIAGNOSIS — B351 Tinea unguium: Secondary | ICD-10-CM

## 2021-09-01 DIAGNOSIS — E1169 Type 2 diabetes mellitus with other specified complication: Secondary | ICD-10-CM

## 2021-09-01 DIAGNOSIS — M79674 Pain in right toe(s): Secondary | ICD-10-CM

## 2021-09-01 NOTE — Progress Notes (Signed)
  Subjective:  Patient ID: Adam Vincent, male    DOB: 1970-09-27,  MRN: [id number]  Chief Complaint  Patient presents with   Nail Problem    DFC    51 y.o. male presents with the above complaint. History confirmed with patient.  Nails are thickened and elongated causing discomfort in shoes, he is here with family member.  Says his blood sugar has been good  Objective:  Physical Exam: warm, good capillary refill, no trophic changes or ulcerative lesions, normal DP and PT pulses, and normal sensory exam. Left Foot: dystrophic yellowed discolored nail plates with subungual debris Right Foot: dystrophic yellowed discolored nail plates with subungual debris  Assessment:   1. Pain due to onychomycosis of toenails of both feet   2. Diabetes mellitus type 2 in obese [facility])      Plan:  Patient was evaluated and treated and all questions answered.  Patient educated on diabetes. Discussed proper diabetic foot care and discussed risks and complications of disease. Educated patient in depth on reasons to return to the office immediately should he/she discover anything concerning or new on the feet. All questions answered. Discussed proper shoes as well.   Discussed the etiology and treatment options for the condition in detail with the patient. Educated patient on the topical and oral treatment options for mycotic nails. Recommended debridement of the nails today. Sharp and mechanical debridement performed of all painful and mycotic nails today. Nails debrided in length and thickness using a nail nipper to level of comfort. Discussed treatment options including appropriate shoe gear. Follow up as needed for painful nails.    Return in about 3 months (around 12/02/2021) for at risk diabetic foot care.

## 2021-12-01 ENCOUNTER — Ambulatory Visit: Payer: Medicaid Other | Admitting: Podiatry

## 2021-12-03 ENCOUNTER — Other Ambulatory Visit: Payer: Self-pay | Admitting: Nephrology

## 2021-12-03 DIAGNOSIS — N184 Chronic kidney disease, stage 4 (severe): Secondary | ICD-10-CM

## 2021-12-03 DIAGNOSIS — N2581 Secondary hyperparathyroidism of renal origin: Secondary | ICD-10-CM

## 2021-12-03 DIAGNOSIS — E785 Hyperlipidemia, unspecified: Secondary | ICD-10-CM

## 2021-12-03 DIAGNOSIS — N189 Chronic kidney disease, unspecified: Secondary | ICD-10-CM

## 2021-12-03 DIAGNOSIS — I120 Hypertensive chronic kidney disease with stage 5 chronic kidney disease or end stage renal disease: Secondary | ICD-10-CM

## 2021-12-18 ENCOUNTER — Ambulatory Visit
Admission: RE | Admit: 2021-12-18 | Discharge: 2021-12-18 | Disposition: A | Discharge status: Home / Self Care | Payer: Medicaid Other | Source: Ambulatory Visit | Attending: Nephrology | Admitting: Nephrology

## 2021-12-18 DIAGNOSIS — I120 Hypertensive chronic kidney disease with stage 5 chronic kidney disease or end stage renal disease: Secondary | ICD-10-CM

## 2021-12-18 DIAGNOSIS — D631 Anemia in chronic kidney disease: Secondary | ICD-10-CM

## 2021-12-18 DIAGNOSIS — N2581 Secondary hyperparathyroidism of renal origin: Secondary | ICD-10-CM

## 2021-12-18 DIAGNOSIS — E785 Hyperlipidemia, unspecified: Secondary | ICD-10-CM

## 2021-12-18 DIAGNOSIS — N184 Chronic kidney disease, stage 4 (severe): Secondary | ICD-10-CM

## 2022-01-09 ENCOUNTER — Ambulatory Visit: Payer: Medicaid Other | Admitting: Podiatry

## 2022-02-04 ENCOUNTER — Ambulatory Visit: Payer: Medicaid Other | Admitting: Podiatry

## 2022-02-18 ENCOUNTER — Ambulatory Visit: Payer: Medicaid Other | Admitting: Podiatry

## 2023-03-29 ENCOUNTER — Ambulatory Visit (INDEPENDENT_AMBULATORY_CARE_PROVIDER_SITE_OTHER): Admitting: Diagnostic Neuroimaging

## 2023-03-29 ENCOUNTER — Encounter: Payer: Self-pay | Admitting: Diagnostic Neuroimaging

## 2023-03-29 VITALS — BP 116/77 | HR 62 | Ht 73.0 in | Wt 235.0 lb

## 2023-03-29 DIAGNOSIS — I610 Nontraumatic intracerebral hemorrhage in hemisphere, subcortical: Secondary | ICD-10-CM | POA: Diagnosis not present

## 2023-03-29 DIAGNOSIS — R4689 Other symptoms and signs involving appearance and behavior: Secondary | ICD-10-CM | POA: Diagnosis not present

## 2023-03-29 MED ORDER — LEVETIRACETAM 500 MG PO TABS: 500.0000 mg | ORAL_TABLET | Freq: Two times a day (BID) | ORAL | Status: AC

## 2023-03-29 NOTE — Progress Notes (Signed)
 [facility]  PATIENT: Adam Vincent DOB: 10-22-1970  REFERRING CLINICIAN: Yevonne Pax, PA HISTORY FROM: patient, daughter, chart review REASON FOR VISIT: new consult     HISTORICAL  CHIEF COMPLAINT:  Chief Complaint  Patient presents with   New Patient (Initial Visit)    Patient in room #7 with his daughter. Patient states he here to discuss his involuntary movements or seizure like movement. Patient states he having numbness and weakness in both legs.    HISTORY OF PRESENT ILLNESS:   UPDATE (03/29/23, VRP): 53 year old male with history of right thalamic intracerebral hemorrhage.  Here for evaluation of abnormal spells.  For past 3 months has had several episodes of legs feeling numb, and staring off in space.  Episodes last about 1 minute.  He is having 2-3 episodes per week.  No convulsions.  No postictal confusion.  PRIOR HPI (6060): 53 year old male here for evaluation of right thalamic intracerebral hemorrhage. 02/03/16 patient woke up with severe headache. He noticed weakness in his left arm and left leg. Patient came to hospital for evaluation, was diagnosed with right thalamic intracerebral hemorrhage. Patient was admitted to neuro ICU for further evaluation and treatment. Patient was then transitioned to skilled nursing facility for rehabilitation.  Patient discharged from skilled nursing facility on 06/19/2016, now living back at home. Patient is doing well. He is able to walk up and down stairs independently. He uses a single-point cane. He is able to get out of bed by himself. He is able to take care of his personal hygiene needs for the most part with minor assistance. Recently he was able to repair a small meal including baked chicken. Patient having improved diabetes and blood pressure control. He is no longer on diabetic medications. Patient continues to have some memory loss, speech difficulty, dizziness. Left-sided weakness and numbness has  significant improved.  Of note patient reports history of right facial Bell's palsy in 2012.   REVIEW OF SYSTEMS: Full 14 system review of systems performed and negative with exception of: Memory loss.  ALLERGIES: Allergies  Allergen Reactions   Coconut Flavoring Agent (Non-Screening) Rash    HOME MEDICATIONS: Outpatient Medications Prior to Visit  Medication Sig Dispense Refill   amLODipine (NORVASC) 10 MG tablet Take 1 tablet (10 mg total) by mouth daily.     atorvastatin (LIPITOR) 10 MG tablet Take 1 tablet (10 mg total) by mouth daily at 6 PM. (Patient taking differently: Take 10 mg by mouth daily.)     buPROPion ER (WELLBUTRIN SR) 100 MG 12 hr tablet Take 100 mg by mouth 2 (two) times daily.     Cholecalciferol (VITAMIN D3) 2000 units TABS Take 2,000 Units by mouth daily.      folic acid (FOLVITE) 400 MCG tablet Take 400 mcg by mouth daily.     hydrALAZINE (APRESOLINE) 50 MG tablet Take 1 tablet (50 mg total) by mouth 3 (three) times daily. 30 tablet 0   lisinopril (ZESTRIL) 5 MG tablet Take 5 mg by mouth daily.     senna-docusate (SENOKOT-S) 8.6-50 MG tablet Take 1 tablet by mouth at bedtime.     sertraline (ZOLOFT) 50 MG tablet Take 1 tablet (50 mg total) by mouth daily. (Patient taking differently: Take 100 mg by mouth daily.)     triamcinolone cream (KENALOG) 0.1 % Apply 1 application topically daily as needed (to affected areas for itching).      pyridoxine (B-6) 100 MG tablet Take 100 mg by mouth daily. (Patient not  taking: Reported on 03/29/2023)     No facility-administered medications prior to visit.    PAST MEDICAL HISTORY: Past Medical History:  Diagnosis Date   Depression    Diabetes mellitus    Hypertension    Mild cognitive impairment    Peripheral vascular disease ([facility])    Renal disorder    chronic kidney disease   Stroke ([facility])     PAST SURGICAL HISTORY: Past Surgical History:  Procedure Laterality Date   ABSCESS DRAINAGE     Left leg    FAMILY  HISTORY: Family History  Problem Relation Age of Onset   Cataracts Maternal Grandfather    Hypertension Mother    COPD Mother    Prostate cancer Father    Amblyopia Neg Hx    Blindness Neg Hx    Diabetes Neg Hx    Glaucoma Neg Hx    Macular degeneration Neg Hx    Retinal detachment Neg Hx    Strabismus Neg Hx    Retinitis pigmentosa Neg Hx     SOCIAL HISTORY:  Social History   Socioeconomic History   Marital status: Single    Spouse name: Not on file   Number of children: 6   Years of education: Not on file   Highest education level: 12th grade  Occupational History   Occupation: disabled  Tobacco Use   Smoking status: Never   Smokeless tobacco: Never  Vaping Use   Vaping status: Never Used  Substance and Sexual Activity   Alcohol use: No   Drug use: Yes    Types: Marijuana   Sexual activity: Not on file  Other Topics Concern   Not on file  Social History Narrative   Living home with mother.  Children 4.       Patient is right-handed. He is currently living in the [facility] L[facility] facility.   Social Drivers of Corporate investment banker Strain: Not on file  Food Insecurity: Not on file  Transportation Needs: Not on file  Physical Activity: Not on file  Stress: Not on file  Social Connections: Not on file  Intimate Partner Violence: Not on file     PHYSICAL EXAM  GENERAL EXAM/CONSTITUTIONAL: Vitals:  Vitals:   03/29/23 1021  BP: 116/77  Pulse: 62  Weight: 235 lb (106.6 kg)  Height: 6\' 1"  (1.854 m)   Body mass index is 31 kg/m. No results found. Patient is in no distress; well developed, nourished and groomed; neck is supple  CARDIOVASCULAR: Examination of carotid arteries is normal; no carotid bruits Regular rate and rhythm, no murmurs Examination of peripheral vascular system by observation and palpation is normal  EYES: Ophthalmoscopic exam of optic discs and posterior segments is normal; no papilledema or  hemorrhages  MUSCULOSKELETAL: Gait, strength, tone, movements noted in Neurologic exam below  NEUROLOGIC: MENTAL STATUS:      No data to display         awake, alert, oriented to person, place and time recent and remote memory intact normal attention and concentration SLOW SPEECH; MILD DECR FLUENCY; comprehension intact, naming intact,  fund of knowledge appropriate  CRANIAL NERVE:  2nd - no papilledema on fundoscopic exam 2nd, 3rd, 4th, 6th - pupils equal and reactive to light, visual fields full to confrontation, extraocular muscles intact, no nystagmus 5th - facial sensation symmetric 7th - facial strength SYMM 8th - hearing intact 9th - palate elevates symmetrically, uvula midline 11th - shoulder shrug symmetric 12th - tongue protrusion midline  MOTOR:  normal bulk and tone, full strength in the BUE, BLE  SENSORY:  normal and symmetric to light touch, temperature, vibration  COORDINATION:  finger-nose-finger, fine finger movements SLOW MILD TREMOR WITH BUE FTN  REFLEXES:  deep tendon reflexes TRACE and symmetric  GAIT/STATION:  IN WHEELCHAIR    DIAGNOSTIC DATA (LABS, IMAGING, TESTING) - I reviewed patient records, labs, notes, testing and imaging myself where available.  Lab Results  Component Value Date   WBC 7.1 04/16/2019   HGB 15.0 04/16/2019   HCT 48.1 04/16/2019   MCV 94.5 04/16/2019   PLT 224 04/16/2019      Component Value Date/Time   NA 137 11/21/2017 1625   K 4.5 11/21/2017 1625   CL 105 11/21/2017 1625   CO2 27 11/21/2017 1625   GLUCOSE 123 (H) 11/21/2017 1625   BUN 31 (H) 11/21/2017 1625   CREATININE 2.37 (H) 11/21/2017 1625   CALCIUM 8.8 (L) 11/21/2017 1625   PROT 6.9 11/21/2017 1625   ALBUMIN 3.5 11/21/2017 1625   AST 23 11/21/2017 1625   ALT 19 11/21/2017 1625   ALKPHOS 50 11/21/2017 1625   BILITOT 0.7 11/21/2017 1625   GFRNONAA 31 (L) 11/21/2017 1625   GFRAA 36 (L) 11/21/2017 1625   Lab Results  Component Value Date    CHOL 153 02/04/2016   HDL 40 (L) 02/04/2016   LDLCALC 78 02/04/2016   TRIG 174 (H) 02/04/2016   CHOLHDL 3.8 02/04/2016   Lab Results  Component Value Date   HGBA1C 7.8 (H) 02/06/2016   Lab Results  Component Value Date   VITAMINB12 449 02/04/2016   Lab Results  Component Value Date   TSH 0.886 02/04/2016    07/02/16 A1c - 5.4  02/20/16 MRI brain [I reviewed images myself and agree with interpretation. -VRP]  1. No new intracranial abnormality. 2. Expected evolution of subacute hematoma in the right thalamus. Vasogenic edema is decreased and ventriculomegaly on prior study is resolved. 3. No progression of deep cerebral watershed and right superior cerebellar peduncle infarcts. 4. Advanced for age chronic microvascular disease. 5. New and prominent sinusitis on the right. Has the patient had nasal intubation on the right?  02/04/16 MRI brain [I reviewed images myself and agree with interpretation. -VRP]  - Evolving RIGHT thalamic hematoma. Effaced 3rd ventricle with mild acute hydrocephalus. - Bilateral sub centimeter acute watershed territory infarcts. - Moderate to severe white matter changes consistent with interstitial edema and chronic small vessel ischemic disease.  02/04/16 MRA head [I reviewed images myself and agree with interpretation. -VRP]  - No acute vascular process.  - Mildly ectatic basilar artery.  02/03/16 CT head [I reviewed images myself and agree with interpretation. -VRP]  - Interval development of intraparenchymal hemorrhage seen in the right thalamus with surrounding white matter edema. Mild dilatation of lateral ventricles is noted. Critical Value/emergent results were called by telephone at the time of interpretation on 02/03/2016 at 2:17 pm to Dr. Cathren Laine , who verbally acknowledged these Results.     ASSESSMENT AND PLAN  53 y.o. year old male here with:   Dx: right thalamic intracerebral hemorrhage due to accelerated hypertension;  stable / improving  1. Spell of abnormal behavior   2. Nontraumatic subcortical hemorrhage of right cerebral hemisphere [facility])      PLAN:  ABNORMAL SPELLS (since Jan 2025; staring spells; possible complex partial seizures) - check MRI brain  - start levetiracetam 500mg  twice a day   RIGHT THALAMIC ICH (hypertensive) --> mid residual gait,  balance, cognitive difficulty - continue BP control and lipid control  Meds ordered this encounter  Medications   levETIRAcetam (KEPPRA) 500 MG tablet    Sig: Take 1 tablet (500 mg total) by mouth 2 (two) times daily.   Orders Placed This Encounter  Procedures   MR BRAIN W WO CONTRAST   Return in about 6 months (around 09/29/2023) for MyChart visit (15 min).    Suanne Marker, MD 03/29/2023, 11:01 AM Certified in Neurology, Neurophysiology and Neuroimaging  [facility] [address] [place], [place] [address] [phone]

## 2023-03-29 NOTE — Patient Instructions (Signed)
  ABNORMAL SPELLS (staring spells; possible complex partial seizures) - check MRI brain  - start levetiracetam 500mg  twice a day   RIGHT THALAMIC ICH (hypertensive) --> mid residual gait, balance, cognitive difficulty - continue BP control and lipid control

## 2023-04-01 ENCOUNTER — Telehealth: Payer: Self-pay | Admitting: Diagnostic Neuroimaging

## 2023-04-01 NOTE — Telephone Encounter (Signed)
 no auth required sent to [facility] [phone]

## 2023-05-13 ENCOUNTER — Ambulatory Visit
Admission: RE | Admit: 2023-05-13 | Discharge: 2023-05-13 | Disposition: A | Discharge status: Home / Self Care | Source: Ambulatory Visit | Attending: Diagnostic Neuroimaging | Admitting: Diagnostic Neuroimaging

## 2023-05-13 DIAGNOSIS — R4689 Other symptoms and signs involving appearance and behavior: Secondary | ICD-10-CM

## 2023-05-13 DIAGNOSIS — I610 Nontraumatic intracerebral hemorrhage in hemisphere, subcortical: Secondary | ICD-10-CM | POA: Diagnosis not present

## 2023-05-13 MED ORDER — GADOPICLENOL 0.5 MMOL/ML IV SOLN
10.0000 mL | Freq: Once | INTRAVENOUS | Status: AC | PRN
  Administered 2023-05-13: 10 mL via INTRAVENOUS

## 2023-06-09 ENCOUNTER — Ambulatory Visit: Payer: Self-pay | Admitting: Diagnostic Neuroimaging

## 2023-10-04 ENCOUNTER — Telehealth: Admitting: Diagnostic Neuroimaging
